# Patient Record
Sex: Female | Born: 1950
Health system: Southern US, Community
[De-identification: ages and names within clinical notes are randomized; demographics above are authoritative.]

## PROBLEM LIST (undated history)

## (undated) DIAGNOSIS — T753XXA Motion sickness, initial encounter: Secondary | ICD-10-CM

## (undated) DIAGNOSIS — K219 Gastro-esophageal reflux disease without esophagitis: Secondary | ICD-10-CM

## (undated) DIAGNOSIS — E785 Hyperlipidemia, unspecified: Secondary | ICD-10-CM

## (undated) DIAGNOSIS — F411 Generalized anxiety disorder: Secondary | ICD-10-CM

## (undated) DIAGNOSIS — E039 Hypothyroidism, unspecified: Secondary | ICD-10-CM

## (undated) DIAGNOSIS — M1990 Unspecified osteoarthritis, unspecified site: Secondary | ICD-10-CM

## (undated) DIAGNOSIS — F419 Anxiety disorder, unspecified: Secondary | ICD-10-CM

## (undated) DIAGNOSIS — I2510 Atherosclerotic heart disease of native coronary artery without angina pectoris: Secondary | ICD-10-CM

## (undated) HISTORY — PX: APPENDECTOMY: SHX54

## (undated) HISTORY — PX: TONSILLECTOMY: SUR1361

## (undated) HISTORY — DX: Atherosclerotic heart disease of native coronary artery without angina pectoris: I25.10

## (undated) HISTORY — PX: JOINT REPLACEMENT: SHX530

## (undated) HISTORY — PX: TUBAL LIGATION: SHX77

## (undated) HISTORY — PX: COLONOSCOPY: SHX5424

---

## 2010-11-01 ENCOUNTER — Ambulatory Visit: Payer: Self-pay | Admitting: Internal Medicine

## 2013-09-21 ENCOUNTER — Ambulatory Visit: Payer: Self-pay | Admitting: Internal Medicine

## 2013-09-28 DIAGNOSIS — E782 Mixed hyperlipidemia: Secondary | ICD-10-CM | POA: Insufficient documentation

## 2013-11-22 DIAGNOSIS — E669 Obesity, unspecified: Secondary | ICD-10-CM | POA: Insufficient documentation

## 2013-11-22 DIAGNOSIS — G4762 Sleep related leg cramps: Secondary | ICD-10-CM | POA: Insufficient documentation

## 2015-08-04 DIAGNOSIS — K219 Gastro-esophageal reflux disease without esophagitis: Secondary | ICD-10-CM | POA: Insufficient documentation

## 2015-10-04 DIAGNOSIS — K8020 Calculus of gallbladder without cholecystitis without obstruction: Secondary | ICD-10-CM | POA: Insufficient documentation

## 2015-11-22 DIAGNOSIS — D229 Melanocytic nevi, unspecified: Secondary | ICD-10-CM | POA: Insufficient documentation

## 2016-02-07 DIAGNOSIS — E039 Hypothyroidism, unspecified: Secondary | ICD-10-CM | POA: Insufficient documentation

## 2016-02-22 HISTORY — PX: JOINT REPLACEMENT: SHX530

## 2016-03-12 DIAGNOSIS — Z96652 Presence of left artificial knee joint: Secondary | ICD-10-CM | POA: Insufficient documentation

## 2017-05-23 DIAGNOSIS — M25562 Pain in left knee: Secondary | ICD-10-CM | POA: Insufficient documentation

## 2017-05-23 DIAGNOSIS — G8929 Other chronic pain: Secondary | ICD-10-CM | POA: Insufficient documentation

## 2017-07-25 ENCOUNTER — Other Ambulatory Visit: Payer: Self-pay | Admitting: Unknown Physician Specialty

## 2017-07-25 DIAGNOSIS — Z96652 Presence of left artificial knee joint: Secondary | ICD-10-CM

## 2017-07-30 ENCOUNTER — Other Ambulatory Visit (HOSPITAL_COMMUNITY): Payer: Self-pay | Admitting: Unknown Physician Specialty

## 2017-07-30 DIAGNOSIS — Z96652 Presence of left artificial knee joint: Secondary | ICD-10-CM

## 2017-08-06 ENCOUNTER — Ambulatory Visit
Admission: RE | Admit: 2017-08-06 | Discharge: 2017-08-06 | Disposition: A | Discharge status: Home / Self Care | Payer: Medicare Other | Source: Ambulatory Visit | Attending: Unknown Physician Specialty | Admitting: Unknown Physician Specialty

## 2017-08-06 ENCOUNTER — Encounter: Payer: Self-pay | Admitting: Radiology

## 2017-08-06 DIAGNOSIS — M949 Disorder of cartilage, unspecified: Secondary | ICD-10-CM | POA: Insufficient documentation

## 2017-08-06 DIAGNOSIS — Z96652 Presence of left artificial knee joint: Secondary | ICD-10-CM | POA: Insufficient documentation

## 2017-08-06 DIAGNOSIS — M25462 Effusion, left knee: Secondary | ICD-10-CM | POA: Diagnosis not present

## 2019-03-11 LAB — HM DEXA SCAN

## 2019-03-16 LAB — HM MAMMOGRAPHY

## 2019-06-04 DIAGNOSIS — F411 Generalized anxiety disorder: Secondary | ICD-10-CM | POA: Insufficient documentation

## 2019-06-04 DIAGNOSIS — F419 Anxiety disorder, unspecified: Secondary | ICD-10-CM | POA: Insufficient documentation

## 2020-01-03 DIAGNOSIS — R011 Cardiac murmur, unspecified: Secondary | ICD-10-CM | POA: Insufficient documentation

## 2020-04-18 LAB — BASIC METABOLIC PANEL
BUN: 19 (ref 4–21)
CO2: 28 — AB (ref 13–22)
Chloride: 105 (ref 99–108)
Creatinine: 1 (ref 0.5–1.1)
Glucose: 96
Potassium: 4.4 (ref 3.4–5.3)
Sodium: 138 (ref 137–147)

## 2020-04-18 LAB — LIPID PANEL
Cholesterol: 194 (ref 0–200)
HDL: 69 (ref 35–70)
LDL Cholesterol: 106
Triglycerides: 93 (ref 40–160)

## 2020-04-18 LAB — HEPATIC FUNCTION PANEL
ALT: 18 (ref 7–35)
AST: 22 (ref 13–35)
Alkaline Phosphatase: 67 (ref 25–125)
Bilirubin, Total: 0.6

## 2020-04-18 LAB — COMPREHENSIVE METABOLIC PANEL: GFR calc non Af Amer: 56

## 2020-04-18 LAB — TSH: TSH: 3.6 (ref 0.41–5.90)

## 2020-06-02 ENCOUNTER — Other Ambulatory Visit: Payer: Self-pay

## 2020-06-02 ENCOUNTER — Ambulatory Visit
Admission: EM | Admit: 2020-06-02 | Discharge: 2020-06-02 | Disposition: A | Discharge status: Home / Self Care | Payer: Medicare Other

## 2020-06-02 DIAGNOSIS — H9202 Otalgia, left ear: Secondary | ICD-10-CM | POA: Diagnosis not present

## 2020-06-02 HISTORY — DX: Hyperlipidemia, unspecified: E78.5

## 2020-06-02 NOTE — ED Provider Notes (Signed)
Roderic Palau    CSN: [id number] Arrival date & time: 06/02/20  1322      History   Chief Complaint Chief Complaint  Patient presents with  . Otalgia    HPI Martha Walsh is a 69 y.o. female.   Patient presents with 4-day history of left ear pain.  She states she had cold symptoms earlier in the week but these have resolved.  She denies fever, chills, sore throat, cough, shortness of breath, vomiting, diarrhea, or other symptoms.  Treatment attempted at home with Tylenol.  The history is provided by the patient.    Past Medical History:  Diagnosis Date  . Hyperlipidemia     There are no problems to display for this patient.   Past Surgical History:  Procedure Laterality Date  . APPENDECTOMY    . JOINT REPLACEMENT    . TONSILLECTOMY      OB History   No obstetric history on file.      Home Medications    Prior to Admission medications   Not on File    Family History History reviewed. No pertinent family history.  Social History Social History   Tobacco Use  . Smoking status: Former Research scientist (life sciences)  . Smokeless tobacco: Never Used  Vaping Use  . Vaping Use: Never used  Substance Use Topics  . Alcohol use: Yes    Comment: occasionally  . Drug use: Not on file     Allergies   Amoxicillin-pot clavulanate and Lisinopril   Review of Systems Review of Systems  Constitutional: Negative for chills and fever.  HENT: Positive for ear pain. Negative for sore throat.   Eyes: Negative for pain and visual disturbance.  Respiratory: Negative for cough and shortness of breath.   Cardiovascular: Negative for chest pain and palpitations.  Gastrointestinal: Negative for abdominal pain, diarrhea and vomiting.  Genitourinary: Negative for dysuria and hematuria.  Musculoskeletal: Negative for arthralgias and back pain.  Skin: Negative for color change and rash.  Neurological: Negative for seizures and syncope.  All other systems reviewed and are  negative.    Physical Exam Triage Vital Signs ED Triage Vitals  Enc Vitals Group     BP      Pulse      Resp      Temp      Temp src      SpO2      Weight      Height      Head Circumference      Peak Flow      Pain Score      Pain Loc      Pain Edu?      Excl. in [place]?    No data found.  Updated Vital Signs BP 117/71   Pulse 64   Temp 98.3 F (36.8 C)   Resp 16   SpO2 96%   Visual Acuity Right Eye Distance:   Left Eye Distance:   Bilateral Distance:    Right Eye Near:   Left Eye Near:    Bilateral Near:     Physical Exam Vitals and nursing note reviewed.  Constitutional:      General: She is not in acute distress.    Appearance: She is well-developed.  HENT:     Head: Normocephalic and atraumatic.     Right Ear: Tympanic membrane and ear canal normal.     Left Ear: Tympanic membrane and ear canal normal.     Nose: Nose normal.  Mouth/Throat:     Mouth: Mucous membranes are moist.     Pharynx: Oropharynx is clear.  Eyes:     Conjunctiva/sclera: Conjunctivae normal.  Cardiovascular:     Rate and Rhythm: Normal rate and regular rhythm.     Heart sounds: No murmur heard.   Pulmonary:     Effort: Pulmonary effort is normal. No respiratory distress.     Breath sounds: Normal breath sounds.  Abdominal:     Palpations: Abdomen is soft.     Tenderness: There is no abdominal tenderness. There is no guarding or rebound.  Musculoskeletal:     Cervical back: Neck supple.  Skin:    General: Skin is warm and dry.     Findings: No rash.  Neurological:     General: No focal deficit present.     Mental Status: She is alert and oriented to person, place, and time.     Gait: Gait normal.  Psychiatric:        Mood and Affect: Mood normal.        Behavior: Behavior normal.      UC Treatments / Results  Labs (all labs ordered are listed, but only abnormal results are displayed) Labs Reviewed - No data to display  EKG   Radiology No results  found.  Procedures Procedures (including critical care time)  Medications Ordered in UC Medications - No data to display  Initial Impression / Assessment and Plan / UC Course  I have reviewed the triage vital signs and the nursing notes.  Pertinent labs & imaging results that were available during my care of the patient were reviewed by me and considered in my medical decision making (see chart for details).   Left otalgia.  Instructed patient to take Tylenol and Mucinex as needed for her symptoms.  Education provided on otalgia and eustachian tube dysfunction.  Instructed patient to follow-up with her PCP if her symptoms are not improving.  Patient agrees to plan of care.   Final Clinical Impressions(s) / UC Diagnoses   Final diagnoses:  Acute otalgia, left     Discharge Instructions     Take Mucinex as discussed.    Follow up with your primary care provider if your symptoms are not improving.       ED Prescriptions    None     PDMP not reviewed this encounter.   Sharion Balloon, NP 06/02/20 1344

## 2020-06-02 NOTE — ED Triage Notes (Signed)
Patient complains of left ear pain x4 days. Reports it only hurts when she is chewing or opens her mouth too wide.

## 2020-06-02 NOTE — Discharge Instructions (Signed)
Take Mucinex as discussed.    Follow up with your primary care provider if your symptoms are not improving.

## 2020-07-11 ENCOUNTER — Other Ambulatory Visit: Payer: Self-pay

## 2020-07-12 ENCOUNTER — Encounter: Payer: Self-pay | Admitting: Gastroenterology

## 2020-07-12 ENCOUNTER — Other Ambulatory Visit: Payer: Self-pay

## 2020-07-12 ENCOUNTER — Ambulatory Visit: Payer: Medicare Other | Admitting: Gastroenterology

## 2020-07-12 VITALS — BP 134/74 | HR 56 | Ht 64.0 in | Wt 172.8 lb

## 2020-07-12 DIAGNOSIS — K219 Gastro-esophageal reflux disease without esophagitis: Secondary | ICD-10-CM | POA: Diagnosis not present

## 2020-07-12 MED ORDER — PANTOPRAZOLE SODIUM 40 MG PO TBEC: 40.0000 mg | DELAYED_RELEASE_TABLET | Freq: Every day | ORAL | 6 refills | Status: DC

## 2020-07-12 NOTE — H&P (View-Only) (Signed)
Gastroenterology Consultation  Referring Provider:     Gale Journey, MD Primary Care Physician:  Gale Journey, MD Primary Gastroenterologist:  Dr. Allen Norris     Reason for Consultation:     GERD        HPI:   Martha Walsh is a 69 y.o. y/o female referred for consultation & management of GERD by Dr. Gale Journey, MD.  This patient comes to see me today after having a colonoscopy at [facility] back in 2019.  The patient also had a referral sent to Dr. Vira Agar back in July at [facility] but Dr. Vira Agar had already retired by that time.  The patient was then sent to [place] for evaluation of her GERD.  The patient's colonoscopy at [facility] in 2019 did not show any polyps but did show a tortuous colon.  The patient is reported to be taking omeprazole 20 mg a day for her heartburn.  At her primary care provider's office visit she was told to avoid trigger foods for her heartburn and to eat slowly. This has been of minimal help.  Past Medical History:  Diagnosis Date  . Hyperlipidemia     Past Surgical History:  Procedure Laterality Date  . APPENDECTOMY    . JOINT REPLACEMENT    . TONSILLECTOMY      Prior to Admission medications   Medication Sig Start Date End Date Taking? Authorizing Provider  amoxicillin (AMOXIL) 500 MG capsule Take 4 capsules (2000 mg) by mouth 1 hour prior to dental appointment. 02/18/17   [provider]  aspirin 81 MG EC tablet Take by mouth. 04/15/17   [provider]  azelastine (ASTELIN) 0.1 % nasal spray 1 spray into each nostril Two (2) times a day. Use in each nostril as directed 06/08/20 06/08/21  [provider]  Black Pepper-Turmeric (TURMERIC COMPLEX/BLACK PEPPER) 3-500 MG CAPS Take 1 capsule by mouth daily.    [provider]  Calcium Carbonate-Vitamin D 600-400 MG-UNIT tablet Take by mouth. 09/29/12   [provider]  Cholecalciferol 25 MCG (1000 UT) tablet Take by mouth.    [provider]  citalopram  (CELEXA) 10 MG tablet Take 10 mg by mouth daily. 05/14/20   [provider]  clonazePAM (KLONOPIN) 0.5 MG tablet Take by mouth. 03/03/19   [provider]  ezetimibe (ZETIA) 10 MG tablet Take 10 mg by mouth daily. 03/09/20   [provider]  ibuprofen (ADVIL) 600 MG tablet Take by mouth. 06/08/20 06/08/21  [provider]  levothyroxine (SYNTHROID) 25 MCG tablet Take by mouth. 04/03/20   [provider]  Omega-3 1000 MG CAPS Take by mouth.    [provider]  omeprazole (PRILOSEC) 20 MG capsule Take by mouth. 03/29/10   [provider]  potassium chloride (KLOR-CON) 10 MEQ tablet Take by mouth.    [provider]  pravastatin (PRAVACHOL) 80 MG tablet SMARTSIG:1 Tablet(s) By Mouth Every Evening 02/28/20   [provider]    No family history on file.   Social History   Tobacco Use  . Smoking status: Former Research scientist (life sciences)  . Smokeless tobacco: Never Used  Vaping Use  . Vaping Use: Never used  Substance Use Topics  . Alcohol use: Yes    Comment: occasionally  . Drug use: Not on file    Allergies as of 07/12/2020 - Review Complete 06/02/2020  Allergen Reaction Noted  . Amoxicillin-pot clavulanate Other (See Comments) 11/22/2013  . Lisinopril Cough 09/10/2016    Review of  Systems:    All systems reviewed and negative except where noted in HPI.   Physical Exam:  There were no vitals taken for this visit. No LMP recorded. Patient is postmenopausal. General:   Alert,  Well-developed, well-nourished, pleasant and cooperative in NAD Head:  Normocephalic and atraumatic. Eyes:  Sclera clear, no icterus.   Conjunctiva pink. Ears:  Normal auditory acuity. Neck:  Supple; no masses or thyromegaly. Lungs:  Respirations even and unlabored.  Clear throughout to auscultation.   No wheezes, crackles, or rhonchi. No acute distress. Heart:  Regular rate and rhythm; no murmurs, clicks, rubs, or gallops. Abdomen:  Normal bowel sounds.   No bruits.  Soft, non-tender and non-distended without masses, hepatosplenomegaly or hernias noted.  No guarding or rebound tenderness.  Negative Carnett sign.   Rectal:  Deferred.  Pulses:  Normal pulses noted. Extremities:  No clubbing or edema.  No cyanosis. Neurologic:  Alert and oriented x3;  grossly normal neurologically. Skin:  Intact without significant lesions or rashes.  No jaundice. Lymph Nodes:  No significant cervical adenopathy. Psych:  Alert and cooperative. Normal mood and affect.  Imaging Studies: No results found.  Assessment and Plan:   Martha Walsh is a 69 y.o. y/o female who comes in with GERD not being controlled by omeprazole. The patient denies any worry symptoms.due to the long standing heart burn and acid breakthrough. The patient will be set up for an EGD and she will also be started on Protonix. The patient has been explained the plan and agrees with it.    Lucilla Lame, MD. Marval Regal    Note: This dictation was prepared with Dragon dictation along with smaller phrase technology. Any transcriptional errors that result from this process are unintentional.

## 2020-07-12 NOTE — Progress Notes (Addendum)
Gastroenterology Consultation  Referring Provider:     Gale Journey, MD Primary Care Physician:  Gale Journey, MD Primary Gastroenterologist:  Dr. Allen Norris     Reason for Consultation:     GERD        HPI:   Martha Walsh is a 69 y.o. y/o female referred for consultation & management of GERD by Dr. Gale Journey, MD.  This patient comes to see me today after having a colonoscopy at [facility] back in 2019.  The patient also had a referral sent to Dr. Vira Agar back in July at [facility] but Dr. Vira Agar had already retired by that time.  The patient was then sent to [place] for evaluation of her GERD.  The patient's colonoscopy at [facility] in 2019 did not show any polyps but did show a tortuous colon.  The patient is reported to be taking omeprazole 20 mg a day for her heartburn.  At her primary care provider's office visit she was told to avoid trigger foods for her heartburn and to eat slowly. This has been of minimal help.  Past Medical History:  Diagnosis Date  . Hyperlipidemia     Past Surgical History:  Procedure Laterality Date  . APPENDECTOMY    . JOINT REPLACEMENT    . TONSILLECTOMY      Prior to Admission medications   Medication Sig Start Date End Date Taking? Authorizing Provider  amoxicillin (AMOXIL) 500 MG capsule Take 4 capsules (2000 mg) by mouth 1 hour prior to dental appointment. 02/18/17   [provider]  aspirin 81 MG EC tablet Take by mouth. 04/15/17   [provider]  azelastine (ASTELIN) 0.1 % nasal spray 1 spray into each nostril Two (2) times a day. Use in each nostril as directed 06/08/20 06/08/21  [provider]  Black Pepper-Turmeric (TURMERIC COMPLEX/BLACK PEPPER) 3-500 MG CAPS Take 1 capsule by mouth daily.    [provider]  Calcium Carbonate-Vitamin D 600-400 MG-UNIT tablet Take by mouth. 09/29/12   [provider]  Cholecalciferol 25 MCG (1000 UT) tablet Take by mouth.    [provider]  citalopram  (CELEXA) 10 MG tablet Take 10 mg by mouth daily. 05/14/20   [provider]  clonazePAM (KLONOPIN) 0.5 MG tablet Take by mouth. 03/03/19   [provider]  ezetimibe (ZETIA) 10 MG tablet Take 10 mg by mouth daily. 03/09/20   [provider]  ibuprofen (ADVIL) 600 MG tablet Take by mouth. 06/08/20 06/08/21  [provider]  levothyroxine (SYNTHROID) 25 MCG tablet Take by mouth. 04/03/20   [provider]  Omega-3 1000 MG CAPS Take by mouth.    [provider]  omeprazole (PRILOSEC) 20 MG capsule Take by mouth. 03/29/10   [provider]  potassium chloride (KLOR-CON) 10 MEQ tablet Take by mouth.    [provider]  pravastatin (PRAVACHOL) 80 MG tablet SMARTSIG:1 Tablet(s) By Mouth Every Evening 02/28/20   [provider]    No family history on file.   Social History   Tobacco Use  . Smoking status: Former Research scientist (life sciences)  . Smokeless tobacco: Never Used  Vaping Use  . Vaping Use: Never used  Substance Use Topics  . Alcohol use: Yes    Comment: occasionally  . Drug use: Not on file    Allergies as of 07/12/2020 - Review Complete 06/02/2020  Allergen Reaction Noted  . Amoxicillin-pot clavulanate Other (See Comments) 11/22/2013  . Lisinopril Cough 09/10/2016    Review of  Systems:    All systems reviewed and negative except where noted in HPI.   Physical Exam:  There were no vitals taken for this visit. No LMP recorded. Patient is postmenopausal. General:   Alert,  Well-developed, well-nourished, pleasant and cooperative in NAD Head:  Normocephalic and atraumatic. Eyes:  Sclera clear, no icterus.   Conjunctiva pink. Ears:  Normal auditory acuity. Neck:  Supple; no masses or thyromegaly. Lungs:  Respirations even and unlabored.  Clear throughout to auscultation.   No wheezes, crackles, or rhonchi. No acute distress. Heart:  Regular rate and rhythm; no murmurs, clicks, rubs, or gallops. Abdomen:  Normal bowel sounds.   No bruits.  Soft, non-tender and non-distended without masses, hepatosplenomegaly or hernias noted.  No guarding or rebound tenderness.  Negative Carnett sign.   Rectal:  Deferred.  Pulses:  Normal pulses noted. Extremities:  No clubbing or edema.  No cyanosis. Neurologic:  Alert and oriented x3;  grossly normal neurologically. Skin:  Intact without significant lesions or rashes.  No jaundice. Lymph Nodes:  No significant cervical adenopathy. Psych:  Alert and cooperative. Normal mood and affect.  Imaging Studies: No results found.  Assessment and Plan:   Martha Walsh is a 69 y.o. y/o female who comes in with GERD not being controlled by omeprazole. The patient denies any worry symptoms.due to the long standing heart burn and acid breakthrough. The patient will be set up for an EGD and she will also be started on Protonix. The patient has been explained the plan and agrees with it.    Lucilla Lame, MD. Marval Regal    Note: This dictation was prepared with Dragon dictation along with smaller phrase technology. Any transcriptional errors that result from this process are unintentional.

## 2020-07-13 ENCOUNTER — Other Ambulatory Visit: Payer: Self-pay

## 2020-07-13 DIAGNOSIS — K219 Gastro-esophageal reflux disease without esophagitis: Secondary | ICD-10-CM

## 2020-07-20 ENCOUNTER — Encounter: Payer: Self-pay | Admitting: Gastroenterology

## 2020-07-25 ENCOUNTER — Other Ambulatory Visit
Admission: RE | Admit: 2020-07-25 | Discharge: 2020-07-25 | Disposition: A | Discharge status: Home / Self Care | Payer: Medicare Other | Source: Ambulatory Visit | Attending: Gastroenterology | Admitting: Gastroenterology

## 2020-07-25 ENCOUNTER — Other Ambulatory Visit: Payer: Self-pay

## 2020-07-25 DIAGNOSIS — Z01812 Encounter for preprocedural laboratory examination: Secondary | ICD-10-CM | POA: Insufficient documentation

## 2020-07-25 DIAGNOSIS — Z20822 Contact with and (suspected) exposure to covid-19: Secondary | ICD-10-CM | POA: Diagnosis not present

## 2020-07-25 LAB — SARS CORONAVIRUS 2 (TAT 6-24 HRS): SARS Coronavirus 2: NEGATIVE

## 2020-07-26 NOTE — Discharge Instructions (Signed)
General Anesthesia, Adult, Care After This sheet gives you information about how to care for yourself after your procedure. Your health care provider may also give you more specific instructions. If you have problems or questions, contact your health care provider. What can I expect after the procedure? After the procedure, the following side effects are common:  Pain or discomfort at the IV site.  Nausea.  Vomiting.  Sore throat.  Trouble concentrating.  Feeling cold or chills.  Weak or tired.  Sleepiness and fatigue.  Soreness and body aches. These side effects can affect parts of the body that were not involved in surgery. Follow these instructions at home:  For at least 24 hours after the procedure:  Have a responsible adult stay with you. It is important to have someone help care for you until you are awake and alert.  Rest as needed.  Do not: ? Participate in activities in which you could fall or become injured. ? Drive. ? Use heavy machinery. ? Drink alcohol. ? Take sleeping pills or medicines that cause drowsiness. ? Make important decisions or sign legal documents. ? Take care of children on your own. Eating and drinking  Follow any instructions from your health care provider about eating or drinking restrictions.  When you feel hungry, start by eating small amounts of foods that are soft and easy to digest (bland), such as toast. Gradually return to your regular diet.  Drink enough fluid to keep your urine pale yellow.  If you vomit, rehydrate by drinking water, juice, or clear broth. General instructions  If you have sleep apnea, surgery and certain medicines can increase your risk for breathing problems. Follow instructions from your health care provider about wearing your sleep device: ? Anytime you are sleeping, including during daytime naps. ? While taking prescription pain medicines, sleeping medicines, or medicines that make you drowsy.  Return to  your normal activities as told by your health care provider. Ask your health care provider what activities are safe for you.  Take over-the-counter and prescription medicines only as told by your health care provider.  If you smoke, do not smoke without supervision.  Keep all follow-up visits as told by your health care provider. This is important. Contact a health care provider if:  You have nausea or vomiting that does not get better with medicine.  You cannot eat or drink without vomiting.  You have pain that does not get better with medicine.  You are unable to pass urine.  You develop a skin rash.  You have a fever.  You have redness around your IV site that gets worse. Get help right away if:  You have difficulty breathing.  You have chest pain.  You have blood in your urine or stool, or you vomit blood. Summary  After the procedure, it is common to have a sore throat or nausea. It is also common to feel tired.  Have a responsible adult stay with you for the first 24 hours after general anesthesia. It is important to have someone help care for you until you are awake and alert.  When you feel hungry, start by eating small amounts of foods that are soft and easy to digest (bland), such as toast. Gradually return to your regular diet.  Drink enough fluid to keep your urine pale yellow.  Return to your normal activities as told by your health care provider. Ask your health care provider what activities are safe for you. This information is not   intended to replace advice given to you by your health care provider. Make sure you discuss any questions you have with your health care provider. Document Revised: 09/12/2017 Document Reviewed: 04/25/2017 Elsevier Patient Education  2020 Elsevier Inc.  

## 2020-07-27 ENCOUNTER — Encounter
Admission: RE | Disposition: A | Discharge status: Home / Self Care | Payer: Self-pay | Source: Home / Self Care | Attending: Gastroenterology

## 2020-07-27 ENCOUNTER — Encounter: Payer: Self-pay | Admitting: Gastroenterology

## 2020-07-27 ENCOUNTER — Other Ambulatory Visit: Payer: Self-pay

## 2020-07-27 ENCOUNTER — Ambulatory Visit: Payer: Medicare Other | Admitting: Anesthesiology

## 2020-07-27 ENCOUNTER — Ambulatory Visit
Admission: RE | Admit: 2020-07-27 | Discharge: 2020-07-27 | Disposition: A | Discharge status: Home / Self Care | Payer: Medicare Other | Attending: Gastroenterology | Admitting: Gastroenterology

## 2020-07-27 DIAGNOSIS — Z87891 Personal history of nicotine dependence: Secondary | ICD-10-CM | POA: Insufficient documentation

## 2020-07-27 DIAGNOSIS — R12 Heartburn: Secondary | ICD-10-CM | POA: Diagnosis present

## 2020-07-27 DIAGNOSIS — K219 Gastro-esophageal reflux disease without esophagitis: Secondary | ICD-10-CM | POA: Diagnosis not present

## 2020-07-27 DIAGNOSIS — Z7982 Long term (current) use of aspirin: Secondary | ICD-10-CM | POA: Diagnosis not present

## 2020-07-27 DIAGNOSIS — Z79899 Other long term (current) drug therapy: Secondary | ICD-10-CM | POA: Diagnosis not present

## 2020-07-27 HISTORY — DX: Hypothyroidism, unspecified: E03.9

## 2020-07-27 HISTORY — DX: Gastro-esophageal reflux disease without esophagitis: K21.9

## 2020-07-27 HISTORY — PX: ESOPHAGOGASTRODUODENOSCOPY (EGD) WITH PROPOFOL: SHX5813

## 2020-07-27 SURGERY — ESOPHAGOGASTRODUODENOSCOPY (EGD) WITH PROPOFOL
Anesthesia: General

## 2020-07-27 MED ORDER — SODIUM CHLORIDE 0.9 % IV SOLN: INTRAVENOUS | Status: DC

## 2020-07-27 MED ORDER — PROPOFOL 10 MG/ML IV BOLUS
INTRAVENOUS | Status: DC | PRN
  Administered 2020-07-27 (×2): 60 mg via INTRAVENOUS

## 2020-07-27 MED ORDER — ACETAMINOPHEN 325 MG PO TABS: 325.0000 mg | ORAL_TABLET | ORAL | Status: DC | PRN

## 2020-07-27 MED ORDER — LIDOCAINE HCL (CARDIAC) PF 100 MG/5ML IV SOSY
PREFILLED_SYRINGE | INTRAVENOUS | Status: DC | PRN
  Administered 2020-07-27: 50 mg via INTRAVENOUS

## 2020-07-27 MED ORDER — ACETAMINOPHEN 160 MG/5ML PO SOLN: 325.0000 mg | ORAL | Status: DC | PRN

## 2020-07-27 MED ORDER — LACTATED RINGERS IV SOLN: INTRAVENOUS | Status: DC

## 2020-07-27 MED ORDER — GLYCOPYRROLATE 0.2 MG/ML IJ SOLN
INTRAMUSCULAR | Status: DC | PRN
  Administered 2020-07-27: .2 mg via INTRAVENOUS

## 2020-07-27 MED ORDER — ONDANSETRON HCL 4 MG/2ML IJ SOLN
4.0000 mg | Freq: Once | INTRAMUSCULAR | Status: AC | PRN
  Administered 2020-07-27: 4 mg via INTRAVENOUS

## 2020-07-27 SURGICAL SUPPLY — 19 items
BALLN DILATOR 10-12 8 (BALLOONS)
BALLN DILATOR 12-15 8 (BALLOONS)
BALLN DILATOR 15-18 8 (BALLOONS)
BALLN DILATOR CRE 0-12 8 (BALLOONS)
BALLN DILATOR ESOPH 8 10 CRE (MISCELLANEOUS) IMPLANT
BALLOON DILATOR 12-15 8 (BALLOONS) IMPLANT
BALLOON DILATOR 15-18 8 (BALLOONS) IMPLANT
BALLOON DILATOR CRE 0-12 8 (BALLOONS) IMPLANT
BLOCK BITE 60FR ADLT L/F GRN (MISCELLANEOUS) ×2 IMPLANT
FCP ESCP3.2XJMB 240X2.8X (MISCELLANEOUS)
FORCEPS BIOP RAD 4 LRG CAP 4 (CUTTING FORCEPS) IMPLANT
FORCEPS BIOP RJ4 240 W/NDL (MISCELLANEOUS)
FORCEPS ESCP3.2XJMB 240X2.8X (MISCELLANEOUS) IMPLANT
GOWN CVR UNV OPN BCK APRN NK (MISCELLANEOUS) ×2 IMPLANT
GOWN ISOL THUMB LOOP REG UNIV (MISCELLANEOUS) ×4
MANIFOLD NEPTUNE II (INSTRUMENTS) ×2 IMPLANT
SYR INFLATION 60ML (SYRINGE) IMPLANT
WATER STERILE IRR 250ML POUR (IV SOLUTION) ×2 IMPLANT
WIRE CRE 18-20MM 8CM F G (MISCELLANEOUS) IMPLANT

## 2020-07-27 NOTE — Anesthesia Postprocedure Evaluation (Signed)
Anesthesia Post Note  Patient: Martha Walsh  Procedure(s) Performed: ESOPHAGOGASTRODUODENOSCOPY (EGD) WITH PROPOFOL (N/A )     Patient location during evaluation: PACU Anesthesia Type: General Level of consciousness: awake Pain management: pain level controlled Vital Signs Assessment: post-procedure vital signs reviewed and stable Respiratory status: respiratory function stable Cardiovascular status: stable Postop Assessment: no signs of nausea or vomiting Anesthetic complications: no   No complications documented.  Veda Canning

## 2020-07-27 NOTE — Interval H&P Note (Signed)
Lucilla Lame, MD [facility] [address], [place],  [address] Phone:[phone] Fax : [phone]  Primary Care Physician:  Gale Journey, MD Primary Gastroenterologist:  Dr. Allen Norris  Pre-Procedure History & Physical: HPI:  Martha Walsh is a 69 y.o. female is here for an endoscopy.   Past Medical History:  Diagnosis Date   GERD (gastroesophageal reflux disease)    Hyperlipidemia    Hypothyroid     Past Surgical History:  Procedure Laterality Date   APPENDECTOMY     JOINT REPLACEMENT     TONSILLECTOMY      Prior to Admission medications   Medication Sig Start Date End Date Taking? Authorizing Provider  aspirin 81 MG EC tablet Take by mouth. 04/15/17  Yes [provider]  azelastine (ASTELIN) 0.1 % nasal spray 1 spray into each nostril Two (2) times a day. Use in each nostril as directed 06/08/20 06/08/21 Yes [provider]  Black Pepper-Turmeric (TURMERIC COMPLEX/BLACK PEPPER) 3-500 MG CAPS Take 1 capsule by mouth daily.   Yes [provider]  Calcium Carbonate-Vitamin D 600-400 MG-UNIT tablet Take by mouth. 09/29/12  Yes [provider]  Cholecalciferol 25 MCG (1000 UT) tablet Take by mouth.   Yes [provider]  citalopram (CELEXA) 10 MG tablet Take 10 mg by mouth daily. 05/14/20  Yes [provider]  clonazePAM (KLONOPIN) 0.5 MG tablet Take by mouth. 03/03/19  Yes [provider]  ezetimibe (ZETIA) 10 MG tablet Take 10 mg by mouth daily. 03/09/20  Yes [provider]  ibuprofen (ADVIL) 600 MG tablet Take by mouth. 06/08/20 06/08/21 Yes [provider]  levothyroxine (SYNTHROID) 25 MCG tablet Take by mouth. 04/03/20  Yes [provider]  pantoprazole (PROTONIX) 40 MG tablet Take 1 tablet (40 mg total) by mouth daily. 07/12/20  Yes Lucilla Lame, MD  potassium chloride (KLOR-CON) 10 MEQ tablet Take by mouth.   Yes [provider]  pravastatin (PRAVACHOL) 80 MG tablet  SMARTSIG:1 Tablet(s) By Mouth Every Evening 02/28/20  Yes [provider]  Omega-3 1000 MG CAPS Take by mouth. Patient not taking: Reported on 07/20/2020    [provider]  omeprazole (PRILOSEC) 20 MG capsule Take by mouth. Patient not taking: Reported on 07/20/2020 03/29/10   [provider]    Allergies as of 07/13/2020 - Review Complete 07/12/2020  Allergen Reaction Noted   Amoxicillin-pot clavulanate Other (See Comments) 11/22/2013   Lisinopril Cough 09/10/2016    History reviewed. No pertinent family history.  Social History   Socioeconomic History   Marital status: Married    Spouse name: Not on file   Number of children: Not on file   Years of education: Not on file   Highest education level: Not on file  Occupational History   Not on file  Tobacco Use   Smoking status: Former Smoker   Smokeless tobacco: Never Used  Scientific laboratory technician Use: Never used  Substance and Sexual Activity   Alcohol use: Yes    Comment: occasionally   Drug use: Not on file   Sexual activity: Not on file  Other Topics Concern   Not on file  Social History Narrative   Not on file   Social Determinants of Health   Financial Resource Strain:    Difficulty of Paying Living Expenses: Not on file  Food Insecurity:    Worried About [place] in the Last Year: Not on file   Ran Out of Food in the  Last Year: Not on file  Transportation Needs:    Lack of Transportation (Medical): Not on file   Lack of Transportation (Non-Medical): Not on file  Physical Activity:    Days of Exercise per Week: Not on file   Minutes of Exercise per Session: Not on file  Stress:    Feeling of Stress : Not on file  Social Connections:    Frequency of Communication with Friends and Family: Not on file   Frequency of Social Gatherings with Friends and Family: Not on file   Attends Religious Services: Not on file   Active Member of Clubs or Organizations: Not on file   Attends  Archivist Meetings: Not on file   Marital Status: Not on file  Intimate Partner Violence:    Fear of Current or Ex-Partner: Not on file   Emotionally Abused: Not on file   Physically Abused: Not on file   Sexually Abused: Not on file    Review of Systems: See HPI, otherwise negative ROS  Physical Exam: BP 123/74   Pulse 79   Temp 97.6 F (36.4 C)   Ht 5\' 4"  (1.626 m)   Wt 78 kg   SpO2 97%   BMI 29.52 kg/m  General:   Alert,  pleasant and cooperative in NAD Head:  Normocephalic and atraumatic. Neck:  Supple; no masses or thyromegaly. Lungs:  Clear throughout to auscultation.    Heart:  Regular rate and rhythm. Abdomen:  Soft, nontender and nondistended. Normal bowel sounds, without guarding, and without rebound.   Neurologic:  Alert and  oriented x4;  grossly normal neurologically.  Impression/Plan: Martha Walsh is here for an endoscopy to be performed for GERD  Risks, benefits, limitations, and alternatives regarding  endoscopy have been reviewed with the patient.  Questions have been answered.  All parties agreeable.   Lucilla Lame, MD  07/27/2020, 9:33 AM

## 2020-07-27 NOTE — Anesthesia Procedure Notes (Signed)
Procedure Name: MAC Date/Time: 07/27/2020 10:16 AM Performed by: Silvana Newness, CRNA Pre-anesthesia Checklist: Patient identified, Emergency Drugs available, Suction available, Patient being monitored and Timeout performed Patient Re-evaluated:Patient Re-evaluated prior to induction Oxygen Delivery Method: Nasal cannula Placement Confirmation: positive ETCO2

## 2020-07-27 NOTE — Op Note (Signed)
[facility] Patient Name: Martha Walsh Procedure Date: 07/27/2020 10:05 AM MRN: [id number] Account #: [id number] Date of Birth: 1951-07-10 Admit Type: Outpatient Age: 69 Room: [facility] OR ROOM 01 Gender: Female Note Status: Finalized Procedure:             Upper GI endoscopy Indications:           Suspected gastro-esophageal reflux disease Providers:             Lucilla Lame MD, MD Medicines:             Propofol per Anesthesia Complications:         No immediate complications. Procedure:             Pre-Anesthesia Assessment:                        - Prior to the procedure, a History and Physical was                         performed, and patient medications and allergies were                         reviewed. The patient's tolerance of previous                         anesthesia was also reviewed. The risks and benefits                         of the procedure and the sedation options and risks                         were discussed with the patient. All questions were                         answered, and informed consent was obtained. Prior                         Anticoagulants: The patient has taken no previous                         anticoagulant or antiplatelet agents. ASA Grade                         Assessment: II - A patient with mild systemic disease.                         After reviewing the risks and benefits, the patient                         was deemed in satisfactory condition to undergo the                         procedure.                        After obtaining informed consent, the endoscope was                         passed under direct vision. Throughout the procedure,  the patient's blood pressure, pulse, and oxygen                         saturations were monitored continuously. The was                         introduced through the mouth, and advanced to the                         second part of  duodenum. The upper GI endoscopy was                         accomplished without difficulty. The patient tolerated                         the procedure well. Findings:      The examined esophagus was normal.      The stomach was normal.      The examined duodenum was normal. Impression:            - Normal esophagus.                        - Normal stomach.                        - Normal examined duodenum.                        - No specimens collected. Recommendation:        - Discharge patient to home.                        - Resume previous diet.                        - Continue present medications. Procedure Code(s):     --- Professional ---                        [phone], Esophagogastroduodenoscopy, flexible,                         transoral; diagnostic, including collection of                         specimen(s) by brushing or washing, when performed                         (separate procedure) CPT copyright 2019 American Medical Association. All rights reserved. The codes documented in this report are preliminary and upon coder review may  be revised to meet current compliance requirements. Lucilla Lame MD, MD 07/27/2020 53:61:44 AM This report has been signed electronically. Number of Addenda: 0 Note Initiated On: 07/27/2020 10:05 AM Total Procedure Duration: 0 hours 2 minutes 23 seconds  Estimated Blood Loss:  Estimated blood loss: none.      [facility]

## 2020-07-27 NOTE — Transfer of Care (Signed)
Immediate Anesthesia Transfer of Care Note  Patient: Martha Walsh  Procedure(s) Performed: ESOPHAGOGASTRODUODENOSCOPY (EGD) WITH PROPOFOL (N/A )  Patient Location: PACU  Anesthesia Type: General  Level of Consciousness: awake, alert  and patient cooperative  Airway and Oxygen Therapy: Patient Spontanous Breathing and Patient connected to supplemental oxygen  Post-op Assessment: Post-op Vital signs reviewed, Patient's Cardiovascular Status Stable, Respiratory Function Stable, Patent Airway and No signs of Nausea or vomiting  Post-op Vital Signs: Reviewed and stable  Complications: No complications documented.

## 2020-07-27 NOTE — Anesthesia Preprocedure Evaluation (Signed)
Anesthesia Evaluation  Patient identified by MRN, date of birth, ID band Patient awake    Reviewed: Allergy & Precautions, NPO status   Airway Mallampati: II  TM Distance: >3 FB     Dental   Pulmonary former smoker,    breath sounds clear to auscultation       Cardiovascular  Rhythm:Regular Rate:Normal  HLD   Neuro/Psych Anxiety    GI/Hepatic GERD  ,  Endo/Other  Hypothyroidism   Renal/GU      Musculoskeletal   Abdominal   Peds  Hematology   Anesthesia Other Findings   Reproductive/Obstetrics                             Anesthesia Physical Anesthesia Plan  ASA: II  Anesthesia Plan: General   Post-op Pain Management:    Induction: Intravenous  PONV Risk Score and Plan: Propofol infusion, TIVA and Treatment may vary due to age or medical condition  Airway Management Planned: Natural Airway and Nasal Cannula  Additional Equipment:   Intra-op Plan:   Post-operative Plan:   Informed Consent: I have reviewed the patients History and Physical, chart, labs and discussed the procedure including the risks, benefits and alternatives for the proposed anesthesia with the patient or authorized representative who has indicated his/her understanding and acceptance.       Plan Discussed with: CRNA  Anesthesia Plan Comments:         Anesthesia Quick Evaluation

## 2020-07-28 ENCOUNTER — Encounter: Payer: Self-pay | Admitting: Gastroenterology

## 2020-07-29 ENCOUNTER — Other Ambulatory Visit: Payer: Self-pay

## 2020-07-29 ENCOUNTER — Emergency Department (HOSPITAL_COMMUNITY)
Admission: EM | Admit: 2020-07-29 | Discharge: 2020-07-29 | Disposition: A | Discharge status: Home / Self Care | Payer: Medicare Other | Attending: Emergency Medicine | Admitting: Emergency Medicine

## 2020-07-29 ENCOUNTER — Emergency Department (HOSPITAL_COMMUNITY): Payer: Medicare Other

## 2020-07-29 DIAGNOSIS — W228XXA Striking against or struck by other objects, initial encounter: Secondary | ICD-10-CM | POA: Diagnosis not present

## 2020-07-29 DIAGNOSIS — E039 Hypothyroidism, unspecified: Secondary | ICD-10-CM | POA: Insufficient documentation

## 2020-07-29 DIAGNOSIS — Y9369 Activity, other involving other sports and athletics played as a team or group: Secondary | ICD-10-CM | POA: Insufficient documentation

## 2020-07-29 DIAGNOSIS — Z96652 Presence of left artificial knee joint: Secondary | ICD-10-CM | POA: Insufficient documentation

## 2020-07-29 DIAGNOSIS — S0181XA Laceration without foreign body of other part of head, initial encounter: Secondary | ICD-10-CM | POA: Diagnosis not present

## 2020-07-29 DIAGNOSIS — Z23 Encounter for immunization: Secondary | ICD-10-CM | POA: Diagnosis not present

## 2020-07-29 DIAGNOSIS — Z7982 Long term (current) use of aspirin: Secondary | ICD-10-CM | POA: Insufficient documentation

## 2020-07-29 DIAGNOSIS — Z79899 Other long term (current) drug therapy: Secondary | ICD-10-CM | POA: Diagnosis not present

## 2020-07-29 DIAGNOSIS — Y9289 Other specified places as the place of occurrence of the external cause: Secondary | ICD-10-CM | POA: Insufficient documentation

## 2020-07-29 DIAGNOSIS — S022XXA Fracture of nasal bones, initial encounter for closed fracture: Secondary | ICD-10-CM | POA: Diagnosis not present

## 2020-07-29 DIAGNOSIS — Z87891 Personal history of nicotine dependence: Secondary | ICD-10-CM | POA: Diagnosis not present

## 2020-07-29 DIAGNOSIS — R040 Epistaxis: Secondary | ICD-10-CM | POA: Diagnosis present

## 2020-07-29 MED ORDER — LIDOCAINE HCL (PF) 1 % IJ SOLN
5.0000 mL | Freq: Once | INTRAMUSCULAR | Status: AC
  Administered 2020-07-29: 5 mL
  Filled 2020-07-29: qty 5

## 2020-07-29 MED ORDER — TETANUS-DIPHTH-ACELL PERTUSSIS 5-2.5-18.5 LF-MCG/0.5 IM SUSY
0.5000 mL | PREFILLED_SYRINGE | Freq: Once | INTRAMUSCULAR | Status: AC
  Administered 2020-07-29: 0.5 mL via INTRAMUSCULAR
  Filled 2020-07-29: qty 0.5

## 2020-07-29 NOTE — Discharge Instructions (Signed)
Have the stitches taken out in around 5 days.  You could follow-up with either our ear nose and throat doctors or someone closer to home for the nasal bone fracture.
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
## 2020-07-29 NOTE — ED Triage Notes (Signed)
20 g. Left AC.
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
## 2020-11-29 DIAGNOSIS — M9901 Segmental and somatic dysfunction of cervical region: Secondary | ICD-10-CM | POA: Diagnosis not present

## 2020-11-29 DIAGNOSIS — M9902 Segmental and somatic dysfunction of thoracic region: Secondary | ICD-10-CM | POA: Diagnosis not present

## 2020-11-29 DIAGNOSIS — M5033 Other cervical disc degeneration, cervicothoracic region: Secondary | ICD-10-CM | POA: Diagnosis not present

## 2020-11-29 DIAGNOSIS — M62830 Muscle spasm of back: Secondary | ICD-10-CM | POA: Diagnosis not present

## 2020-11-30 ENCOUNTER — Other Ambulatory Visit: Payer: Self-pay | Admitting: Internal Medicine

## 2020-12-01 ENCOUNTER — Encounter: Payer: Self-pay | Admitting: Internal Medicine

## 2020-12-01 ENCOUNTER — Other Ambulatory Visit: Payer: Self-pay

## 2020-12-01 ENCOUNTER — Ambulatory Visit (INDEPENDENT_AMBULATORY_CARE_PROVIDER_SITE_OTHER): Payer: Medicare Other | Admitting: Internal Medicine

## 2020-12-01 VITALS — BP 100/66 | HR 76 | Temp 98.0°F | Ht 64.0 in | Wt 176.0 lb

## 2020-12-01 DIAGNOSIS — K219 Gastro-esophageal reflux disease without esophagitis: Secondary | ICD-10-CM

## 2020-12-01 DIAGNOSIS — E039 Hypothyroidism, unspecified: Secondary | ICD-10-CM | POA: Diagnosis not present

## 2020-12-01 DIAGNOSIS — E782 Mixed hyperlipidemia: Secondary | ICD-10-CM | POA: Diagnosis not present

## 2020-12-01 DIAGNOSIS — M1711 Unilateral primary osteoarthritis, right knee: Secondary | ICD-10-CM | POA: Diagnosis not present

## 2020-12-01 DIAGNOSIS — Z13820 Encounter for screening for osteoporosis: Secondary | ICD-10-CM

## 2020-12-01 DIAGNOSIS — Z1231 Encounter for screening mammogram for malignant neoplasm of breast: Secondary | ICD-10-CM

## 2020-12-01 DIAGNOSIS — F419 Anxiety disorder, unspecified: Secondary | ICD-10-CM

## 2020-12-01 HISTORY — DX: Unilateral primary osteoarthritis, right knee: M17.11

## 2020-12-01 MED ORDER — EZETIMIBE 10 MG PO TABS: 10.0000 mg | ORAL_TABLET | Freq: Every day | ORAL | 1 refills | Status: DC

## 2020-12-01 MED ORDER — PRAVASTATIN SODIUM 80 MG PO TABS: 80.0000 mg | ORAL_TABLET | Freq: Every day | ORAL | 1 refills | Status: DC

## 2020-12-01 MED ORDER — CITALOPRAM HYDROBROMIDE 10 MG PO TABS: 10.0000 mg | ORAL_TABLET | Freq: Every day | ORAL | 1 refills | Status: DC

## 2020-12-01 MED ORDER — MELOXICAM 15 MG PO TABS
15.0000 mg | ORAL_TABLET | Freq: Every day | ORAL | 1 refills | Status: DC
Start: 2020-12-01 — End: 2021-10-08

## 2020-12-01 NOTE — Progress Notes (Signed)
Date:  12/01/2020   Name:  Martha Walsh   DOB:  01-Nov-1950   MRN:  [id number]   Chief Complaint: Establish Care and Leg Pain (X4 months, Right leg, feels better today, comes and goes, calf area )  Thyroid Problem Presents for follow-up (onset 5 years) visit. Patient reports no anxiety, constipation, depressed mood, diarrhea, leg swelling, palpitations, weight gain or weight loss. The symptoms have been stable. Her past medical history is significant for hyperlipidemia.  Hyperlipidemia This is a chronic problem. The problem is controlled. Pertinent negatives include no chest pain or shortness of breath. Current antihyperlipidemic treatment includes statins. The current treatment provides significant improvement of lipids.  Gastroesophageal Reflux She complains of heartburn. She reports no abdominal pain or no chest pain. This is a recurrent problem. The problem occurs occasionally. Pertinent negatives include no weight loss. She has tried a PPI for the symptoms. Past procedures include an EGD (normal in 07/2020).  Anxiety Presents for follow-up visit. Patient reports no chest pain, depressed mood, dizziness, nervous/anxious behavior, palpitations or shortness of breath. Symptoms occur rarely.   Compliance with medications is 76-100% (celexa daily).  Knee Pain  There was no injury mechanism. The pain is present in the right knee. The quality of the pain is described as aching. The pain has been fluctuating since onset. She has tried NSAIDs for the symptoms.    Lab Results  Component Value Date   CREATININE 1.0 04/18/2020   BUN 19 04/18/2020   NA 138 04/18/2020   K 4.4 04/18/2020   CL 105 04/18/2020   CO2 28 (A) 04/18/2020   Lab Results  Component Value Date   CHOL 194 04/18/2020   HDL 69 04/18/2020   LDLCALC 106 04/18/2020   TRIG 93 04/18/2020   Lab Results  Component Value Date   TSH 3.60 04/18/2020   No results found for: HGBA1C No results found for: WBC, HGB, HCT, MCV,  PLT Lab Results  Component Value Date   ALT 18 04/18/2020   AST 22 04/18/2020   ALKPHOS 67 04/18/2020     Review of Systems  Constitutional: Negative for chills, fever, unexpected weight change, weight gain and weight loss.  HENT: Negative for trouble swallowing.   Respiratory: Negative for chest tightness and shortness of breath.   Cardiovascular: Negative for chest pain, palpitations and leg swelling.  Gastrointestinal: Positive for heartburn. Negative for abdominal pain, blood in stool, constipation and diarrhea.  Musculoskeletal: Positive for arthralgias (both knees) and gait problem.  Neurological: Negative for dizziness, light-headedness and headaches.  Psychiatric/Behavioral: Positive for depression. Negative for dysphoric mood and sleep disturbance. The patient is not nervous/anxious.     Patient Active Problem List   Diagnosis Date Noted  . Primary osteoarthritis of right knee 12/01/2020  . Murmur 01/03/2020  . Anxiety 06/04/2019  . Status post left partial knee replacement 03/12/2016  . Acquired hypothyroidism 02/07/2016  . Atypical mole 11/22/2015  . Gallstone 10/04/2015  . Gastro-esophageal reflux disease without esophagitis 08/04/2015  . Nocturnal leg cramps 11/22/2013  . Obesity (BMI 30-39.9) 11/22/2013  . Mixed hyperlipidemia 09/28/2013    Allergies  Allergen Reactions  . Amoxicillin-Pot Clavulanate Other (See Comments)    Abdominal Pain Abdominal Pain   . Lisinopril Cough    Cough Cough     Past Surgical History:  Procedure Laterality Date  . APPENDECTOMY    . ESOPHAGOGASTRODUODENOSCOPY (EGD) WITH PROPOFOL N/A 07/27/2020   Procedure: ESOPHAGOGASTRODUODENOSCOPY (EGD) WITH PROPOFOL;  Surgeon: Lucilla Lame, MD;  Location:  [facility];  Service: Endoscopy;  Laterality: N/A;  . JOINT REPLACEMENT    . TONSILLECTOMY      Social History   Tobacco Use  . Smoking status: Former Smoker    Quit date: 09/23/1998    Years since quitting: 22.2  .  Smokeless tobacco: Never Used  Vaping Use  . Vaping Use: Never used  Substance Use Topics  . Alcohol use: Yes    Comment: occasionally  . Drug use: Never     Medication list has been reviewed and updated.  Current Meds  Medication Sig  . aspirin 81 MG EC tablet Take by mouth.  Marland Kitchen azelastine (ASTELIN) 0.1 % nasal spray as needed.  . Black Pepper-Turmeric 3-500 MG CAPS Take 1 capsule by mouth daily.  . Calcium Carbonate-Vitamin D 600-400 MG-UNIT tablet Take by mouth.  . Cholecalciferol 25 MCG (1000 UT) tablet Take by mouth.  . clonazePAM (KLONOPIN) 0.5 MG tablet Take by mouth as needed for anxiety.  . cyclobenzaprine (FLEXERIL) 5 MG tablet as needed.  Marland Kitchen levothyroxine (SYNTHROID) 25 MCG tablet Take 50 mcg by mouth.  . pantoprazole (PROTONIX) 40 MG tablet Take 1 tablet (40 mg total) by mouth daily.  . potassium chloride (KLOR-CON) 10 MEQ tablet Take by mouth. Every other day for leg cramps  . [DISCONTINUED] citalopram (CELEXA) 10 MG tablet Take 10 mg by mouth daily.  . [DISCONTINUED] ezetimibe (ZETIA) 10 MG tablet Take 10 mg by mouth daily.  . [DISCONTINUED] meloxicam (MOBIC) 15 MG tablet Take 15 mg by mouth daily.  . [DISCONTINUED] pravastatin (PRAVACHOL) 80 MG tablet SMARTSIG:1 Tablet(s) By Mouth Every Evening    PHQ 2/9 Scores 12/01/2020  PHQ - 2 Score 0  PHQ- 9 Score 0    GAD 7 : Generalized Anxiety Score 12/01/2020  Nervous, Anxious, on Edge 0  Control/stop worrying 0  Worry too much - different things 0  Trouble relaxing 0  Restless 0  Easily annoyed or irritable 0  Afraid - awful might happen 0  Total GAD 7 Score 0    BP Readings from Last 3 Encounters:  12/01/20 100/66  07/29/20 128/71  07/27/20 121/73    Physical Exam Vitals and nursing note reviewed.  Constitutional:      General: She is not in acute distress.    Appearance: Normal appearance. She is well-developed.  HENT:     Head: Normocephalic and atraumatic.  Neck:     Vascular: No carotid bruit.   Cardiovascular:     Rate and Rhythm: Normal rate and regular rhythm.     Pulses: Normal pulses.     Heart sounds: No murmur heard.   Pulmonary:     Effort: Pulmonary effort is normal. No respiratory distress.     Breath sounds: No wheezing or rhonchi.  Musculoskeletal:     Cervical back: Normal range of motion.     Right knee: No effusion. Tenderness (lateral and distal -nodule c/w resolving hematoma) present.     Left knee: No effusion.     Right lower leg: No edema.     Left lower leg: No edema.  Lymphadenopathy:     Cervical: No cervical adenopathy.  Skin:    General: Skin is warm and dry.     Findings: No rash.  Neurological:     Mental Status: She is alert and oriented to person, place, and time.  Psychiatric:        Attention and Perception: Attention normal.  Mood and Affect: Mood normal.        Behavior: Behavior normal.     Wt Readings from Last 3 Encounters:  12/01/20 176 lb (79.8 kg)  07/29/20 170 lb (77.1 kg)  07/27/20 172 lb (78 kg)    BP 100/66   Pulse 76   Temp 98 F (36.7 C) (Oral)   Ht 5\' 4"  (1.626 m)   Wt 176 lb (79.8 kg)   SpO2 95%   BMI 30.21 kg/m   Assessment and Plan: 1. Acquired hypothyroidism Supplemented - taking 50 mcg M-Sat, 25 mcg Sunday - TSH + free T4  2. Mixed hyperlipidemia On statin and zetia Will obtain labs next visit - Comprehensive metabolic panel - ezetimibe (ZETIA) 10 MG tablet; Take 1 tablet (10 mg total) by mouth daily.  Dispense: 90 tablet; Refill: 1 - pravastatin (PRAVACHOL) 80 MG tablet; Take 1 tablet (80 mg total) by mouth daily.  Dispense: 90 tablet; Refill: 1  3. Gastro-esophageal reflux disease without esophagitis Symptoms well controlled on daily PPI No red flag signs such as weight loss, n/v, melena Will continue pantoprazole  4. Primary osteoarthritis of right knee S/p left partial knee replacement with OA right knee - meloxicam (MOBIC) 15 MG tablet; Take 1 tablet (15 mg total) by mouth daily.   Dispense: 90 tablet; Refill: 1  5. Encounter for screening mammogram for breast cancer Schedule at [facility]; Future  6. Encounter for screening for osteoporosis - [facility] Bone Density; Future  7. Anxiety Doing well without depression or anxiety Will continue Celexa - citalopram (CELEXA) 10 MG tablet; Take 1 tablet (10 mg total) by mouth daily.  Dispense: 90 tablet; Refill: 1   Partially dictated using [employer]. Any errors are unintentional.  Halina Maidens, MD [facility]  12/01/2020

## 2020-12-04 DIAGNOSIS — Z85828 Personal history of other malignant neoplasm of skin: Secondary | ICD-10-CM | POA: Diagnosis not present

## 2020-12-04 DIAGNOSIS — D229 Melanocytic nevi, unspecified: Secondary | ICD-10-CM | POA: Diagnosis not present

## 2020-12-04 DIAGNOSIS — L821 Other seborrheic keratosis: Secondary | ICD-10-CM | POA: Diagnosis not present

## 2020-12-04 DIAGNOSIS — L820 Inflamed seborrheic keratosis: Secondary | ICD-10-CM | POA: Diagnosis not present

## 2020-12-07 DIAGNOSIS — E782 Mixed hyperlipidemia: Secondary | ICD-10-CM | POA: Diagnosis not present

## 2020-12-07 DIAGNOSIS — E039 Hypothyroidism, unspecified: Secondary | ICD-10-CM | POA: Diagnosis not present

## 2020-12-08 LAB — COMPREHENSIVE METABOLIC PANEL
ALT: 17 IU/L (ref 0–32)
AST: 23 IU/L (ref 0–40)
Albumin/Globulin Ratio: 2 (ref 1.2–2.2)
Albumin: 4.6 g/dL (ref 3.7–4.7)
Alkaline Phosphatase: 68 IU/L (ref 44–121)
BUN/Creatinine Ratio: 28 (ref 12–28)
BUN: 27 mg/dL (ref 8–27)
Bilirubin Total: 0.4 mg/dL (ref 0.0–1.2)
CO2: 21 mmol/L (ref 20–29)
Calcium: 9.4 mg/dL (ref 8.7–10.3)
Chloride: 102 mmol/L (ref 96–106)
Creatinine, Ser: 0.97 mg/dL (ref 0.57–1.00)
Globulin, Total: 2.3 g/dL (ref 1.5–4.5)
Glucose: 99 mg/dL (ref 65–99)
Potassium: 4.6 mmol/L (ref 3.5–5.2)
Sodium: 138 mmol/L (ref 134–144)
Total Protein: 6.9 g/dL (ref 6.0–8.5)
eGFR: 62 mL/min/{1.73_m2} (ref >59)

## 2020-12-08 LAB — TSH+FREE T4
Free T4: 1.3 ng/dL (ref 0.82–1.77)
TSH: 5.16 u[IU]/mL — ABNORMAL HIGH (ref 0.450–4.500)

## 2020-12-27 DIAGNOSIS — M9901 Segmental and somatic dysfunction of cervical region: Secondary | ICD-10-CM | POA: Diagnosis not present

## 2020-12-27 DIAGNOSIS — M9902 Segmental and somatic dysfunction of thoracic region: Secondary | ICD-10-CM | POA: Diagnosis not present

## 2020-12-27 DIAGNOSIS — M5033 Other cervical disc degeneration, cervicothoracic region: Secondary | ICD-10-CM | POA: Diagnosis not present

## 2020-12-27 DIAGNOSIS — M62830 Muscle spasm of back: Secondary | ICD-10-CM | POA: Diagnosis not present

## 2021-01-24 DIAGNOSIS — M9902 Segmental and somatic dysfunction of thoracic region: Secondary | ICD-10-CM | POA: Diagnosis not present

## 2021-01-24 DIAGNOSIS — M62830 Muscle spasm of back: Secondary | ICD-10-CM | POA: Diagnosis not present

## 2021-01-24 DIAGNOSIS — M5033 Other cervical disc degeneration, cervicothoracic region: Secondary | ICD-10-CM | POA: Diagnosis not present

## 2021-01-24 DIAGNOSIS — M9901 Segmental and somatic dysfunction of cervical region: Secondary | ICD-10-CM | POA: Diagnosis not present

## 2021-02-05 ENCOUNTER — Ambulatory Visit: Payer: Self-pay | Admitting: *Deleted

## 2021-02-05 ENCOUNTER — Telehealth: Payer: Self-pay

## 2021-02-05 NOTE — Telephone Encounter (Signed)
SPOKE TO PATIENT ABOUT THIS MESSAGE
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
## 2021-02-06 NOTE — Telephone Encounter (Signed)
  Reason for Disposition . [1] Face pain or swelling AND [2] present > 7 days  Protocols used: FACE INJURY-A-AH

## 2021-02-14 ENCOUNTER — Other Ambulatory Visit: Payer: Self-pay

## 2021-02-14 ENCOUNTER — Encounter: Payer: Self-pay | Admitting: Internal Medicine

## 2021-02-14 ENCOUNTER — Ambulatory Visit (INDEPENDENT_AMBULATORY_CARE_PROVIDER_SITE_OTHER): Payer: Medicare Other | Admitting: Internal Medicine

## 2021-02-14 VITALS — BP 140/82 | HR 84 | Temp 97.8°F | Ht 64.0 in | Wt 180.0 lb

## 2021-02-14 DIAGNOSIS — M6281 Muscle weakness (generalized): Secondary | ICD-10-CM | POA: Diagnosis not present

## 2021-02-14 DIAGNOSIS — M1812 Unilateral primary osteoarthritis of first carpometacarpal joint, left hand: Secondary | ICD-10-CM | POA: Diagnosis not present
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
## 2021-02-21 DIAGNOSIS — M62830 Muscle spasm of back: Secondary | ICD-10-CM | POA: Diagnosis not present

## 2021-02-21 DIAGNOSIS — M5033 Other cervical disc degeneration, cervicothoracic region: Secondary | ICD-10-CM | POA: Diagnosis not present

## 2021-02-21 DIAGNOSIS — M9901 Segmental and somatic dysfunction of cervical region: Secondary | ICD-10-CM | POA: Diagnosis not present

## 2021-02-21 DIAGNOSIS — M9902 Segmental and somatic dysfunction of thoracic region: Secondary | ICD-10-CM | POA: Diagnosis not present

## 2021-02-27 ENCOUNTER — Telehealth: Payer: Self-pay

## 2021-02-27 NOTE — Telephone Encounter (Signed)
Called and spoke with patient. She already has an order placed from March for both mammo and bone density. She said no one has called her to get this scheduled yet. Reached out to coordinator Parke Poisson since Langley Gauss is out of the office to get this scheduled for patient.

## 2021-02-27 NOTE — Telephone Encounter (Unsigned)
Copied from [place] [id number]. Topic: Referral - Request for Referral >> Feb 27, 2021  1:27 PM Lennox Solders wrote: Has patient seen PCP for this complaint? no Reason for referral: Pt would like mammogram and bone density test done at [facility]. Pt states the last time she had tests done was about 3 yrs ago. Pt did not provide the phone number

## 2021-03-05 ENCOUNTER — Other Ambulatory Visit: Payer: Self-pay | Admitting: Gastroenterology

## 2021-03-05 ENCOUNTER — Ambulatory Visit (INDEPENDENT_AMBULATORY_CARE_PROVIDER_SITE_OTHER): Payer: Medicare Other

## 2021-03-05 DIAGNOSIS — Z0000 Encounter for general adult medical examination without abnormal findings: Secondary | ICD-10-CM | POA: Diagnosis not present

## 2021-03-05 NOTE — Patient Instructions (Signed)
Martha Walsh , Thank you for taking time to come for your Medicare Wellness Visit. I appreciate your ongoing commitment to your health goals. Please review the following plan we discussed and let me know if I can assist you in the future.   Screening recommendations/referrals: Colonoscopy: done 2019. Repeat in 2029 Mammogram: done 03/16/19; scheduled with [facility] Bone Density:  done 03/11/19; scheduled with [facility] Recommended yearly ophthalmology/optometry visit for glaucoma screening and checkup Recommended yearly dental visit for hygiene and checkup  Vaccinations: Influenza vaccine: done 06/07/20 Pneumococcal vaccine: done 01/16/15 Tdap vaccine: done 07/29/20 Shingles vaccine: Shingrix discussed. Please contact your pharmacy for coverage information.  Covid-19:done 11/05/19, 11/26/19 & 09/18/20  Advanced directives: Advance directive discussed with you today. I have provided a copy for you to complete at home and have notarized. Once this is complete please bring a copy in to our office so we can scan it into your chart.   Conditions/risks identified: Keep up the great work!   Next appointment: Follow up in one year for your annual wellness visit    Preventive Care 65 Years and Older, Female Preventive care refers to lifestyle choices and visits with your health care provider that can promote health and wellness. What does preventive care include? A yearly physical exam. This is also called an annual well check. Dental exams once or twice a year. Routine eye exams. Ask your health care provider how often you should have your eyes checked. Personal lifestyle choices, including: Daily care of your teeth and gums. Regular physical activity. Eating a healthy diet. Avoiding tobacco and drug use. Limiting alcohol use. Practicing safe sex. Taking low-dose aspirin every day. Taking vitamin and mineral supplements as recommended by your health care provider. What happens during an annual well  check? The services and screenings done by your health care provider during your annual well check will depend on your age, overall health, lifestyle risk factors, and family history of disease. Counseling  Your health care provider may ask you questions about your: Alcohol use. Tobacco use. Drug use. Emotional well-being. Home and relationship well-being. Sexual activity. Eating habits. History of falls. Memory and ability to understand (cognition). Work and work Statistician. Reproductive health. Screening  You may have the following tests or measurements: Height, weight, and BMI. Blood pressure. Lipid and cholesterol levels. These may be checked every 5 years, or more frequently if you are over 81 years old. Skin check. Lung cancer screening. You may have this screening every year starting at age 34 if you have a 30-pack-year history of smoking and currently smoke or have quit within the past 15 years. Fecal occult blood test (FOBT) of the stool. You may have this test every year starting at age 72. Flexible sigmoidoscopy or colonoscopy. You may have a sigmoidoscopy every 5 years or a colonoscopy every 10 years starting at age 52. Hepatitis C blood test. Hepatitis B blood test. Sexually transmitted disease (STD) testing. Diabetes screening. This is done by checking your blood sugar (glucose) after you have not eaten for a while (fasting). You may have this done every 1-3 years. Bone density scan. This is done to screen for osteoporosis. You may have this done starting at age 28. Mammogram. This may be done every 1-2 years. Talk to your health care provider about how often you should have regular mammograms. Talk with your health care provider about your test results, treatment options, and if necessary, the need for more tests. Vaccines  Your health care provider may recommend  certain vaccines, such as: Influenza vaccine. This is recommended every year. Tetanus, diphtheria, and  acellular pertussis (Tdap, Td) vaccine. You may need a Td booster every 10 years. Zoster vaccine. You may need this after age 62. Pneumococcal 13-valent conjugate (PCV13) vaccine. One dose is recommended after age 46. Pneumococcal polysaccharide (PPSV23) vaccine. One dose is recommended after age 29. Talk to your health care provider about which screenings and vaccines you need and how often you need them. This information is not intended to replace advice given to you by your health care provider. Make sure you discuss any questions you have with your health care provider. Document Released: 10/06/2015 Document Revised: 05/29/2016 Document Reviewed: 07/11/2015 Elsevier Interactive Patient Education  2017 [place] Prevention in the Home Falls can cause injuries. They can happen to people of all ages. There are many things you can do to make your home safe and to help prevent falls. What can I do on the outside of my home? Regularly fix the edges of walkways and driveways and fix any cracks. Remove anything that might make you trip as you walk through a door, such as a raised step or threshold. Trim any bushes or trees on the path to your home. Use bright outdoor lighting. Clear any walking paths of anything that might make someone trip, such as rocks or tools. Regularly check to see if handrails are loose or broken. Make sure that both sides of any steps have handrails. Any raised decks and porches should have guardrails on the edges. Have any leaves, snow, or ice cleared regularly. Use sand or salt on walking paths during winter. Clean up any spills in your garage right away. This includes oil or grease spills. What can I do in the bathroom? Use night lights. Install grab bars by the toilet and in the tub and shower. Do not use towel bars as grab bars. Use non-skid mats or decals in the tub or shower. If you need to sit down in the shower, use a plastic, non-slip stool. Keep  the floor dry. Clean up any water that spills on the floor as soon as it happens. Remove soap buildup in the tub or shower regularly. Attach bath mats securely with double-sided non-slip rug tape. Do not have throw rugs and other things on the floor that can make you trip. What can I do in the bedroom? Use night lights. Make sure that you have a light by your bed that is easy to reach. Do not use any sheets or blankets that are too big for your bed. They should not hang down onto the floor. Have a firm chair that has side arms. You can use this for support while you get dressed. Do not have throw rugs and other things on the floor that can make you trip. What can I do in the kitchen? Clean up any spills right away. Avoid walking on wet floors. Keep items that you use a lot in easy-to-reach places. If you need to reach something above you, use a strong step stool that has a grab bar. Keep electrical cords out of the way. Do not use floor polish or wax that makes floors slippery. If you must use wax, use non-skid floor wax. Do not have throw rugs and other things on the floor that can make you trip. What can I do with my stairs? Do not leave any items on the stairs. Make sure that there are handrails on both sides of the  stairs and use them. Fix handrails that are broken or loose. Make sure that handrails are as long as the stairways. Check any carpeting to make sure that it is firmly attached to the stairs. Fix any carpet that is loose or worn. Avoid having throw rugs at the top or bottom of the stairs. If you do have throw rugs, attach them to the floor with carpet tape. Make sure that you have a light switch at the top of the stairs and the bottom of the stairs. If you do not have them, ask someone to add them for you. What else can I do to help prevent falls? Wear shoes that: Do not have high heels. Have rubber bottoms. Are comfortable and fit you well. Are closed at the toe. Do not  wear sandals. If you use a stepladder: Make sure that it is fully opened. Do not climb a closed stepladder. Make sure that both sides of the stepladder are locked into place. Ask someone to hold it for you, if possible. Clearly mark and make sure that you can see: Any grab bars or handrails. First and last steps. Where the edge of each step is. Use tools that help you move around (mobility aids) if they are needed. These include: Canes. Walkers. Scooters. Crutches. Turn on the lights when you go into a dark area. Replace any light bulbs as soon as they burn out. Set up your furniture so you have a clear path. Avoid moving your furniture around. If any of your floors are uneven, fix them. If there are any pets around you, be aware of where they are. Review your medicines with your doctor. Some medicines can make you feel dizzy. This can increase your chance of falling. Ask your doctor what other things that you can do to help prevent falls. This information is not intended to replace advice given to you by your health care provider. Make sure you discuss any questions you have with your health care provider. Document Released: 07/06/2009 Document Revised: 02/15/2016 Document Reviewed: 10/14/2014 Elsevier Interactive Patient Education  2017 [employer].

## 2021-03-05 NOTE — Progress Notes (Signed)
Subjective:   Martha Walsh is a 70 y.o. female who presents for an Initial Medicare Annual Wellness Visit.  Virtual Visit via Telephone Note  I connected with  Anusha Claus on 03/05/21 at 10:00 AM EDT by telephone and verified that I am speaking with the correct person using two identifiers.  Location: Patient: home Provider: [facility] Persons participating in the virtual visit: [place]   I discussed the limitations, risks, security and privacy concerns of performing an evaluation and management service by telephone and the availability of in person appointments. The patient expressed understanding and agreed to proceed.  Interactive audio and video telecommunications were attempted between this nurse and patient, however failed, due to patient having technical difficulties OR patient did not have access to video capability.  We continued and completed visit with audio only.  Some vital signs may be absent or patient reported.   Clemetine Marker, LPN   Review of Systems     Cardiac Risk Factors include: advanced age (>43men, >63 women);dyslipidemia     Objective:    Today's Vitals   03/05/21 1008  PainSc: 1    There is no height or weight on file to calculate BMI.  Advanced Directives 03/05/2021 07/27/2020  Does Patient Have a Medical Advance Directive? No No  Would patient like information on creating a medical advance directive? Yes (MAU/Ambulatory/Procedural Areas - Information given) No - Patient declined    Current Medications (verified) Outpatient Encounter Medications as of 03/05/2021  Medication Sig   aspirin 81 MG EC tablet Take 81 mg by mouth daily.   azelastine (ASTELIN) 0.1 % nasal spray Place 1 spray into both nostrils as needed.   Black Pepper-Turmeric 3-500 MG CAPS Take 1 capsule by mouth daily.   Calcium Carbonate-Vitamin D 600-400 MG-UNIT tablet Take 1 tablet by mouth daily.   Cholecalciferol 25 MCG (1000 UT) tablet Take 1,000 Units by mouth  daily.   citalopram (CELEXA) 10 MG tablet Take 1 tablet (10 mg total) by mouth daily.   clonazePAM (KLONOPIN) 0.5 MG tablet Take 0.5 mg by mouth as needed for anxiety.   cyclobenzaprine (FLEXERIL) 5 MG tablet Take 5 mg by mouth as needed.   ezetimibe (ZETIA) 10 MG tablet Take 1 tablet (10 mg total) by mouth daily.   levothyroxine (SYNTHROID) 25 MCG tablet Take 50 mcg by mouth daily before breakfast.   meloxicam (MOBIC) 15 MG tablet Take 1 tablet (15 mg total) by mouth daily.   potassium chloride (KLOR-CON) 10 MEQ tablet Take 10 mEq by mouth daily. Every other day for leg cramps   pravastatin (PRAVACHOL) 80 MG tablet Take 1 tablet (80 mg total) by mouth daily.   [DISCONTINUED] pantoprazole (PROTONIX) 40 MG tablet Take 1 tablet (40 mg total) by mouth daily.   No facility-administered encounter medications on file as of 03/05/2021.    Allergies (verified) Amoxicillin-pot clavulanate and Lisinopril   History: Past Medical History:  Diagnosis Date   GERD (gastroesophageal reflux disease)    Hyperlipidemia    Hypothyroid    Past Surgical History:  Procedure Laterality Date   APPENDECTOMY     ESOPHAGOGASTRODUODENOSCOPY (EGD) WITH PROPOFOL N/A 07/27/2020   Procedure: ESOPHAGOGASTRODUODENOSCOPY (EGD) WITH PROPOFOL;  Surgeon: Lucilla Lame, MD;  Location: [place];  Service: Endoscopy;  Laterality: N/A;   JOINT REPLACEMENT     TONSILLECTOMY     Family History  Problem Relation Age of Onset   Heart disease Father    Social History   Socioeconomic History  Marital status: Married    Spouse name: Not on file   Number of children: 3   Years of education: Not on file   Highest education level: Associate degree: academic program  Occupational History   Occupation: retired  Tobacco Use   Smoking status: Former    Pack years: 0.00    Types: Cigarettes    Quit date: 09/23/1998    Years since quitting: 22.4   Smokeless tobacco: Never  Vaping Use   Vaping Use: Never used   Substance and Sexual Activity   Alcohol use: Yes    Comment: occasionally   Drug use: Never   Sexual activity: Not Currently  Other Topics Concern   Not on file  Social History Narrative   Not on file   Social Determinants of Health   Financial Resource Strain: Low Risk    Difficulty of Paying Living Expenses: Not hard at all  Food Insecurity: No Food Insecurity   Worried About Charity fundraiser in the Last Year: Never true   [place] in the Last Year: Never true  Transportation Needs: No Transportation Needs   Lack of Transportation (Medical): No   Lack of Transportation (Non-Medical): No  Physical Activity: Insufficiently Active   Days of Exercise per Week: 4 days   Minutes of Exercise per Session: 30 min  Stress: No Stress Concern Present   Feeling of Stress : Not at all  Social Connections: Socially Integrated   Frequency of Communication with Friends and Family: More than three times a week   Frequency of Social Gatherings with Friends and Family: More than three times a week   Attends Religious Services: More than 4 times per year   Active Member of [employer] or Organizations: Yes   Attends Music therapist: More than 4 times per year   Marital Status: Married    Tobacco Counseling Counseling given: Not Answered   Clinical Intake:  Pre-visit preparation completed: Yes  Pain : 0-10 Pain Score: 1  Pain Type: Chronic pain Pain Location: Knee (fingers) Pain Orientation: Right, Left Pain Descriptors / Indicators: Aching, Sore Pain Onset: More than a month ago Pain Frequency: Constant     Nutritional Risks: None Diabetes: No  How often do you need to have someone help you when you read instructions, pamphlets, or other written materials from your doctor or pharmacy?: 1 - Never    Interpreter Needed?: No  Information entered by :: Clemetine Marker LPN   Activities of Daily Living In your present state of health, do you have any  difficulty performing the following activities: 03/05/2021 02/14/2021  Hearing? N N  Comment declines hearing aids -  Vision? N N  Difficulty concentrating or making decisions? N N  Walking or climbing stairs? N N  Dressing or bathing? N N  Doing errands, shopping? N N  Preparing Food and eating ? N -  Using the Toilet? N -  In the past six months, have you accidently leaked urine? N -  Do you have problems with loss of bowel control? N -  Managing your Medications? N -  Managing your Finances? N -  Housekeeping or managing your Housekeeping? N -  Some recent data might be hidden    Patient Care Team: Glean Hess, MD as PCP - General (Internal Medicine) Lucilla Lame, MD as Consulting Physician (Gastroenterology) Earnestine Leys, MD (Orthopedic Surgery) Baxter Kail, MD (Dermatology)  Indicate any recent Medical Services you may have received from  other than [facility] providers in the past year (date may be approximate).     Assessment:   This is a routine wellness examination for Nafisah.  Hearing/Vision screen Hearing Screening - Comments:: Pt denies hearing difficulty Vision Screening - Comments:: Annual vision screenings at [place] issues and exercise activities discussed: Current Exercise Habits: Home exercise routine, Type of exercise: walking, Time (Minutes): 30, Frequency (Times/Week): 4, Weekly Exercise (Minutes/Week): 120, Intensity: Moderate, Exercise limited by: orthopedic condition(s)   Goals Addressed             This Visit's Progress    Weight (lb) < 160 lb (72.6 kg)       Pt states she would to lose weight over the next year with healthy eating and physical activity         Depression Screen PHQ 2/9 Scores 03/05/2021 02/14/2021 12/01/2020  PHQ - 2 Score 0 0 0  PHQ- 9 Score - 0 0    Fall Risk Fall Risk  03/05/2021 02/14/2021 12/01/2020  Falls in the past year? 0 0 0  Number falls in past yr: 0 0 -  Injury with Fall? 0 0 -  Risk  for fall due to : No Fall Risks No Fall Risks -  Follow up Falls prevention discussed Falls evaluation completed Falls evaluation completed    [place]:  Any stairs in or around the home? Yes  If so, are there any without handrails? No  Home free of loose throw rugs in walkways, pet beds, electrical cords, etc? Yes  Adequate lighting in your home to reduce risk of falls? Yes   ASSISTIVE DEVICES UTILIZED TO PREVENT FALLS:  Life alert? No  Use of a cane, walker or w/c? No  Grab bars in the bathroom? Yes  Shower chair or bench in shower? Yes  Elevated toilet seat or a handicapped toilet? No   TIMED UP AND GO:  Was the test performed? No . Telephonic visit.   Cognitive Function: Normal cognitive status assessed by direct observation by this Nurse Health Advisor. No abnormalities found.          Immunizations Immunization History  Administered Date(s) Administered   Influenza Inj Mdck Quad Pf 06/03/2019   Influenza, High Dose Seasonal PF 06/03/2019   Influenza, Seasonal, Injecte, Preservative Fre 06/06/2010   Influenza,inj,Quad PF,6+ Mos 09/10/2016, 10/14/2017, 06/15/2018   Influenza-Unspecified 06/03/2019, 06/07/2020   PFIZER(Purple Top)SARS-COV-2 Vaccination 11/05/2019, 11/26/2019, 09/18/2020   Pneumococcal Conjugate-13 11/22/2013   Pneumococcal Polysaccharide-23 01/16/2015   Tdap 06/06/2010, 07/29/2020   Zoster, Live 02/07/2016    TDAP status: Up to date  Flu Vaccine status: Up to date  Pneumococcal vaccine status: Up to date  Covid-19 vaccine status: Completed vaccines  Qualifies for Shingles Vaccine? Yes   Zostavax completed Yes   Shingrix Completed?: No.    Education has been provided regarding the importance of this vaccine. Patient has been advised to call insurance company to determine out of pocket expense if they have not yet received this vaccine. Advised may also receive vaccine at local pharmacy or Health Dept.  Verbalized acceptance and understanding.  Screening Tests Health Maintenance  Topic Date Due   Zoster Vaccines- Shingrix (1 of 2) Never done   COVID-19 Vaccine (4 - Booster for Pfizer series) 12/17/2020   Hepatitis C Screening  12/01/2021 (Originally 11/29/1968)   MAMMOGRAM  03/15/2021   INFLUENZA VACCINE  04/23/2021   COLONOSCOPY (Pts 45-90yrs Insurance coverage will need to be  confirmed)  09/24/2027   TETANUS/TDAP  07/29/2030   DEXA SCAN  Completed   PNA vac Low Risk Adult  Completed   HPV VACCINES  Aged Out    Health Maintenance  Health Maintenance Due  Topic Date Due   Zoster Vaccines- Shingrix (1 of 2) Never done   COVID-19 Vaccine (4 - Booster for Pfizer series) 12/17/2020    Colorectal cancer screening: Type of screening: Colonoscopy. Completed 2019. Repeat every 10 years  Mammogram status: Completed 03/16/19. Repeat every year. Scheduled with [facility].   Bone Density status: Completed 03/11/19. Results reflect: Bone density results: OSTEOPENIA. Repeat every 2 years. Scheduled with [facility].   Lung Cancer Screening: (Low Dose CT Chest recommended if Age 64-80 years, 30 pack-year currently smoking OR have quit w/in 15years.) does not qualify.  Additional Screening:  Hepatitis C Screening: does qualify; postponed  Vision Screening: Recommended annual ophthalmology exams for early detection of glaucoma and other disorders of the eye. Is the patient up to date with their annual eye exam?  Yes  Who is the provider or what is the name of the office in which the patient attends annual eye exams? [facility].   Dental Screening: Recommended annual dental exams for proper oral hygiene  Community Resource Referral / Chronic Care Management: CRR required this visit?  No   CCM required this visit?  No      Plan:     I have personally reviewed and noted the following in the patient's chart:   Medical and social history Use of alcohol, tobacco or illicit drugs  Current  medications and supplements including opioid prescriptions. Patient is not currently taking opioid prescriptions. Functional ability and status Nutritional status Physical activity Advanced directives List of other physicians Hospitalizations, surgeries, and ER visits in previous 12 months Vitals Screenings to include cognitive, depression, and falls Referrals and appointments  In addition, I have reviewed and discussed with patient certain preventive protocols, quality metrics, and best practice recommendations. A written personalized care plan for preventive services as well as general preventive health recommendations were provided to patient.     Clemetine Marker, LPN   4/49/2010   Nurse Notes: none

## 2021-03-06 ENCOUNTER — Telehealth: Payer: Self-pay | Admitting: Internal Medicine

## 2021-03-06 NOTE — Telephone Encounter (Signed)
Called to ask the nurse to call regarding a referral for patient.  Please call to discuss at [phone], ext. [phone]
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
## 2021-03-13 DIAGNOSIS — M8589 Other specified disorders of bone density and structure, multiple sites: Secondary | ICD-10-CM | POA: Diagnosis not present

## 2021-03-17 ENCOUNTER — Encounter: Payer: Self-pay | Admitting: Internal Medicine

## 2021-03-17 ENCOUNTER — Telehealth: Payer: Self-pay | Admitting: Internal Medicine

## 2021-03-17 NOTE — Telephone Encounter (Signed)
-----   Message from Glean Hess, MD sent at 03/16/2021  9:44 AM EDT ----- Let patient know that her bone density is normal.  ----- Message ----- From: Karin Golden Sent: 03/16/2021   8:26 AM EDT To: Glean Hess, MD

## 2021-03-17 NOTE — Telephone Encounter (Signed)
Let the patient know that her bone density of the spine is Normal but the bone density is decreased at the hip.  Take calcium and vitamin D daily and exercise as able.

## 2021-03-19 NOTE — Telephone Encounter (Signed)
Patient sent a my chart message requesting DEXA results. Gave her Dr. Gaspar Cola note in a reply to inform patient.

## 2021-03-21 DIAGNOSIS — M9901 Segmental and somatic dysfunction of cervical region: Secondary | ICD-10-CM | POA: Diagnosis not present

## 2021-03-21 DIAGNOSIS — M9902 Segmental and somatic dysfunction of thoracic region: Secondary | ICD-10-CM | POA: Diagnosis not present

## 2021-03-21 DIAGNOSIS — M5033 Other cervical disc degeneration, cervicothoracic region: Secondary | ICD-10-CM | POA: Diagnosis not present

## 2021-03-21 DIAGNOSIS — M62830 Muscle spasm of back: Secondary | ICD-10-CM | POA: Diagnosis not present

## 2021-03-28 ENCOUNTER — Other Ambulatory Visit: Payer: Self-pay

## 2021-03-28 DIAGNOSIS — Z1231 Encounter for screening mammogram for malignant neoplasm of breast: Secondary | ICD-10-CM

## 2021-03-28 DIAGNOSIS — Z13820 Encounter for screening for osteoporosis: Secondary | ICD-10-CM

## 2021-03-29 ENCOUNTER — Encounter: Payer: Self-pay | Admitting: Internal Medicine

## 2021-04-06 ENCOUNTER — Encounter: Payer: Medicare Other | Admitting: Internal Medicine

## 2021-04-19 ENCOUNTER — Ambulatory Visit (INDEPENDENT_AMBULATORY_CARE_PROVIDER_SITE_OTHER): Payer: Medicare Other | Admitting: Internal Medicine

## 2021-04-19 ENCOUNTER — Encounter: Payer: Self-pay | Admitting: Internal Medicine

## 2021-04-19 ENCOUNTER — Other Ambulatory Visit: Payer: Self-pay

## 2021-04-19 VITALS — BP 116/78 | HR 58 | Temp 98.0°F | Ht 64.0 in | Wt 177.0 lb

## 2021-04-19 DIAGNOSIS — F411 Generalized anxiety disorder: Secondary | ICD-10-CM | POA: Diagnosis not present

## 2021-04-19 DIAGNOSIS — Z0000 Encounter for general adult medical examination without abnormal findings: Secondary | ICD-10-CM | POA: Diagnosis not present

## 2021-04-19 DIAGNOSIS — Z23 Encounter for immunization: Secondary | ICD-10-CM | POA: Diagnosis not present

## 2021-04-19 DIAGNOSIS — E782 Mixed hyperlipidemia: Secondary | ICD-10-CM

## 2021-04-19 DIAGNOSIS — K219 Gastro-esophageal reflux disease without esophagitis: Secondary | ICD-10-CM | POA: Diagnosis not present

## 2021-04-19 DIAGNOSIS — E039 Hypothyroidism, unspecified: Secondary | ICD-10-CM

## 2021-04-19 MED ORDER — SHINGRIX 50 MCG/0.5ML IM SUSR: 0.5000 mL | Freq: Once | INTRAMUSCULAR | 1 refills | Status: AC

## 2021-04-19 NOTE — Progress Notes (Signed)
Date:  04/19/2021   Name:  Martha Walsh   DOB:  [birth year]   MRN:  [id number]   Chief Complaint: Annual Exam (No Breast exam mammo done beginning of July no pap) Martha Walsh is a 70 y.o. female who presents today for her Complete Annual Exam. She feels well. She reports exercising walking 2-3 days a week. She reports she is sleeping well. Breast complaints none.  Mammogram: 03/2021 DEXA: 02/2021 Normal Colonoscopy: 09/2017 repeat 10 yrs  Immunization History  Administered Date(s) Administered   Influenza Inj Mdck Quad Pf 06/03/2019   Influenza, High Dose Seasonal PF 06/03/2019   Influenza, Seasonal, Injecte, Preservative Fre 06/06/2010   Influenza,inj,Quad PF,6+ Mos 09/10/2016, 10/14/2017, 06/15/2018   Influenza-Unspecified 06/03/2019, 06/07/2020   PFIZER(Purple Top)SARS-COV-2 Vaccination 11/05/2019, 11/26/2019, 09/18/2020   Pneumococcal Conjugate-13 11/22/2013   Pneumococcal Polysaccharide-23 01/16/2015   Tdap 06/06/2010, 07/29/2020   Zoster, Live 02/07/2016    Hyperlipidemia This is a chronic problem. The problem is controlled. Pertinent negatives include no chest pain or shortness of breath. Current antihyperlipidemic treatment includes statins.  Thyroid Problem Presents for follow-up visit. Patient reports no anxiety, constipation, diarrhea, fatigue, palpitations or tremors. The symptoms have been stable. Her past medical history is significant for hyperlipidemia.  Gastroesophageal Reflux She complains of heartburn. She reports no abdominal pain, no chest pain, no coughing or no wheezing. This is a recurrent problem. The problem occurs occasionally. Pertinent negatives include no fatigue. She has tried a PPI for the symptoms.  Anxiety Presents for follow-up visit. Patient reports no chest pain, dizziness, nervous/anxious behavior, palpitations or shortness of breath. The quality of sleep is good.   Compliance with medications is 76-100%.   Lab Results  Component Value Date    CREATININE 0.97 12/07/2020   BUN 27 12/07/2020   NA 138 12/07/2020   K 4.6 12/07/2020   CL 102 12/07/2020   CO2 21 12/07/2020   Lab Results  Component Value Date   CHOL 194 04/18/2020   HDL 69 04/18/2020   LDLCALC 106 04/18/2020   TRIG 93 04/18/2020   Lab Results  Component Value Date   TSH 5.160 (H) 12/07/2020   No results found for: HGBA1C No results found for: WBC, HGB, HCT, MCV, PLT Lab Results  Component Value Date   ALT 17 12/07/2020   AST 23 12/07/2020   ALKPHOS 68 12/07/2020   BILITOT 0.4 12/07/2020     Review of Systems  Constitutional:  Negative for chills, fatigue and fever.  HENT:  Negative for congestion, hearing loss, tinnitus, trouble swallowing and voice change.   Eyes:  Negative for visual disturbance.  Respiratory:  Negative for cough, chest tightness, shortness of breath and wheezing.   Cardiovascular:  Negative for chest pain, palpitations and leg swelling.  Gastrointestinal:  Positive for heartburn. Negative for abdominal pain, constipation, diarrhea and vomiting.  Endocrine: Negative for polydipsia and polyuria.  Genitourinary:  Negative for dysuria, frequency, genital sores, vaginal bleeding and vaginal discharge.  Musculoskeletal:  Negative for arthralgias, gait problem and joint swelling.  Skin:  Negative for color change and rash.  Neurological:  Negative for dizziness, tremors, light-headedness and headaches.  Hematological:  Negative for adenopathy. Does not bruise/bleed easily.  Psychiatric/Behavioral:  Negative for dysphoric mood and sleep disturbance. The patient is not nervous/anxious.    Patient Active Problem List   Diagnosis Date Noted   History of nonmelanoma skin cancer 12/04/2020   Primary osteoarthritis of right knee 12/01/2020   Murmur 01/03/2020   Anxiety 06/04/2019  Status post left partial knee replacement 03/12/2016   Acquired hypothyroidism 02/07/2016   Atypical mole 11/22/2015   Gallstone 10/04/2015    Gastro-esophageal reflux disease without esophagitis 08/04/2015   Nocturnal leg cramps 11/22/2013   Obesity (BMI 30-39.9) 11/22/2013   Mixed hyperlipidemia 09/28/2013    Allergies  Allergen Reactions   Amoxicillin-Pot Clavulanate Other (See Comments)    Abdominal Pain    Lisinopril Cough         Past Surgical History:  Procedure Laterality Date   APPENDECTOMY     ESOPHAGOGASTRODUODENOSCOPY (EGD) WITH PROPOFOL N/A 07/27/2020   Procedure: ESOPHAGOGASTRODUODENOSCOPY (EGD) WITH PROPOFOL;  Surgeon: Lucilla Lame, MD;  Location: [facility];  Service: Endoscopy;  Laterality: N/A;   JOINT REPLACEMENT     TONSILLECTOMY      Social History   Tobacco Use   Smoking status: Former    Types: Cigarettes    Quit date: 09/23/1998    Years since quitting: 22.5   Smokeless tobacco: Never  Vaping Use   Vaping Use: Never used  Substance Use Topics   Alcohol use: Yes    Comment: occasionally   Drug use: Never     Medication list has been reviewed and updated.  Current Meds  Medication Sig   aspirin 81 MG EC tablet Take 81 mg by mouth daily.   azelastine (ASTELIN) 0.1 % nasal spray Place 1 spray into both nostrils as needed.   Black Pepper-Turmeric 3-500 MG CAPS Take 1 capsule by mouth daily.   Calcium Carbonate-Vitamin D 600-400 MG-UNIT tablet Take 1 tablet by mouth daily.   Cholecalciferol 25 MCG (1000 UT) tablet Take 1,000 Units by mouth daily.   citalopram (CELEXA) 10 MG tablet Take 1 tablet (10 mg total) by mouth daily.   clonazePAM (KLONOPIN) 0.5 MG tablet Take 0.5 mg by mouth as needed for anxiety.   cyclobenzaprine (FLEXERIL) 5 MG tablet Take 5 mg by mouth as needed.   ezetimibe (ZETIA) 10 MG tablet Take 1 tablet (10 mg total) by mouth daily.   levothyroxine (SYNTHROID) 25 MCG tablet Take 50 mcg by mouth daily before breakfast.   meloxicam (MOBIC) 15 MG tablet Take 1 tablet (15 mg total) by mouth daily.   pantoprazole (PROTONIX) 40 MG tablet Take 1 tablet by mouth once  daily   potassium chloride (KLOR-CON) 10 MEQ tablet Take 10 mEq by mouth daily. Every other day for leg cramps   pravastatin (PRAVACHOL) 80 MG tablet Take 1 tablet (80 mg total) by mouth daily.   Zoster Vaccine Adjuvanted [facility]) injection Inject 0.5 mLs into the muscle once for 1 dose.    PHQ 2/9 Scores 04/19/2021 03/05/2021 02/14/2021 12/01/2020  PHQ - 2 Score 0 0 0 0  PHQ- 9 Score 0 - 0 0    GAD 7 : Generalized Anxiety Score 04/19/2021 02/14/2021 12/01/2020  Nervous, Anxious, on Edge 0 0 0  Control/stop worrying 0 0 0  Worry too much - different things 0 0 0  Trouble relaxing 0 0 0  Restless 0 0 0  Easily annoyed or irritable 0 0 0  Afraid - awful might happen 0 0 0  Total GAD 7 Score 0 0 0  Anxiety Difficulty - Not difficult at all -    BP Readings from Last 3 Encounters:  04/19/21 116/78  02/14/21 140/82  12/01/20 100/66    Physical Exam Vitals and nursing note reviewed.  Constitutional:      General: She is not in acute distress.    Appearance:  She is well-developed.  HENT:     Head: Normocephalic and atraumatic.     Right Ear: Tympanic membrane and ear canal normal.     Left Ear: Tympanic membrane and ear canal normal.     Nose:     Right Sinus: No maxillary sinus tenderness.     Left Sinus: No maxillary sinus tenderness.  Eyes:     General: No scleral icterus.       Right eye: No discharge.        Left eye: No discharge.     Conjunctiva/sclera: Conjunctivae normal.  Neck:     Thyroid: No thyromegaly.     Vascular: No carotid bruit.  Cardiovascular:     Rate and Rhythm: Normal rate and regular rhythm.     Pulses: Normal pulses.     Heart sounds: Normal heart sounds.  Pulmonary:     Effort: Pulmonary effort is normal. No respiratory distress.     Breath sounds: No wheezing.  Abdominal:     General: Bowel sounds are normal.     Palpations: Abdomen is soft.     Tenderness: There is no abdominal tenderness.  Musculoskeletal:     Cervical back: Normal range  of motion. No erythema.     Right lower leg: No edema.     Left lower leg: No edema.  Lymphadenopathy:     Cervical: No cervical adenopathy.  Skin:    General: Skin is warm and dry.     Findings: No rash.  Neurological:     Mental Status: She is alert and oriented to person, place, and time.     Cranial Nerves: No cranial nerve deficit.     Sensory: No sensory deficit.     Deep Tendon Reflexes: Reflexes are normal and symmetric.  Psychiatric:        Attention and Perception: Attention normal.        Mood and Affect: Mood normal.    Wt Readings from Last 3 Encounters:  04/19/21 177 lb (80.3 kg)  02/14/21 180 lb (81.6 kg)  12/01/20 176 lb (79.8 kg)    BP 116/78   Pulse (!) 58   Temp 98 F (36.7 C) (Oral)   Ht '5\' 4"'$  (1.626 m)   Wt 177 lb (80.3 kg)   SpO2 96%   BMI 30.38 kg/m   Assessment and Plan: 1. Annual physical exam Normal exam; continue healthy diet and weight loss efforts Mammogram done recently. Colonoscopy in 2019 was normal - will likely age out of routine screening  2. Acquired hypothyroidism supplemented - TSH + free T4  3. Mixed hyperlipidemia Tolerating statin medication without side effects at this time.  On statin and Zetia. LDL is at goal of < 70 on current dose Continue same therapy without change at this time. - Comprehensive metabolic panel - Lipid panel  4. Gastro-esophageal reflux disease without esophagitis Symptoms well controlled on daily PPI No red flag signs such as weight loss, n/v, melena Will continue daily Protonix prescribed by GI. - CBC with Differential/Platelet  5. Need for shingles vaccine - Zoster Vaccine Adjuvanted [facility]) injection; Inject 0.5 mLs into the muscle once for 1 dose.  Dispense: 0.5 mL; Refill: 1  6. Generalized anxiety disorder Doing well on Celexa. No side effects to medication or recent worsening of symptoms.   Partially dictated using Editor, commissioning. Any errors are unintentional.  Halina Maidens, MD [facility]  04/19/2021

## 2021-04-20 DIAGNOSIS — M9902 Segmental and somatic dysfunction of thoracic region: Secondary | ICD-10-CM | POA: Diagnosis not present

## 2021-04-20 DIAGNOSIS — M9901 Segmental and somatic dysfunction of cervical region: Secondary | ICD-10-CM | POA: Diagnosis not present

## 2021-04-20 DIAGNOSIS — M5033 Other cervical disc degeneration, cervicothoracic region: Secondary | ICD-10-CM | POA: Diagnosis not present

## 2021-04-20 DIAGNOSIS — M62830 Muscle spasm of back: Secondary | ICD-10-CM | POA: Diagnosis not present

## 2021-04-20 LAB — CBC WITH DIFFERENTIAL/PLATELET
Basophils Absolute: 0 10*3/uL (ref 0.0–0.2)
Basos: 1 %
EOS (ABSOLUTE): 0.2 10*3/uL (ref 0.0–0.4)
Eos: 5 %
Hematocrit: 37.5 % (ref 34.0–46.6)
Hemoglobin: 12.6 g/dL (ref 11.1–15.9)
Immature Grans (Abs): 0 10*3/uL (ref 0.0–0.1)
Immature Granulocytes: 0 %
Lymphocytes Absolute: 1.7 10*3/uL (ref 0.7–3.1)
Lymphs: 38 %
MCH: 28.5 pg (ref 26.6–33.0)
MCHC: 33.6 g/dL (ref 31.5–35.7)
MCV: 85 fL (ref 79–97)
Monocytes Absolute: 0.4 10*3/uL (ref 0.1–0.9)
Monocytes: 10 %
Neutrophils Absolute: 2 10*3/uL (ref 1.4–7.0)
Neutrophils: 46 %
Platelets: 205 10*3/uL (ref 150–450)
RBC: 4.42 x10E6/uL (ref 3.77–5.28)
RDW: 13.4 % (ref 11.7–15.4)
WBC: 4.4 10*3/uL (ref 3.4–10.8)

## 2021-04-20 LAB — COMPREHENSIVE METABOLIC PANEL
ALT: 16 IU/L (ref 0–32)
AST: 23 IU/L (ref 0–40)
Albumin/Globulin Ratio: 2.1 (ref 1.2–2.2)
Albumin: 4.5 g/dL (ref 3.7–4.7)
Alkaline Phosphatase: 72 IU/L (ref 44–121)
BUN/Creatinine Ratio: 17 (ref 12–28)
BUN: 18 mg/dL (ref 8–27)
Bilirubin Total: 0.3 mg/dL (ref 0.0–1.2)
CO2: 24 mmol/L (ref 20–29)
Calcium: 9.7 mg/dL (ref 8.7–10.3)
Chloride: 103 mmol/L (ref 96–106)
Creatinine, Ser: 1.08 mg/dL — ABNORMAL HIGH (ref 0.57–1.00)
Globulin, Total: 2.1 g/dL (ref 1.5–4.5)
Glucose: 99 mg/dL (ref 65–99)
Potassium: 4.5 mmol/L (ref 3.5–5.2)
Sodium: 139 mmol/L (ref 134–144)
Total Protein: 6.6 g/dL (ref 6.0–8.5)
eGFR: 55 mL/min/{1.73_m2} — ABNORMAL LOW (ref >59)

## 2021-04-20 LAB — LIPID PANEL
Chol/HDL Ratio: 3.5 ratio (ref 0.0–4.4)
Cholesterol, Total: 224 mg/dL — ABNORMAL HIGH (ref 100–199)
HDL: 64 mg/dL (ref >39)
LDL Chol Calc (NIH): 139 mg/dL — ABNORMAL HIGH (ref 0–99)
Triglycerides: 118 mg/dL (ref 0–149)
VLDL Cholesterol Cal: 21 mg/dL (ref 5–40)

## 2021-04-20 LAB — TSH+FREE T4
Free T4: 1.31 ng/dL (ref 0.82–1.77)
TSH: 2.95 u[IU]/mL (ref 0.450–4.500)

## 2021-05-14 ENCOUNTER — Other Ambulatory Visit: Payer: Self-pay | Admitting: Internal Medicine

## 2021-05-14 MED ORDER — LEVOTHYROXINE SODIUM 50 MCG PO TABS: 50.0000 ug | ORAL_TABLET | Freq: Every day | ORAL | 0 refills | Status: DC

## 2021-05-14 NOTE — Telephone Encounter (Signed)
Requested medication (s) are due for refill today:  Yes  Requested medication (s) are on the active medication list:   Yes  Future visit scheduled:   Yes   Last ordered: 07/11/2020 by a historical provider  Returned because last prescribed by a historical provider.   Requested Prescriptions  Pending Prescriptions Disp Refills   levothyroxine (SYNTHROID) 25 MCG tablet      Sig: Take 2 tablets (50 mcg total) by mouth daily before breakfast.     Endocrinology:  Hypothyroid Agents Failed - 05/14/2021 11:10 AM      Failed - TSH needs to be rechecked within 3 months after an abnormal result. Refill until TSH is due.      Passed - TSH in normal range and within 360 days    TSH  Date Value Ref Range Status  04/19/2021 2.950 0.450 - 4.500 uIU/mL Final          Passed - Valid encounter within last 12 months    Recent Outpatient Visits           3 weeks ago Annual physical exam   [facility] Glean Hess, MD   2 months ago Muscle weakness   [facility] Glean Hess, MD   5 months ago Acquired hypothyroidism   [facility] Glean Hess, MD       Future Appointments             In 66 months Army Melia Jesse Sans, MD [facility], [facility]

## 2021-05-14 NOTE — Telephone Encounter (Signed)
Medication Refill - Medication: levothyroxine (SYNTHROID) 25 MCG tablet  Has the patient contacted their pharmacy? Yes.    (Agent: If yes, when and what did the pharmacy advise?) Contact PCP because this would medication was prescribed by her previous doctor  Preferred Pharmacy (with phone number or street name):  [place], [place] Phone:  [phone]  Fax:  [phone]      Agent: Please be advised that RX refills may take up to 3 business days. We ask that you follow-up with your pharmacy.  Patient last seen 02/14/2021 by PCP

## 2021-05-16 DIAGNOSIS — M9901 Segmental and somatic dysfunction of cervical region: Secondary | ICD-10-CM | POA: Diagnosis not present

## 2021-05-16 DIAGNOSIS — M9902 Segmental and somatic dysfunction of thoracic region: Secondary | ICD-10-CM | POA: Diagnosis not present

## 2021-05-16 DIAGNOSIS — M5033 Other cervical disc degeneration, cervicothoracic region: Secondary | ICD-10-CM | POA: Diagnosis not present

## 2021-05-16 DIAGNOSIS — M62830 Muscle spasm of back: Secondary | ICD-10-CM | POA: Diagnosis not present

## 2021-05-22 ENCOUNTER — Other Ambulatory Visit: Payer: Self-pay | Admitting: Internal Medicine

## 2021-05-22 DIAGNOSIS — E782 Mixed hyperlipidemia: Secondary | ICD-10-CM

## 2021-05-22 NOTE — Telephone Encounter (Signed)
Requested Prescriptions  Pending Prescriptions Disp Refills  . ezetimibe (ZETIA) 10 MG tablet [Pharmacy Med Name: Ezetimibe 10 MG Oral Tablet] 90 tablet 0    Sig: Take 1 tablet by mouth once daily     Cardiovascular:  Antilipid - Sterol Transport Inhibitors Failed - 05/22/2021  3:33 PM      Failed - Total Cholesterol in normal range and within 360 days    Cholesterol, Total  Date Value Ref Range Status  04/19/2021 224 (H) 100 - 199 mg/dL Final         Failed - LDL in normal range and within 360 days    LDL Chol Calc (NIH)  Date Value Ref Range Status  04/19/2021 139 (H) 0 - 99 mg/dL Final         Passed - HDL in normal range and within 360 days    HDL  Date Value Ref Range Status  04/19/2021 64 >39 mg/dL Final         Passed - Triglycerides in normal range and within 360 days    Triglycerides  Date Value Ref Range Status  04/19/2021 118 0 - 149 mg/dL Final         Passed - Valid encounter within last 12 months    Recent Outpatient Visits          1 month ago Annual physical exam   [facility] Glean Hess, MD   3 months ago Muscle weakness   [facility] Glean Hess, MD   5 months ago Acquired hypothyroidism   [facility] Glean Hess, MD      Future Appointments            In 77 months Army Melia Jesse Sans, MD [facility], [facility]

## 2021-06-04 ENCOUNTER — Other Ambulatory Visit: Payer: Self-pay | Admitting: Internal Medicine

## 2021-06-04 DIAGNOSIS — F419 Anxiety disorder, unspecified: Secondary | ICD-10-CM

## 2021-06-04 DIAGNOSIS — E782 Mixed hyperlipidemia: Secondary | ICD-10-CM

## 2021-06-13 DIAGNOSIS — M62830 Muscle spasm of back: Secondary | ICD-10-CM | POA: Diagnosis not present

## 2021-06-13 DIAGNOSIS — M9902 Segmental and somatic dysfunction of thoracic region: Secondary | ICD-10-CM | POA: Diagnosis not present

## 2021-06-13 DIAGNOSIS — M5033 Other cervical disc degeneration, cervicothoracic region: Secondary | ICD-10-CM | POA: Diagnosis not present

## 2021-06-13 DIAGNOSIS — M9901 Segmental and somatic dysfunction of cervical region: Secondary | ICD-10-CM | POA: Diagnosis not present

## 2021-07-11 DIAGNOSIS — M5033 Other cervical disc degeneration, cervicothoracic region: Secondary | ICD-10-CM | POA: Diagnosis not present

## 2021-07-11 DIAGNOSIS — M62830 Muscle spasm of back: Secondary | ICD-10-CM | POA: Diagnosis not present

## 2021-07-11 DIAGNOSIS — M9901 Segmental and somatic dysfunction of cervical region: Secondary | ICD-10-CM | POA: Diagnosis not present

## 2021-07-11 DIAGNOSIS — M9902 Segmental and somatic dysfunction of thoracic region: Secondary | ICD-10-CM | POA: Diagnosis not present

## 2021-08-06 ENCOUNTER — Other Ambulatory Visit: Payer: Self-pay | Admitting: Internal Medicine

## 2021-08-06 NOTE — Telephone Encounter (Signed)
Requested Prescriptions  Pending Prescriptions Disp Refills  . levothyroxine (SYNTHROID) 50 MCG tablet [Pharmacy Med Name: Levothyroxine Sodium 50 MCG Oral Tablet] 90 tablet 3    Sig: TAKE 1 TABLET BY MOUTH ONCE DAILY BEFORE  BREAKFAST     Endocrinology:  Hypothyroid Agents Failed - 08/06/2021  9:14 AM      Failed - TSH needs to be rechecked within 3 months after an abnormal result. Refill until TSH is due.      Passed - TSH in normal range and within 360 days    TSH  Date Value Ref Range Status  04/19/2021 2.950 0.450 - 4.500 uIU/mL Final         Passed - Valid encounter within last 12 months    Recent Outpatient Visits          3 months ago Annual physical exam   [facility] Glean Hess, MD   5 months ago Muscle weakness   [facility] Glean Hess, MD   8 months ago Acquired hypothyroidism   [facility] Glean Hess, MD      Future Appointments            In 9 months Army Melia Jesse Sans, MD [facility], [facility]

## 2021-08-08 DIAGNOSIS — M9902 Segmental and somatic dysfunction of thoracic region: Secondary | ICD-10-CM | POA: Diagnosis not present

## 2021-08-08 DIAGNOSIS — M9901 Segmental and somatic dysfunction of cervical region: Secondary | ICD-10-CM | POA: Diagnosis not present

## 2021-08-08 DIAGNOSIS — M5033 Other cervical disc degeneration, cervicothoracic region: Secondary | ICD-10-CM | POA: Diagnosis not present

## 2021-08-08 DIAGNOSIS — M62830 Muscle spasm of back: Secondary | ICD-10-CM | POA: Diagnosis not present

## 2021-08-09 ENCOUNTER — Telehealth: Payer: Self-pay | Admitting: Internal Medicine

## 2021-08-09 NOTE — Telephone Encounter (Signed)
Tried [facility] to give verbal to change manufacturers. Phone continued to ring. Will try again.

## 2021-08-09 NOTE — Telephone Encounter (Signed)
Mickel Baas at [facility] called saying they need to change manufactures on patients Levothyroxine .  They need to notify the provider when they do this.  CB#  [phone]

## 2021-08-27 DIAGNOSIS — Z20822 Contact with and (suspected) exposure to covid-19: Secondary | ICD-10-CM | POA: Diagnosis not present

## 2021-08-28 ENCOUNTER — Ambulatory Visit: Payer: Self-pay | Admitting: *Deleted

## 2021-08-28 ENCOUNTER — Other Ambulatory Visit: Payer: Self-pay | Admitting: Internal Medicine

## 2021-08-28 DIAGNOSIS — E782 Mixed hyperlipidemia: Secondary | ICD-10-CM

## 2021-08-28 NOTE — Telephone Encounter (Signed)
I returned pt's call.  She had called in earlier today that she tested positive for Covid yesterday and feels like she has a head cold.  Pt is co head congestion and a non productive cough.   She went to the drug store and was tested for Covid and they just texted her that her test was positive.   I went over the quarantine protocol with her, OTC medications she could use and answered her questions.  She does not have fever but feels feverish.  No diarrhea or vomiting/nausea.   See triage notes.  I went over the s/s to call us back for or go to the ED:  shortness of breath, chest tightness, fever 103, symptoms getting worse or she is not getting better to call us back.   She verbalized understanding.     I let her know about the antiviral available but since she doesn't have any high risk factors other than her age she has decided not to take an antiviral.   "I think I'll just let the virus run its course".     I did let her know I would pass this information on to Dr. Army Melia so she would be aware pt is positive for Covid.   Pt thanked me for my help.  Notes sent to [facility].

## 2021-08-28 NOTE — Telephone Encounter (Signed)
Reason for Disposition  [1] COVID-19 diagnosed by positive lab test (e.g., PCR, rapid self-test kit) AND [2] mild symptoms (e.g., cough, fever, others) AND [5] no complications or SOB  Answer Assessment - Initial Assessment Questions 1. COVID-19 DIAGNOSIS: "Who made your COVID-19 diagnosis?" "Was it confirmed by a positive lab test or self-test?" If not diagnosed by a doctor (or NP/PA), ask "Are there lots of cases (community spread) where you live?" Note: See public health department website, if unsure.     I returned her call.   She has tested positive for COvid. 2. COVID-19 EXPOSURE: "Was there any known exposure to COVID before the symptoms began?" CDC Definition of close contact: within 6 feet (2 meters) for a total of 15 minutes or more over a 24-hour period.      *No Answer* 3. ONSET: "When did the COVID-19 symptoms start?"      I have a head cold, chills, feel feverish but no fever, coughing, No ear ache.   Stopped up nose.   Sore throat, voice hoarse.  NO diarrhea or vomiting 4. WORST SYMPTOM: "What is your worst symptom?" (e.g., cough, fever, shortness of breath, muscle aches)     Coughing sore throat.   The body aches come and go 5. COUGH: "Do you have a cough?" If Yes, ask: "How bad is the cough?"       Coughing a dry cough 6. FEVER: "Do you have a fever?" If Yes, ask: "What is your temperature, how was it measured, and when did it start?"     Feel feverish but no fever.   7. RESPIRATORY STATUS: "Describe your breathing?" (e.g., shortness of breath, wheezing, unable to speak)      No 8. BETTER-SAME-WORSE: "Are you getting better, staying the same or getting worse compared to yesterday?"  If getting worse, ask, "In what way?"     Sunday I started having symptoms.     9. HIGH RISK DISEASE: "Do you have any chronic medical problems?" (e.g., asthma, heart or lung disease, weak immune system, obesity, etc.)     No  just her age 70. VACCINE: "Have you had the COVID-19 vaccine?" If Yes,  ask: "Which one, how many shots, when did you get it?"       I had the first 2 onlly 11. BOOSTER: "Have you received your COVID-19 booster?" If Yes, ask: "Which one and when did you get it?"       No 12. PREGNANCY: "Is there any chance you are pregnant?" "When was your last menstrual period?"       N/A 13. OTHER SYMPTOMS: "Do you have any other symptoms?"  (e.g., chills, fatigue, headache, loss of smell or taste, muscle pain, sore throat)       Body aches, tired, no headache.   Not much appetite 14. O2 SATURATION MONITOR:  "Do you use an oxygen saturation monitor (pulse oximeter) at home?" If Yes, ask "What is your reading (oxygen level) today?" "What is your usual oxygen saturation reading?" (e.g., 95%)       No  Protocols used: Coronavirus (COVID-19) Diagnosed or Suspected-A-AH

## 2021-08-29 ENCOUNTER — Ambulatory Visit: Payer: Medicare Other

## 2021-08-30 DIAGNOSIS — Z20822 Contact with and (suspected) exposure to covid-19: Secondary | ICD-10-CM | POA: Diagnosis not present

## 2021-08-31 DIAGNOSIS — Z20822 Contact with and (suspected) exposure to covid-19: Secondary | ICD-10-CM | POA: Diagnosis not present

## 2021-09-01 DIAGNOSIS — Z20822 Contact with and (suspected) exposure to covid-19: Secondary | ICD-10-CM | POA: Diagnosis not present

## 2021-09-10 ENCOUNTER — Other Ambulatory Visit: Payer: Self-pay | Admitting: Internal Medicine

## 2021-09-10 DIAGNOSIS — M5033 Other cervical disc degeneration, cervicothoracic region: Secondary | ICD-10-CM | POA: Diagnosis not present

## 2021-09-10 DIAGNOSIS — M62830 Muscle spasm of back: Secondary | ICD-10-CM | POA: Diagnosis not present

## 2021-09-10 DIAGNOSIS — E782 Mixed hyperlipidemia: Secondary | ICD-10-CM

## 2021-09-10 DIAGNOSIS — F419 Anxiety disorder, unspecified: Secondary | ICD-10-CM

## 2021-09-10 DIAGNOSIS — M9902 Segmental and somatic dysfunction of thoracic region: Secondary | ICD-10-CM | POA: Diagnosis not present

## 2021-09-10 DIAGNOSIS — M9901 Segmental and somatic dysfunction of cervical region: Secondary | ICD-10-CM | POA: Diagnosis not present

## 2021-09-10 NOTE — Telephone Encounter (Signed)
Requested Prescriptions  Pending Prescriptions Disp Refills   citalopram (CELEXA) 10 MG tablet [Pharmacy Med Name: Citalopram Hydrobromide 10 MG Oral Tablet] 90 tablet 2    Sig: Take 1 tablet by mouth once daily     Psychiatry:  Antidepressants - SSRI Passed - 09/10/2021  9:21 AM      Passed - Valid encounter within last 6 months    Recent Outpatient Visits          4 months ago Annual physical exam   [facility] Glean Hess, MD   6 months ago Muscle weakness   [facility] Glean Hess, MD   9 months ago Acquired hypothyroidism   [place], [place]      Future Appointments            In 7 months Army Melia Jesse Sans, MD [facility], [facility]            pravastatin (PRAVACHOL) 80 MG tablet [Pharmacy Med Name: Pravastatin Sodium 80 MG Oral Tablet] 90 tablet 2    Sig: Take 1 tablet by mouth once daily     Cardiovascular:  Antilipid - Statins Failed - 09/10/2021  9:21 AM      Failed - Total Cholesterol in normal range and within 360 days    Cholesterol, Total  Date Value Ref Range Status  04/19/2021 224 (H) 100 - 199 mg/dL Final         Failed - LDL in normal range and within 360 days    LDL Chol Calc (NIH)  Date Value Ref Range Status  04/19/2021 139 (H) 0 - 99 mg/dL Final         Passed - HDL in normal range and within 360 days    HDL  Date Value Ref Range Status  04/19/2021 64 >39 mg/dL Final         Passed - Triglycerides in normal range and within 360 days    Triglycerides  Date Value Ref Range Status  04/19/2021 118 0 - 149 mg/dL Final         Passed - Patient is not pregnant      Passed - Valid encounter within last 12 months    Recent Outpatient Visits          4 months ago Annual physical exam   [facility] Glean Hess, MD   6 months ago Muscle weakness   [facility] Glean Hess, MD   9 months ago Acquired hypothyroidism   [facility] Glean Hess, MD      Future Appointments            In 7 months Army Melia Jesse Sans, MD [facility], [facility]

## 2021-09-14 DIAGNOSIS — H2513 Age-related nuclear cataract, bilateral: Secondary | ICD-10-CM | POA: Diagnosis not present

## 2021-09-21 DIAGNOSIS — M9902 Segmental and somatic dysfunction of thoracic region: Secondary | ICD-10-CM | POA: Diagnosis not present

## 2021-09-21 DIAGNOSIS — M9901 Segmental and somatic dysfunction of cervical region: Secondary | ICD-10-CM | POA: Diagnosis not present

## 2021-09-21 DIAGNOSIS — M5033 Other cervical disc degeneration, cervicothoracic region: Secondary | ICD-10-CM | POA: Diagnosis not present

## 2021-09-21 DIAGNOSIS — M62830 Muscle spasm of back: Secondary | ICD-10-CM | POA: Diagnosis not present

## 2021-09-24 DIAGNOSIS — Z20822 Contact with and (suspected) exposure to covid-19: Secondary | ICD-10-CM | POA: Diagnosis not present

## 2021-09-25 DIAGNOSIS — Z20822 Contact with and (suspected) exposure to covid-19: Secondary | ICD-10-CM | POA: Diagnosis not present

## 2021-09-26 DIAGNOSIS — Z20822 Contact with and (suspected) exposure to covid-19: Secondary | ICD-10-CM | POA: Diagnosis not present

## 2021-10-01 DIAGNOSIS — M9901 Segmental and somatic dysfunction of cervical region: Secondary | ICD-10-CM | POA: Diagnosis not present

## 2021-10-01 DIAGNOSIS — M62830 Muscle spasm of back: Secondary | ICD-10-CM | POA: Diagnosis not present

## 2021-10-01 DIAGNOSIS — M9902 Segmental and somatic dysfunction of thoracic region: Secondary | ICD-10-CM | POA: Diagnosis not present

## 2021-10-01 DIAGNOSIS — M5033 Other cervical disc degeneration, cervicothoracic region: Secondary | ICD-10-CM | POA: Diagnosis not present

## 2021-10-03 DIAGNOSIS — M9902 Segmental and somatic dysfunction of thoracic region: Secondary | ICD-10-CM | POA: Diagnosis not present

## 2021-10-03 DIAGNOSIS — M5033 Other cervical disc degeneration, cervicothoracic region: Secondary | ICD-10-CM | POA: Diagnosis not present

## 2021-10-03 DIAGNOSIS — M9901 Segmental and somatic dysfunction of cervical region: Secondary | ICD-10-CM | POA: Diagnosis not present

## 2021-10-03 DIAGNOSIS — M62830 Muscle spasm of back: Secondary | ICD-10-CM | POA: Diagnosis not present

## 2021-10-05 DIAGNOSIS — M545 Low back pain, unspecified: Secondary | ICD-10-CM | POA: Insufficient documentation

## 2021-10-08 ENCOUNTER — Other Ambulatory Visit: Payer: Self-pay | Admitting: Gastroenterology

## 2021-10-08 ENCOUNTER — Other Ambulatory Visit: Payer: Self-pay | Admitting: Internal Medicine

## 2021-10-08 DIAGNOSIS — M1711 Unilateral primary osteoarthritis, right knee: Secondary | ICD-10-CM

## 2021-10-08 DIAGNOSIS — M62830 Muscle spasm of back: Secondary | ICD-10-CM | POA: Diagnosis not present

## 2021-10-08 DIAGNOSIS — M9902 Segmental and somatic dysfunction of thoracic region: Secondary | ICD-10-CM | POA: Diagnosis not present

## 2021-10-08 DIAGNOSIS — M9901 Segmental and somatic dysfunction of cervical region: Secondary | ICD-10-CM | POA: Diagnosis not present

## 2021-10-08 DIAGNOSIS — M5033 Other cervical disc degeneration, cervicothoracic region: Secondary | ICD-10-CM | POA: Diagnosis not present

## 2021-10-08 NOTE — Telephone Encounter (Signed)
LOV 04/19/21. Future OV 05/06/22  Approved per protocol.  Patient educated on minimal use of this medication on OV note from 04/19/21. Requested Prescriptions  Pending Prescriptions Disp Refills   meloxicam (MOBIC) 15 MG tablet [Pharmacy Med Name: Meloxicam 15 MG Oral Tablet] 90 tablet 0    Sig: Take 1 tablet by mouth once daily     Analgesics:  COX2 Inhibitors Failed - 10/08/2021  9:08 AM      Failed - Cr in normal range and within 360 days    Creatinine, Ser  Date Value Ref Range Status  04/19/2021 1.08 (H) 0.57 - 1.00 mg/dL Final         Passed - HGB in normal range and within 360 days    Hemoglobin  Date Value Ref Range Status  04/19/2021 12.6 11.1 - 15.9 g/dL Final         Passed - Patient is not pregnant      Passed - Valid encounter within last 12 months    Recent Outpatient Visits          5 months ago Annual physical exam   [facility] Glean Hess, MD   7 months ago Muscle weakness   [facility] Glean Hess, MD   10 months ago Acquired hypothyroidism   [facility] Glean Hess, MD      Future Appointments            In 7 months Army Melia Jesse Sans, MD [facility], [facility]
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
## 2021-10-22 DIAGNOSIS — M545 Low back pain, unspecified: Secondary | ICD-10-CM | POA: Diagnosis not present

## 2021-10-30 ENCOUNTER — Encounter: Payer: Self-pay | Admitting: Internal Medicine

## 2021-11-02 ENCOUNTER — Encounter: Payer: Self-pay | Admitting: Podiatry

## 2021-11-02 ENCOUNTER — Ambulatory Visit (INDEPENDENT_AMBULATORY_CARE_PROVIDER_SITE_OTHER): Payer: Medicare Other

## 2021-11-02 ENCOUNTER — Other Ambulatory Visit: Payer: Self-pay

## 2021-11-02 ENCOUNTER — Ambulatory Visit: Payer: Medicare Other | Admitting: Podiatry

## 2021-11-02 DIAGNOSIS — M722 Plantar fascial fibromatosis: Secondary | ICD-10-CM

## 2021-11-02 MED ORDER — BETAMETHASONE SOD PHOS & ACET 6 (3-3) MG/ML IJ SUSP
3.0000 mg | Freq: Once | INTRAMUSCULAR | Status: AC
  Administered 2021-11-02: 3 mg via INTRA_ARTICULAR

## 2021-11-02 MED ORDER — METHYLPREDNISOLONE 4 MG PO TBPK: ORAL_TABLET | ORAL | 0 refills | Status: DC

## 2021-11-02 NOTE — Progress Notes (Signed)
° °  Subjective: 71 y.o. female presenting as a new patient for evaluation of right heel pain this been going on for about a month.  She denies any change in activity or shoe gear.  She says the pain was a sudden onset.  She denies trauma to the area.  Currently she has not done anything for treatment other than OTC arch supports which she always wears   Past Medical History:  Diagnosis Date   GERD (gastroesophageal reflux disease)    Hyperlipidemia    Hypothyroid    Past Surgical History:  Procedure Laterality Date   APPENDECTOMY     ESOPHAGOGASTRODUODENOSCOPY (EGD) WITH PROPOFOL N/A 07/27/2020   Procedure: ESOPHAGOGASTRODUODENOSCOPY (EGD) WITH PROPOFOL;  Surgeon: Lucilla Lame, MD;  Location: [facility];  Service: Endoscopy;  Laterality: N/A;   JOINT REPLACEMENT     TONSILLECTOMY     Allergies  Allergen Reactions   Amoxicillin-Pot Clavulanate Other (See Comments)    Abdominal Pain    Lisinopril Cough          Objective: Physical Exam General: The patient is alert and oriented x3 in no acute distress.  Dermatology: Skin is warm, dry and supple bilateral lower extremities. Negative for open lesions or macerations bilateral.   Vascular: Dorsalis Pedis and Posterior Tibial pulses palpable bilateral.  Capillary fill time is immediate to all digits.  Neurological: Epicritic and protective threshold intact bilateral.   Musculoskeletal: Tenderness to palpation to the plantar aspect of the right heel along the plantar fascia. All other joints range of motion within normal limits bilateral. Strength 5/5 in all groups bilateral.   Radiographic exam: Normal osseous mineralization. Joint spaces preserved. No fracture/dislocation/boney destruction. No other soft tissue abnormalities or radiopaque foreign bodies.   Assessment: 1. Plantar fasciitis right  Plan of Care:  1. Patient evaluated. Xrays reviewed.   2. Injection of 0.5cc Celestone soluspan injected into the right  plantar fascia  3. Rx for Medrol Dose Pack placed 4.  Patient already has a prescription for meloxicam.  Continue after completion of the Dosepak 5. Plantar fascial band(s) dispensed 6. Instructed patient regarding therapies and modalities at home to alleviate symptoms.  7. Return to clinic in 4 weeks.     Edrick Kins, DPM [facility]  Dr. Edrick Kins, DPM    [address], [place] [address]                Office [phone]  Fax [phone]

## 2021-11-05 ENCOUNTER — Telehealth: Payer: Self-pay | Admitting: Gastroenterology

## 2021-11-05 MED ORDER — PANTOPRAZOLE SODIUM 40 MG PO TBEC: 40.0000 mg | DELAYED_RELEASE_TABLET | Freq: Every day | ORAL | 0 refills | Status: DC

## 2021-11-05 NOTE — Addendum Note (Signed)
Addended by: Lurlean Nanny on: 11/05/2021 10:20 AM   Modules accepted: Orders

## 2021-11-05 NOTE — Telephone Encounter (Signed)
Patient called to request a refill pantoprazole (PROTONIX) 40 MG  appt has been sched.

## 2021-11-06 DIAGNOSIS — M545 Low back pain, unspecified: Secondary | ICD-10-CM | POA: Diagnosis not present

## 2021-11-07 DIAGNOSIS — M5033 Other cervical disc degeneration, cervicothoracic region: Secondary | ICD-10-CM | POA: Diagnosis not present

## 2021-11-07 DIAGNOSIS — M9901 Segmental and somatic dysfunction of cervical region: Secondary | ICD-10-CM | POA: Diagnosis not present

## 2021-11-07 DIAGNOSIS — M62830 Muscle spasm of back: Secondary | ICD-10-CM | POA: Diagnosis not present

## 2021-11-07 DIAGNOSIS — M9902 Segmental and somatic dysfunction of thoracic region: Secondary | ICD-10-CM | POA: Diagnosis not present

## 2021-11-13 DIAGNOSIS — M1711 Unilateral primary osteoarthritis, right knee: Secondary | ICD-10-CM | POA: Diagnosis not present

## 2021-11-30 ENCOUNTER — Other Ambulatory Visit: Payer: Self-pay

## 2021-11-30 ENCOUNTER — Encounter: Payer: Self-pay | Admitting: Podiatry

## 2021-11-30 ENCOUNTER — Ambulatory Visit: Payer: Medicare Other | Admitting: Podiatry

## 2021-11-30 DIAGNOSIS — M722 Plantar fascial fibromatosis: Secondary | ICD-10-CM | POA: Diagnosis not present

## 2021-11-30 MED ORDER — BETAMETHASONE SOD PHOS & ACET 6 (3-3) MG/ML IJ SUSP
3.0000 mg | Freq: Once | INTRAMUSCULAR | Status: AC
  Administered 2021-11-30: 3 mg via INTRA_ARTICULAR

## 2021-11-30 NOTE — Progress Notes (Signed)
? ?  Subjective: ?71 y.o. female presenting today for follow-up evaluation of plantar fasciitis to the right foot.  Patient states that she is doing much better however she does have some residual pain and tenderness.  She has been wearing a copper fit plantar fasciitis arch support that she says has helped.  Slowly over the past week she has had return recurrence of the pain.  She continues to take meloxicam daily. ? ? ?Past Medical History:  ?Diagnosis Date  ? GERD (gastroesophageal reflux disease)   ? Hyperlipidemia   ? Hypothyroid   ? ?Past Surgical History:  ?Procedure Laterality Date  ? APPENDECTOMY    ? ESOPHAGOGASTRODUODENOSCOPY (EGD) WITH PROPOFOL N/A 07/27/2020  ? Procedure: ESOPHAGOGASTRODUODENOSCOPY (EGD) WITH PROPOFOL;  Surgeon: Lucilla Lame, MD;  Location: [facility];  Service: Endoscopy;  Laterality: N/A;  ? JOINT REPLACEMENT    ? TONSILLECTOMY    ? ?Allergies  ?Allergen Reactions  ? Amoxicillin-Pot Clavulanate Other (See Comments)  ?  Abdominal Pain ?  ? Lisinopril Cough  ?   ?  ? ? ? ?Objective: ?Physical Exam ?General: The patient is alert and oriented x3 in no acute distress. ? ?Dermatology: Skin is warm, dry and supple bilateral lower extremities. Negative for open lesions or macerations bilateral.  ? ?Vascular: Dorsalis Pedis and Posterior Tibial pulses palpable bilateral.  Capillary fill time is immediate to all digits. ? ?Neurological: Epicritic and protective threshold intact bilateral.  ? ?Musculoskeletal: There continues to be some tenderness to palpation to the plantar aspect of the right heel along the plantar fascia. All other joints range of motion within normal limits bilateral. Strength 5/5 in all groups bilateral.  ? ?Assessment: ?1. Plantar fasciitis right ? ?Plan of Care:  ?1. Patient evaluated. ?2. Injection of 0.5cc Celestone soluspan injected into the right plantar fascia  ?3.  Continue meloxicam 15 mg daily as prescribed by prescribing physician ?4.  Continue plantar  fascial brace, copper fit, that the patient has been wearing.  It appears to support the arch nicely ?5.  Return to clinic 4 weeks ? ?Edrick Kins, DPM ?[facility] ? ?Dr. Edrick Kins, DPM  ?  ?[address]                                        ?[place],  [address]                ?Office [phone]  ?Fax [phone] ? ? ? ? ?

## 2021-12-05 DIAGNOSIS — M9901 Segmental and somatic dysfunction of cervical region: Secondary | ICD-10-CM | POA: Diagnosis not present

## 2021-12-05 DIAGNOSIS — M5033 Other cervical disc degeneration, cervicothoracic region: Secondary | ICD-10-CM | POA: Diagnosis not present

## 2021-12-05 DIAGNOSIS — M62830 Muscle spasm of back: Secondary | ICD-10-CM | POA: Diagnosis not present

## 2021-12-05 DIAGNOSIS — M9902 Segmental and somatic dysfunction of thoracic region: Secondary | ICD-10-CM | POA: Diagnosis not present

## 2021-12-17 DIAGNOSIS — D229 Melanocytic nevi, unspecified: Secondary | ICD-10-CM | POA: Diagnosis not present

## 2021-12-17 DIAGNOSIS — D1801 Hemangioma of skin and subcutaneous tissue: Secondary | ICD-10-CM | POA: Diagnosis not present

## 2021-12-17 DIAGNOSIS — Z85828 Personal history of other malignant neoplasm of skin: Secondary | ICD-10-CM | POA: Diagnosis not present

## 2021-12-17 DIAGNOSIS — L821 Other seborrheic keratosis: Secondary | ICD-10-CM | POA: Diagnosis not present

## 2021-12-20 ENCOUNTER — Ambulatory Visit: Payer: Medicare Other | Admitting: Gastroenterology

## 2021-12-24 ENCOUNTER — Ambulatory Visit (INDEPENDENT_AMBULATORY_CARE_PROVIDER_SITE_OTHER): Payer: Medicare Other | Admitting: Gastroenterology

## 2021-12-24 ENCOUNTER — Ambulatory Visit (INDEPENDENT_AMBULATORY_CARE_PROVIDER_SITE_OTHER): Payer: Medicare Other | Admitting: Internal Medicine

## 2021-12-24 ENCOUNTER — Encounter: Payer: Self-pay | Admitting: Gastroenterology

## 2021-12-24 ENCOUNTER — Encounter: Payer: Self-pay | Admitting: Internal Medicine

## 2021-12-24 VITALS — BP 118/66 | HR 71 | Ht 64.0 in | Wt 184.0 lb

## 2021-12-24 VITALS — BP 115/72 | HR 71 | Temp 97.9°F | Wt 184.0 lb

## 2021-12-24 DIAGNOSIS — K219 Gastro-esophageal reflux disease without esophagitis: Secondary | ICD-10-CM | POA: Diagnosis not present

## 2021-12-24 DIAGNOSIS — R399 Unspecified symptoms and signs involving the genitourinary system: Secondary | ICD-10-CM | POA: Diagnosis not present

## 2021-12-24 DIAGNOSIS — R194 Change in bowel habit: Secondary | ICD-10-CM

## 2021-12-24 LAB — POC URINALYSIS WITH MICROSCOPIC (NON AUTO)MANUAL RESULT
Bilirubin, UA: NEGATIVE
Crystals: 0
Glucose, UA: NEGATIVE
Ketones, UA: NEGATIVE
Mucus, UA: 0
Nitrite, UA: NEGATIVE
Protein, UA: NEGATIVE
RBC: 10 M/uL — AB (ref 4.04–5.48)
Spec Grav, UA: 1.02 (ref 1.010–1.025)
Urobilinogen, UA: 0.2 E.U./dL
pH, UA: 5 (ref 5.0–8.0)

## 2021-12-24 MED ORDER — SULFAMETHOXAZOLE-TRIMETHOPRIM 800-160 MG PO TABS: 1.0000 | ORAL_TABLET | Freq: Two times a day (BID) | ORAL | 0 refills | Status: AC

## 2021-12-24 MED ORDER — PANTOPRAZOLE SODIUM 40 MG PO TBEC
40.0000 mg | DELAYED_RELEASE_TABLET | Freq: Every day | ORAL | 3 refills | Status: DC
Start: 2021-12-24 — End: 2023-01-17

## 2021-12-24 NOTE — Progress Notes (Signed)
? ? ?Date:  12/24/2021  ? ?Name:  Martha Walsh   DOB:  1951-07-11   MRN:  [id number] ? ? ?Chief Complaint: Urinary Tract Infection ? ?Urinary Tract Infection  ?This is a new problem. The current episode started yesterday. The quality of the pain is described as burning. The pain is moderate. There has been no fever. Associated symptoms include frequency and urgency. Pertinent negatives include no chills, discharge, hematuria, nausea or vomiting. She has tried nothing for the symptoms.  ? ?Lab Results  ?Component Value Date  ? NA 139 04/19/2021  ? K 4.5 04/19/2021  ? CO2 24 04/19/2021  ? GLUCOSE 99 04/19/2021  ? BUN 18 04/19/2021  ? CREATININE 1.08 (H) 04/19/2021  ? CALCIUM 9.7 04/19/2021  ? EGFR 55 (L) 04/19/2021  ? GFRNONAA 56 04/18/2020  ? ?Lab Results  ?Component Value Date  ? CHOL 224 (H) 04/19/2021  ? HDL 64 04/19/2021  ? LDLCALC 139 (H) 04/19/2021  ? TRIG 118 04/19/2021  ? CHOLHDL 3.5 04/19/2021  ? ?Lab Results  ?Component Value Date  ? TSH 2.950 04/19/2021  ? ?No results found for: HGBA1C ?Lab Results  ?Component Value Date  ? WBC 4.4 04/19/2021  ? HGB 12.6 04/19/2021  ? HCT 37.5 04/19/2021  ? MCV 85 04/19/2021  ? PLT 205 04/19/2021  ? ?Lab Results  ?Component Value Date  ? ALT 16 04/19/2021  ? AST 23 04/19/2021  ? ALKPHOS 72 04/19/2021  ? BILITOT 0.3 04/19/2021  ? ?No results found for: 25OHVITD2, [place], VD25OH  ? ?Review of Systems  ?Constitutional:  Negative for chills, fatigue and fever.  ?Respiratory:  Negative for chest tightness and shortness of breath.   ?Cardiovascular:  Negative for chest pain.  ?Gastrointestinal:  Negative for abdominal distention, abdominal pain, nausea and vomiting.  ?Genitourinary:  Positive for frequency and urgency. Negative for hematuria.  ?Psychiatric/Behavioral:  Positive for sleep disturbance (has to get up more often to urinate now). Negative for dysphoric mood. The patient is not nervous/anxious.   ? ?Patient Active Problem List  ? Diagnosis Date Noted  ? Low back pain  10/05/2021  ? History of nonmelanoma skin cancer 12/04/2020  ? Primary osteoarthritis of right knee 12/01/2020  ? Murmur 01/03/2020  ? Generalized anxiety disorder 06/04/2019  ? Status post left partial knee replacement 03/12/2016  ? Acquired hypothyroidism 02/07/2016  ? Atypical mole 11/22/2015  ? Gallstone 10/04/2015  ? Gastro-esophageal reflux disease without esophagitis 08/04/2015  ? Nocturnal leg cramps 11/22/2013  ? Obesity (BMI 30-39.9) 11/22/2013  ? Mixed hyperlipidemia 09/28/2013  ? ? ?Allergies  ?Allergen Reactions  ? Amoxicillin-Pot Clavulanate Other (See Comments)  ?  Abdominal Pain ?  ? Lisinopril Cough  ?   ?  ? ? ?Past Surgical History:  ?Procedure Laterality Date  ? APPENDECTOMY    ? ESOPHAGOGASTRODUODENOSCOPY (EGD) WITH PROPOFOL N/A 07/27/2020  ? Procedure: ESOPHAGOGASTRODUODENOSCOPY (EGD) WITH PROPOFOL;  Surgeon: Lucilla Lame, MD;  Location: [facility];  Service: Endoscopy;  Laterality: N/A;  ? JOINT REPLACEMENT    ? TONSILLECTOMY    ? ? ?Social History  ? ?Tobacco Use  ? Smoking status: Former  ?  Types: Cigarettes  ?  Quit date: 09/23/1998  ?  Years since quitting: 23.2  ? Smokeless tobacco: Never  ?Vaping Use  ? Vaping Use: Never used  ?Substance Use Topics  ? Alcohol use: Yes  ?  Comment: occasionally  ? Drug use: Never  ? ? ? ?Medication list has been reviewed and updated. ? ?Current  Meds  ?Medication Sig  ? aspirin 81 MG EC tablet Take 81 mg by mouth daily.  ? ASPIRIN CHILDRENS 81 MG chewable tablet   ? Black Pepper-Turmeric 3-500 MG CAPS Take 1 capsule by mouth daily.  ? Calcium Carbonate-Vitamin D 600-400 MG-UNIT tablet Take 1 tablet by mouth daily.  ? Cholecalciferol 25 MCG (1000 UT) tablet Take 1,000 Units by mouth daily.  ? citalopram (CELEXA) 10 MG tablet Take 1 tablet by mouth once daily  ? clonazePAM (KLONOPIN) 0.5 MG tablet Take 0.5 mg by mouth as needed for anxiety.  ? cyclobenzaprine (FLEXERIL) 5 MG tablet Take 5 mg by mouth as needed.  ? ezetimibe (ZETIA) 10 MG tablet Take 1  tablet by mouth once daily  ? levothyroxine (SYNTHROID) 50 MCG tablet TAKE 1 TABLET BY MOUTH ONCE DAILY BEFORE  BREAKFAST  ? meloxicam (MOBIC) 15 MG tablet Take 1 tablet by mouth once daily  ? pantoprazole (PROTONIX) 40 MG tablet Take 1 tablet (40 mg total) by mouth daily.  ? potassium chloride (KLOR-CON) 10 MEQ tablet Take 10 mEq by mouth daily. Every other day for leg cramps  ? pravastatin (PRAVACHOL) 80 MG tablet Take 1 tablet by mouth once daily  ? ? ? ?  12/24/2021  ?  3:26 PM 04/19/2021  ?  8:54 AM 02/14/2021  ?  2:31 PM 12/01/2020  ?  2:11 PM  ?GAD 7 : Generalized Anxiety Score  ?Nervous, Anxious, on Edge 0 0 0 0  ?Control/stop worrying 0 0 0 0  ?Worry too much - different things 0 0 0 0  ?Trouble relaxing 0 0 0 0  ?Restless 0 0 0 0  ?Easily annoyed or irritable 0 0 0 0  ?Afraid - awful might happen 0 0 0 0  ?Total GAD 7 Score 0 0 0 0  ?Anxiety Difficulty Not difficult at all  Not difficult at all   ? ? ? ?  12/24/2021  ?  3:26 PM  ?Depression screen PHQ 2/9  ?Decreased Interest 0  ?Down, Depressed, Hopeless 0  ?PHQ - 2 Score 0  ?Altered sleeping 0  ?Tired, decreased energy 0  ?Change in appetite 0  ?Feeling bad or failure about yourself  0  ?Trouble concentrating 0  ?Moving slowly or fidgety/restless 0  ?Suicidal thoughts 0  ?PHQ-9 Score 0  ?Difficult doing work/chores Not difficult at all  ? ? ?BP Readings from Last 3 Encounters:  ?12/24/21 118/66  ?12/24/21 115/72  ?04/19/21 116/78  ? ? ?Physical Exam ?Vitals and nursing note reviewed.  ?Constitutional:   ?   Appearance: Normal appearance. She is well-developed.  ?Cardiovascular:  ?   Rate and Rhythm: Normal rate and regular rhythm.  ?   Heart sounds: Normal heart sounds.  ?Pulmonary:  ?   Effort: Pulmonary effort is normal. No respiratory distress.  ?   Breath sounds: Normal breath sounds.  ?Abdominal:  ?   General: Bowel sounds are normal.  ?   Palpations: Abdomen is soft.  ?   Tenderness: There is no abdominal tenderness. There is no right CVA tenderness, left  CVA tenderness, guarding or rebound.  ?Neurological:  ?   Mental Status: She is alert.  ? ? ?Wt Readings from Last 3 Encounters:  ?12/24/21 184 lb (83.5 kg)  ?12/24/21 184 lb (83.5 kg)  ?04/19/21 177 lb (80.3 kg)  ? ? ?BP 118/66   Pulse 71   Ht 5' 4"  (1.626 m)   Wt 184 lb (83.5 kg)   SpO2 95%  BMI 31.58 kg/m?  ? ?Assessment and Plan: ?1. UTI symptoms ?UA positive - recommend increased fluids ?Bactrim x 7 days. ?Follow up if no improvement ?- POC urinalysis w microscopic (non auto) ?- sulfamethoxazole-trimethoprim (BACTRIM DS) 800-160 MG tablet; Take 1 tablet by mouth 2 (two) times daily for 7 days.  Dispense: 14 tablet; Refill: 0 ? ? ?Partially dictated using Editor, commissioning. Any errors are unintentional. ? ?Halina Maidens, MD ?[facility] ?[facility] ? ?12/24/2021 ? ? ? ? ? ?

## 2021-12-24 NOTE — Progress Notes (Signed)
? ? ?Primary Care Physician: Glean Hess, MD ? ?Primary Gastroenterologist:  Dr. Lucilla Lame ? ?Chief Complaint  ?Patient presents with  ? Gastroesophageal Reflux  ?  Pt denies any new concerns and reports good Sx control on Pantoprazole  ? Medication Refill  ? ? ?HPI: Martha Walsh is a 71 y.o. female here for follow-up for GERD.  The patient is also in need of a refill of her pantoprazole.  The patient also states that she has been a lot more gassy with looser stools.  The patient does partake in dairy products.  There is no report of any unexplained weight loss fevers chills nausea vomiting black stools or bloody stools.  The patient had a history of adenomatous polyp at her last colonoscopy was normal and she was suggested not to have a repeat colonoscopy ? ?Past Medical History:  ?Diagnosis Date  ? GERD (gastroesophageal reflux disease)   ? Hyperlipidemia   ? Hypothyroid   ? ? ?Current Outpatient Medications  ?Medication Sig Dispense Refill  ? aspirin 81 MG EC tablet Take 81 mg by mouth daily.    ? ASPIRIN CHILDRENS 81 MG chewable tablet     ? Black Pepper-Turmeric 3-500 MG CAPS Take 1 capsule by mouth daily.    ? Calcium Carbonate-Vitamin D 600-400 MG-UNIT tablet Take 1 tablet by mouth daily.    ? Cholecalciferol 25 MCG (1000 UT) tablet Take 1,000 Units by mouth daily.    ? citalopram (CELEXA) 10 MG tablet Take 1 tablet by mouth once daily 90 tablet 2  ? clonazePAM (KLONOPIN) 0.5 MG tablet Take 0.5 mg by mouth as needed for anxiety.    ? cyclobenzaprine (FLEXERIL) 5 MG tablet Take 5 mg by mouth as needed.    ? ezetimibe (ZETIA) 10 MG tablet Take 1 tablet by mouth once daily 90 tablet 1  ? levothyroxine (SYNTHROID) 50 MCG tablet TAKE 1 TABLET BY MOUTH ONCE DAILY BEFORE  BREAKFAST 90 tablet 3  ? meloxicam (MOBIC) 15 MG tablet Take 1 tablet by mouth once daily 90 tablet 0  ? pantoprazole (PROTONIX) 40 MG tablet Take 1 tablet (40 mg total) by mouth daily. 90 tablet 0  ? potassium chloride (KLOR-CON) 10 MEQ  tablet Take 10 mEq by mouth daily. Every other day for leg cramps    ? pravastatin (PRAVACHOL) 80 MG tablet Take 1 tablet by mouth once daily 90 tablet 2  ? ?No current facility-administered medications for this visit.  ? ? ?Allergies as of 12/24/2021 - Review Complete 12/24/2021  ?Allergen Reaction Noted  ? Amoxicillin-pot clavulanate Other (See Comments) 11/22/2013  ? Lisinopril Cough 09/10/2016  ? ? ?ROS: ? ?General: Negative for anorexia, weight loss, fever, chills, fatigue, weakness. ?ENT: Negative for hoarseness, difficulty swallowing , nasal congestion. ?CV: Negative for chest pain, angina, palpitations, dyspnea on exertion, peripheral edema.  ?Respiratory: Negative for dyspnea at rest, dyspnea on exertion, cough, sputum, wheezing.  ?GI: See history of present illness. ?GU:  Negative for dysuria, hematuria, urinary incontinence, urinary frequency, nocturnal urination.  ?Endo: Negative for unusual weight change.  ?  ?Physical Examination: ? ? BP 115/72   Pulse 71   Temp 97.9 ?F (36.6 ?C) (Oral)   Wt 184 lb (83.5 kg)   BMI 31.58 kg/m?  ? ?General: Well-nourished, well-developed in no acute distress.  ?Eyes: No icterus. Conjunctivae pink. ?Neuro: Alert and oriented x 3.  Grossly intact. ?Psych: Alert and cooperative, normal mood and affect. ? ?Labs:  ?  ?Imaging Studies: ?No results found. ? ?  Assessment and Plan:  ? ?Martha Walsh is a 71 y.o. y/o female who comes in today in need of a refill of her pantoprazole.  The patient will have the medication refilled.  The patient has also been told to try and avoid milk products for 1 week to see if her stools become more solid.  The patient has been told that if they do not then we may reconsider another colonoscopy to investigate the cause of her change in bowel habits.  The patient has been explained the plan agrees with it. ? ? ? ? ?Lucilla Lame, MD. [facility] ? ? ? Note: This dictation was prepared with Dragon dictation along with smaller phrase technology. Any  transcriptional errors that result from this process are unintentional.  ?

## 2022-01-08 ENCOUNTER — Ambulatory Visit: Payer: Medicare Other | Admitting: Podiatry

## 2022-01-08 DIAGNOSIS — M722 Plantar fascial fibromatosis: Secondary | ICD-10-CM | POA: Diagnosis not present

## 2022-01-09 DIAGNOSIS — M9902 Segmental and somatic dysfunction of thoracic region: Secondary | ICD-10-CM | POA: Diagnosis not present

## 2022-01-09 DIAGNOSIS — M5033 Other cervical disc degeneration, cervicothoracic region: Secondary | ICD-10-CM | POA: Diagnosis not present

## 2022-01-09 DIAGNOSIS — M9901 Segmental and somatic dysfunction of cervical region: Secondary | ICD-10-CM | POA: Diagnosis not present

## 2022-01-09 DIAGNOSIS — M62830 Muscle spasm of back: Secondary | ICD-10-CM | POA: Diagnosis not present

## 2022-01-14 ENCOUNTER — Ambulatory Visit: Payer: Self-pay

## 2022-01-14 ENCOUNTER — Encounter: Payer: Self-pay | Admitting: Internal Medicine

## 2022-01-14 ENCOUNTER — Other Ambulatory Visit: Payer: Self-pay | Admitting: Internal Medicine

## 2022-01-14 ENCOUNTER — Ambulatory Visit (INDEPENDENT_AMBULATORY_CARE_PROVIDER_SITE_OTHER): Payer: Medicare Other | Admitting: Internal Medicine

## 2022-01-14 VITALS — BP 125/78 | HR 60 | Ht 64.0 in | Wt 182.4 lb

## 2022-01-14 DIAGNOSIS — R399 Unspecified symptoms and signs involving the genitourinary system: Secondary | ICD-10-CM | POA: Diagnosis not present

## 2022-01-14 LAB — POC URINALYSIS WITH MICROSCOPIC (NON AUTO)MANUAL RESULT
Bilirubin, UA: NEGATIVE
Crystals: 0
Glucose, UA: NEGATIVE
Ketones, UA: NEGATIVE
Mucus, UA: 0
Nitrite, UA: NEGATIVE
Protein, UA: NEGATIVE
RBC: 2 M/uL — AB (ref 4.04–5.48)
Spec Grav, UA: 1.015 (ref 1.010–1.025)
Urobilinogen, UA: 0.2 E.U./dL
pH, UA: 6 (ref 5.0–8.0)

## 2022-01-14 MED ORDER — CEFUROXIME AXETIL 250 MG PO TABS
250.0000 mg | ORAL_TABLET | Freq: Two times a day (BID) | ORAL | 0 refills | Status: DC
Start: 2022-01-14 — End: 2022-01-23

## 2022-01-14 NOTE — Telephone Encounter (Signed)
?  Chief Complaint: UTI ?Symptoms: urinary discomfort, itching, and cloudy urine ?Frequency: 2-3 weeks  ?Pertinent Negatives: NA ?Disposition: '[]'$ ED /'[]'$ Urgent Care (no appt availability in office) / '[x]'$ Appointment(In office/virtual)/ '[]'$  [facility]/ '[]'$ Home Care/ '[]'$ Refused Recommended Disposition /'[]'$ [facility]/ '[]'$  Follow-up with PCP ?Additional Notes: pt has completed all of abx and still symptoms present. Scheduled OV today at 1440.  ? ?Summary: UTI Reoccured Advice  ? Pt is calling because she was seen in office for UTI sx on 12/24/21. Pt has completed medication and feels that UTI. Current sx include urine is cloudy and discomfort with urination.   ?  ? ?Reason for Disposition ? [1] Taking antibiotic > 72 hours (3 days) for UTI AND [2] painful urination or frequency not improved ? ?Answer Assessment - Initial Assessment Questions ?1. SYMPTOM: "What's the main symptom you're concerned about?" (e.g., frequency, incontinence) ?    Urine cloudy, discomfort ?2. ONSET: "When did the  sx  start?" ?    Going on for 2-3 weeks  ?3. PAIN: "Is there any pain?" If Yes, ask: "How bad is it?" (Scale: 1-10; mild, moderate, severe) ?    3 ?4. CAUSE: "What do you think is causing the symptoms?" ?    UTI ?5. OTHER SYMPTOMS: "Do you have any other symptoms?" (e.g., fever, flank pain, blood in urine, pain with urination) ?    Itching, discomfort ? ?Protocols used: Urinary Symptoms-A-AH, Urinary Tract Infection on Antibiotic Follow-up Call - Female-A-AH ? ?

## 2022-01-14 NOTE — Progress Notes (Signed)
? ? ?Date:  01/14/2022  ? ?Name:  Martha Walsh   DOB:  1951/01/18   MRN:  [id number] ? ? ?Chief Complaint: Urinary Tract Infection ? ?Urinary Tract Infection  ?This is a recurrent problem. The current episode started yesterday (treated with Bactrim a month ago). The problem has been waxing and waning. The pain is mild. There has been no fever. Associated symptoms include frequency and hesitancy. Pertinent negatives include no chills, nausea or vomiting. Associated symptoms comments: Cloudy urine.  ? ?Lab Results  ?Component Value Date  ? NA 139 04/19/2021  ? K 4.5 04/19/2021  ? CO2 24 04/19/2021  ? GLUCOSE 99 04/19/2021  ? BUN 18 04/19/2021  ? CREATININE 1.08 (H) 04/19/2021  ? CALCIUM 9.7 04/19/2021  ? EGFR 55 (L) 04/19/2021  ? GFRNONAA 56 04/18/2020  ? ?Lab Results  ?Component Value Date  ? CHOL 224 (H) 04/19/2021  ? HDL 64 04/19/2021  ? LDLCALC 139 (H) 04/19/2021  ? TRIG 118 04/19/2021  ? CHOLHDL 3.5 04/19/2021  ? ?Lab Results  ?Component Value Date  ? TSH 2.950 04/19/2021  ? ?No results found for: HGBA1C ?Lab Results  ?Component Value Date  ? WBC 4.4 04/19/2021  ? HGB 12.6 04/19/2021  ? HCT 37.5 04/19/2021  ? MCV 85 04/19/2021  ? PLT 205 04/19/2021  ? ?Lab Results  ?Component Value Date  ? ALT 16 04/19/2021  ? AST 23 04/19/2021  ? ALKPHOS 72 04/19/2021  ? BILITOT 0.3 04/19/2021  ? ?No results found for: 25OHVITD2, [place], VD25OH  ? ?Review of Systems  ?Constitutional:  Negative for chills, fatigue and fever.  ?Respiratory:  Negative for chest tightness and shortness of breath.   ?Cardiovascular:  Negative for chest pain.  ?Gastrointestinal:  Negative for abdominal pain, diarrhea, nausea and vomiting.  ?Genitourinary:  Positive for frequency and hesitancy.  ?Psychiatric/Behavioral:  Negative for sleep disturbance. The patient is not nervous/anxious.   ? ?Patient Active Problem List  ? Diagnosis Date Noted  ? Low back pain 10/05/2021  ? History of nonmelanoma skin cancer 12/04/2020  ? Primary osteoarthritis of  right knee 12/01/2020  ? Murmur 01/03/2020  ? Generalized anxiety disorder 06/04/2019  ? Status post left partial knee replacement 03/12/2016  ? Acquired hypothyroidism 02/07/2016  ? Atypical mole 11/22/2015  ? Gallstone 10/04/2015  ? Gastro-esophageal reflux disease without esophagitis 08/04/2015  ? Nocturnal leg cramps 11/22/2013  ? Obesity (BMI 30-39.9) 11/22/2013  ? Mixed hyperlipidemia 09/28/2013  ? ? ?Allergies  ?Allergen Reactions  ? Clavulanic Acid Other (See Comments)  ?  Abdominal pain  ? Lisinopril Cough  ?   ?  ? ? ?Past Surgical History:  ?Procedure Laterality Date  ? APPENDECTOMY    ? ESOPHAGOGASTRODUODENOSCOPY (EGD) WITH PROPOFOL N/A 07/27/2020  ? Procedure: ESOPHAGOGASTRODUODENOSCOPY (EGD) WITH PROPOFOL;  Surgeon: Lucilla Lame, MD;  Location: [facility];  Service: Endoscopy;  Laterality: N/A;  ? JOINT REPLACEMENT    ? TONSILLECTOMY    ? ? ?Social History  ? ?Tobacco Use  ? Smoking status: Former  ?  Types: Cigarettes  ?  Quit date: 09/23/1998  ?  Years since quitting: 23.3  ? Smokeless tobacco: Never  ?Vaping Use  ? Vaping Use: Never used  ?Substance Use Topics  ? Alcohol use: Yes  ?  Comment: occasionally  ? Drug use: Never  ? ? ? ?Medication list has been reviewed and updated. ? ?Current Meds  ?Medication Sig  ? aspirin 81 MG EC tablet Take 81 mg by mouth daily.  ?  ASPIRIN CHILDRENS 81 MG chewable tablet   ? Black Pepper-Turmeric 3-500 MG CAPS Take 1 capsule by mouth daily.  ? Calcium Carbonate-Vitamin D 600-400 MG-UNIT tablet Take 1 tablet by mouth daily.  ? Cholecalciferol 25 MCG (1000 UT) tablet Take 1,000 Units by mouth daily.  ? citalopram (CELEXA) 10 MG tablet Take 1 tablet by mouth once daily  ? clonazePAM (KLONOPIN) 0.5 MG tablet Take 0.5 mg by mouth as needed for anxiety.  ? cyclobenzaprine (FLEXERIL) 5 MG tablet Take 5 mg by mouth as needed.  ? ezetimibe (ZETIA) 10 MG tablet Take 1 tablet by mouth once daily  ? levothyroxine (SYNTHROID) 50 MCG tablet TAKE 1 TABLET BY MOUTH ONCE  DAILY BEFORE  BREAKFAST  ? meloxicam (MOBIC) 15 MG tablet Take 1 tablet by mouth once daily  ? pantoprazole (PROTONIX) 40 MG tablet Take 1 tablet (40 mg total) by mouth daily.  ? potassium chloride (KLOR-CON) 10 MEQ tablet Take 10 mEq by mouth daily. Every other day for leg cramps  ? pravastatin (PRAVACHOL) 80 MG tablet Take 1 tablet by mouth once daily  ? ? ? ?  01/14/2022  ?  2:54 PM 12/24/2021  ?  3:26 PM 04/19/2021  ?  8:54 AM 02/14/2021  ?  2:31 PM  ?GAD 7 : Generalized Anxiety Score  ?Nervous, Anxious, on Edge 0 0 0 0  ?Control/stop worrying 0 0 0 0  ?Worry too much - different things 0 0 0 0  ?Trouble relaxing 0 0 0 0  ?Restless 0 0 0 0  ?Easily annoyed or irritable 0 0 0 0  ?Afraid - awful might happen 0 0 0 0  ?Total GAD 7 Score 0 0 0 0  ?Anxiety Difficulty Not difficult at all Not difficult at all  Not difficult at all  ? ? ? ?  01/14/2022  ?  2:54 PM  ?Depression screen PHQ 2/9  ?Decreased Interest 0  ?Down, Depressed, Hopeless 0  ?PHQ - 2 Score 0  ?Altered sleeping 0  ?Tired, decreased energy 0  ?Change in appetite 0  ?Feeling bad or failure about yourself  0  ?Trouble concentrating 0  ?Moving slowly or fidgety/restless 0  ?Suicidal thoughts 0  ?PHQ-9 Score 0  ?Difficult doing work/chores Not difficult at all  ? ? ?BP Readings from Last 3 Encounters:  ?01/14/22 125/78  ?12/24/21 118/66  ?12/24/21 115/72  ? ? ?Physical Exam ?Vitals and nursing note reviewed.  ?Constitutional:   ?   Appearance: Normal appearance. She is well-developed.  ?Cardiovascular:  ?   Rate and Rhythm: Normal rate and regular rhythm.  ?   Heart sounds: Normal heart sounds.  ?Pulmonary:  ?   Effort: Pulmonary effort is normal. No respiratory distress.  ?   Breath sounds: Normal breath sounds.  ?Abdominal:  ?   General: Bowel sounds are normal.  ?   Palpations: Abdomen is soft.  ?   Tenderness: There is abdominal tenderness in the suprapubic area. There is no right CVA tenderness, left CVA tenderness, guarding or rebound.  ?Neurological:  ?    Mental Status: She is alert.  ? ? ?Wt Readings from Last 3 Encounters:  ?01/14/22 182 lb 6.4 oz (82.7 kg)  ?12/24/21 184 lb (83.5 kg)  ?12/24/21 184 lb (83.5 kg)  ? ? ?BP 125/78   Pulse 60   Ht 5' 4"  (1.626 m)   Wt 182 lb 6.4 oz (82.7 kg)   SpO2 99%   BMI 31.31 kg/m?  ? ?Assessment and  Plan: ?1. UTI symptoms ?UA with large leukocytes. ?Will treat presumptively to avoid progression to pyelonephritis. ?Will notify pt of cx results when available. ?- POC urinalysis w microscopic (non auto) ?- Urine Culture ?- cefUROXime (CEFTIN) 250 MG tablet; Take 1 tablet (250 mg total) by mouth 2 (two) times daily with a meal for 7 days.  Dispense: 14 tablet; Refill: 0 ? ? ?Partially dictated using Editor, commissioning. Any errors are unintentional. ? ?Halina Maidens, MD ?[facility] ?[facility] ? ?01/14/2022 ? ? ? ? ? ?

## 2022-01-17 LAB — URINE CULTURE

## 2022-01-18 DIAGNOSIS — M1711 Unilateral primary osteoarthritis, right knee: Secondary | ICD-10-CM | POA: Diagnosis not present

## 2022-01-22 NOTE — Progress Notes (Signed)
? ?  Subjective: ?71 y.o. female presenting today for follow-up evaluation of plantar fasciitis to the right foot.  Patient states that she is doing well.  She has no pain or tenderness to the foot.  She has been walking is good supportive shoes and sneakers.  She continues to take meloxicam daily.  No new complaints at this time ? ? ?Past Medical History:  ?Diagnosis Date  ? GERD (gastroesophageal reflux disease)   ? Hyperlipidemia   ? Hypothyroid   ? ?Past Surgical History:  ?Procedure Laterality Date  ? APPENDECTOMY    ? ESOPHAGOGASTRODUODENOSCOPY (EGD) WITH PROPOFOL N/A 07/27/2020  ? Procedure: ESOPHAGOGASTRODUODENOSCOPY (EGD) WITH PROPOFOL;  Surgeon: Lucilla Lame, MD;  Location: [place];  Service: Endoscopy;  Laterality: N/A;  ? JOINT REPLACEMENT    ? TONSILLECTOMY    ? ?Allergies  ?Allergen Reactions  ? Clavulanic Acid Other (See Comments)  ?  Abdominal pain  ? Lisinopril Cough  ?   ?  ? ? ? ?Objective: ?Physical Exam ?General: The patient is alert and oriented x3 in no acute distress. ? ?Dermatology: Skin is warm, dry and supple bilateral lower extremities. Negative for open lesions or macerations bilateral.  ? ?Vascular: Dorsalis Pedis and Posterior Tibial pulses palpable bilateral.  Capillary fill time is immediate to all digits. ? ?Neurological: Epicritic and protective threshold intact bilateral.  ? ?Musculoskeletal: Negative for any significant tenderness to palpation to the plantar aspect of the right heel along the plantar fascia. All other joints range of motion within normal limits bilateral. Strength 5/5 in all groups bilateral.  ? ?Assessment: ?1. Plantar fasciitis right; resolved ? ?Plan of Care:  ?1. Patient evaluated. ?2.  Patient may now resume full activity no restrictions.  Recommend good supportive shoes and sneakers ?3.  Continue meloxicam 15 mg daily as prescribed by prescribing physician ?4.  Continue plantar fascial brace, copper fit, that the patient has been wearing.  It  appears to support the arch nicely ?5.  Return to clinic as needed ? ?Edrick Kins, DPM ?[facility] ? ?Dr. Edrick Kins, DPM  ?  ?[address]                                        ?[place], [place] [address]                ?Office [phone]  ?Fax [phone] ? ? ? ? ?

## 2022-01-23 ENCOUNTER — Other Ambulatory Visit: Payer: Self-pay

## 2022-01-23 ENCOUNTER — Ambulatory Visit: Payer: Self-pay

## 2022-01-23 DIAGNOSIS — R399 Unspecified symptoms and signs involving the genitourinary system: Secondary | ICD-10-CM

## 2022-01-23 MED ORDER — CEFUROXIME AXETIL 250 MG PO TABS: 250.0000 mg | ORAL_TABLET | Freq: Two times a day (BID) | ORAL | 0 refills | Status: AC

## 2022-01-23 NOTE — Telephone Encounter (Signed)
Reason for Disposition ? [1] Taking antibiotic > 72 hours (3 days) for UTI AND [2] painful urination or frequency not improved ?   Having irritation around outer opening of bladder and cloudy urine after completing antibiotic 2 days ago. ? ?Answer Assessment - Initial Assessment Questions ?1. ANTIBIOTIC: "What antibiotic are you taking?" "How many times per day?" ?    Finished taking the antibiotic 2 days ago for UTI.    I'm feeling irritated around opening of my bladder and my urine is cloudy.  No smell, frequency.   E Coli positive per my culture.   My symptoms are coming back since completing the antibiotic.   ?2. DURATION: "When was the antibiotic started?" ?    Last antibiotic was 2 days ago. ?3.  SYMPTOM: "What is the main symptom you are concerned about?" ?    Irritation constantly not with urination.   Cloudy urine also. ?4. FEVER: "Do you have a fever?" If Yes, ask: "What is it, how was it measured, and when did it start?" ?    No ?5. OTHER SYMPTOMS: "Do you have any other symptoms?" (e.g., flank pain, vaginal discharge, blood in urine) ?    Denies other symptoms other than constant irritation around the bladder area and cloudy urine. ? ?Protocols used: Urinary Tract Infection on Antibiotic Follow-up Call - [facility] ? ?Chief Complaint: Constant irritation around bladder opening and urine has become cloudy after completing the antibiotic 2 days ago for E. Coli UTI. ?Symptoms: Irritation and cloudy urine. ?Frequency: Constant ?Pertinent Negatives: Patient denies smell to urine, frequency, or burning with urination except towards the end around the outer area of the bladder opening. ?Disposition: '[]'$ ED /'[]'$ Urgent Care (no appt availability in office) / '[]'$ Appointment(In office/virtual)/ '[]'$  [facility]/ '[]'$ Home Care/ '[]'$ Refused Recommended Disposition /'[]'$ [facility]/ '[x]'$  Follow-up with PCP ?Additional Notes: Message sent to Dr. Army Melia.    ?

## 2022-01-23 NOTE — Telephone Encounter (Signed)
Called and spoke with patient. Informed Dr Army Melia sent in additional 5 days of abx. Patient verbalized understanding.  ? ?- Fani Rotondo ?

## 2022-01-23 NOTE — Telephone Encounter (Signed)
Pt called in stating she was recently seen about  UTI, and was given something, but is still having symptoms, and requested to speak with a nurse, please advise ? ?Left message to call back about symptoms. ?

## 2022-02-06 DIAGNOSIS — M9901 Segmental and somatic dysfunction of cervical region: Secondary | ICD-10-CM | POA: Diagnosis not present

## 2022-02-06 DIAGNOSIS — M62830 Muscle spasm of back: Secondary | ICD-10-CM | POA: Diagnosis not present

## 2022-02-06 DIAGNOSIS — M5033 Other cervical disc degeneration, cervicothoracic region: Secondary | ICD-10-CM | POA: Diagnosis not present

## 2022-02-06 DIAGNOSIS — M9902 Segmental and somatic dysfunction of thoracic region: Secondary | ICD-10-CM | POA: Diagnosis not present

## 2022-02-08 DIAGNOSIS — M1711 Unilateral primary osteoarthritis, right knee: Secondary | ICD-10-CM | POA: Diagnosis not present

## 2022-03-06 ENCOUNTER — Other Ambulatory Visit: Payer: Self-pay | Admitting: Internal Medicine

## 2022-03-06 ENCOUNTER — Ambulatory Visit (INDEPENDENT_AMBULATORY_CARE_PROVIDER_SITE_OTHER): Payer: Medicare Other

## 2022-03-06 DIAGNOSIS — Z0000 Encounter for general adult medical examination without abnormal findings: Secondary | ICD-10-CM

## 2022-03-06 DIAGNOSIS — M5033 Other cervical disc degeneration, cervicothoracic region: Secondary | ICD-10-CM | POA: Diagnosis not present

## 2022-03-06 DIAGNOSIS — M9902 Segmental and somatic dysfunction of thoracic region: Secondary | ICD-10-CM | POA: Diagnosis not present

## 2022-03-06 DIAGNOSIS — E782 Mixed hyperlipidemia: Secondary | ICD-10-CM

## 2022-03-06 DIAGNOSIS — M62830 Muscle spasm of back: Secondary | ICD-10-CM | POA: Diagnosis not present

## 2022-03-06 DIAGNOSIS — M9901 Segmental and somatic dysfunction of cervical region: Secondary | ICD-10-CM | POA: Diagnosis not present

## 2022-03-06 NOTE — Patient Instructions (Signed)
Martha Walsh , Thank you for taking time to come for your Medicare Wellness Visit. I appreciate your ongoing commitment to your health goals. Please review the following plan we discussed and let me know if I can assist you in the future.   Screening recommendations/referrals: Colonoscopy: done 2019; repeat as directed Mammogram: done 03/27/21. Scheduled for 03/28/21 Bone Density: done 03/13/21 Recommended yearly ophthalmology/optometry visit for glaucoma screening and checkup Recommended yearly dental visit for hygiene and checkup  Vaccinations: Influenza vaccine: due fall 2023 Pneumococcal vaccine: done 01/16/15 Tdap vaccine: done 07/29/20 Shingles vaccine: done 10/18/21; due for second dose   Covid-19:done 11/05/19, 11/26/19 & 09/18/20  Advanced directives: Advance directive discussed with you today. Even though you declined this today please call our office should you change your mind and we can give you the proper paperwork for you to fill out.   Conditions/risks identified: recommend increasing activity as tolerated  Next appointment: Follow up in one year for your annual wellness visit    Preventive Care 65 Years and Older, Female Preventive care refers to lifestyle choices and visits with your health care provider that can promote health and wellness. What does preventive care include? A yearly physical exam. This is also called an annual well check. Dental exams once or twice a year. Routine eye exams. Ask your health care provider how often you should have your eyes checked. Personal lifestyle choices, including: Daily care of your teeth and gums. Regular physical activity. Eating a healthy diet. Avoiding tobacco and drug use. Limiting alcohol use. Practicing safe sex. Taking low-dose aspirin every day. Taking vitamin and mineral supplements as recommended by your health care provider. What happens during an annual well check? The services and screenings done by your health care  provider during your annual well check will depend on your age, overall health, lifestyle risk factors, and family history of disease. Counseling  Your health care provider may ask you questions about your: Alcohol use. Tobacco use. Drug use. Emotional well-being. Home and relationship well-being. Sexual activity. Eating habits. History of falls. Memory and ability to understand (cognition). Work and work Statistician. Reproductive health. Screening  You may have the following tests or measurements: Height, weight, and BMI. Blood pressure. Lipid and cholesterol levels. These may be checked every 5 years, or more frequently if you are over 30 years old. Skin check. Lung cancer screening. You may have this screening every year starting at age 55 if you have a 30-pack-year history of smoking and currently smoke or have quit within the past 15 years. Fecal occult blood test (FOBT) of the stool. You may have this test every year starting at age 32. Flexible sigmoidoscopy or colonoscopy. You may have a sigmoidoscopy every 5 years or a colonoscopy every 10 years starting at age 81. Hepatitis C blood test. Hepatitis B blood test. Sexually transmitted disease (STD) testing. Diabetes screening. This is done by checking your blood sugar (glucose) after you have not eaten for a while (fasting). You may have this done every 1-3 years. Bone density scan. This is done to screen for osteoporosis. You may have this done starting at age 71. Mammogram. This may be done every 1-2 years. Talk to your health care provider about how often you should have regular mammograms. Talk with your health care provider about your test results, treatment options, and if necessary, the need for more tests. Vaccines  Your health care provider may recommend certain vaccines, such as: Influenza vaccine. This is recommended every year. Tetanus,  diphtheria, and acellular pertussis (Tdap, Td) vaccine. You may need a Td  booster every 10 years. Zoster vaccine. You may need this after age 89. Pneumococcal 13-valent conjugate (PCV13) vaccine. One dose is recommended after age 58. Pneumococcal polysaccharide (PPSV23) vaccine. One dose is recommended after age 75. Talk to your health care provider about which screenings and vaccines you need and how often you need them. This information is not intended to replace advice given to you by your health care provider. Make sure you discuss any questions you have with your health care provider. Document Released: 10/06/2015 Document Revised: 05/29/2016 Document Reviewed: 07/11/2015 Elsevier Interactive Patient Education  2017 [employer] Prevention in the Home Falls can cause injuries. They can happen to people of all ages. There are many things you can do to make your home safe and to help prevent falls. What can I do on the outside of my home? Regularly fix the edges of walkways and driveways and fix any cracks. Remove anything that might make you trip as you walk through a door, such as a raised step or threshold. Trim any bushes or trees on the path to your home. Use bright outdoor lighting. Clear any walking paths of anything that might make someone trip, such as rocks or tools. Regularly check to see if handrails are loose or broken. Make sure that both sides of any steps have handrails. Any raised decks and porches should have guardrails on the edges. Have any leaves, snow, or ice cleared regularly. Use sand or salt on walking paths during winter. Clean up any spills in your garage right away. This includes oil or grease spills. What can I do in the bathroom? Use night lights. Install grab bars by the toilet and in the tub and shower. Do not use towel bars as grab bars. Use non-skid mats or decals in the tub or shower. If you need to sit down in the shower, use a plastic, non-slip stool. Keep the floor dry. Clean up any water that spills on the floor  as soon as it happens. Remove soap buildup in the tub or shower regularly. Attach bath mats securely with double-sided non-slip rug tape. Do not have throw rugs and other things on the floor that can make you trip. What can I do in the bedroom? Use night lights. Make sure that you have a light by your bed that is easy to reach. Do not use any sheets or blankets that are too big for your bed. They should not hang down onto the floor. Have a firm chair that has side arms. You can use this for support while you get dressed. Do not have throw rugs and other things on the floor that can make you trip. What can I do in the kitchen? Clean up any spills right away. Avoid walking on wet floors. Keep items that you use a lot in easy-to-reach places. If you need to reach something above you, use a strong step stool that has a grab bar. Keep electrical cords out of the way. Do not use floor polish or wax that makes floors slippery. If you must use wax, use non-skid floor wax. Do not have throw rugs and other things on the floor that can make you trip. What can I do with my stairs? Do not leave any items on the stairs. Make sure that there are handrails on both sides of the stairs and use them. Fix handrails that are broken or loose. Make  sure that handrails are as long as the stairways. Check any carpeting to make sure that it is firmly attached to the stairs. Fix any carpet that is loose or worn. Avoid having throw rugs at the top or bottom of the stairs. If you do have throw rugs, attach them to the floor with carpet tape. Make sure that you have a light switch at the top of the stairs and the bottom of the stairs. If you do not have them, ask someone to add them for you. What else can I do to help prevent falls? Wear shoes that: Do not have high heels. Have rubber bottoms. Are comfortable and fit you well. Are closed at the toe. Do not wear sandals. If you use a stepladder: Make sure that it is  fully opened. Do not climb a closed stepladder. Make sure that both sides of the stepladder are locked into place. Ask someone to hold it for you, if possible. Clearly mark and make sure that you can see: Any grab bars or handrails. First and last steps. Where the edge of each step is. Use tools that help you move around (mobility aids) if they are needed. These include: Canes. Walkers. Scooters. Crutches. Turn on the lights when you go into a dark area. Replace any light bulbs as soon as they burn out. Set up your furniture so you have a clear path. Avoid moving your furniture around. If any of your floors are uneven, fix them. If there are any pets around you, be aware of where they are. Review your medicines with your doctor. Some medicines can make you feel dizzy. This can increase your chance of falling. Ask your doctor what other things that you can do to help prevent falls. This information is not intended to replace advice given to you by your health care provider. Make sure you discuss any questions you have with your health care provider. Document Released: 07/06/2009 Document Revised: 02/15/2016 Document Reviewed: 10/14/2014 Elsevier Interactive Patient Education  2017 [employer].

## 2022-03-06 NOTE — Telephone Encounter (Signed)
Requested Prescriptions  Pending Prescriptions Disp Refills  . ezetimibe (ZETIA) 10 MG tablet [Pharmacy Med Name: Ezetimibe 10 MG Oral Tablet] 90 tablet 0    Sig: Take 1 tablet by mouth once daily     Cardiovascular:  Antilipid - Sterol Transport Inhibitors Failed - 03/06/2022 10:29 AM      Failed - Lipid Panel in normal range within the last 12 months    Cholesterol, Total  Date Value Ref Range Status  04/19/2021 224 (H) 100 - 199 mg/dL Final   LDL Chol Calc (NIH)  Date Value Ref Range Status  04/19/2021 139 (H) 0 - 99 mg/dL Final   HDL  Date Value Ref Range Status  04/19/2021 64 >39 mg/dL Final   Triglycerides  Date Value Ref Range Status  04/19/2021 118 0 - 149 mg/dL Final         Passed - AST in normal range and within 360 days    AST  Date Value Ref Range Status  04/19/2021 23 0 - 40 IU/L Final         Passed - ALT in normal range and within 360 days    ALT  Date Value Ref Range Status  04/19/2021 16 0 - 32 IU/L Final         Passed - Patient is not pregnant      Passed - Valid encounter within last 12 months    Recent Outpatient Visits          1 month ago UTI symptoms   [facility], [facility]   2 months ago UTI symptoms   [facility] Glean Hess, MD   10 months ago Annual physical exam   [facility] Glean Hess, MD   1 year ago Muscle weakness   [facility] Glean Hess, MD   1 year ago Acquired hypothyroidism   [facility] Glean Hess, MD      Future Appointments            In 2 months Army Melia Jesse Sans, MD [facility], [facility]

## 2022-03-06 NOTE — Progress Notes (Signed)
Subjective:   Martha Walsh is a 71 y.o. female who presents for Medicare Annual (Subsequent) preventive examination.  Virtual Visit via Telephone Note  I connected with  Martha Walsh on 03/06/22 at  9:45 AM EDT by telephone and verified that I am speaking with the correct person using two identifiers.  Location: Patient: home Provider: [facility] Persons participating in the virtual visit: [place]   I discussed the limitations, risks, security and privacy concerns of performing an evaluation and management service by telephone and the availability of in person appointments. The patient expressed understanding and agreed to proceed.  Interactive audio and video telecommunications were attempted between this nurse and patient, however failed, due to patient having technical difficulties OR patient did not have access to video capability.  We continued and completed visit with audio only.  Some vital signs may be absent or patient reported.   Clemetine Marker, LPN   Review of Systems    Cardiac Risk Factors include: advanced age (>38mn, >>4women);dyslipidemia     Objective:    There were no vitals filed for this visit. There is no height or weight on file to calculate BMI.     03/06/2022    9:53 AM 03/05/2021   10:15 AM 07/27/2020    9:10 AM  Advanced Directives  Does Patient Have a Medical Advance Directive? No No No  Would patient like information on creating a medical advance directive? No - Patient declined Yes (MAU/Ambulatory/Procedural Areas - Information given) No - Patient declined    Current Medications (verified) Outpatient Encounter Medications as of 03/06/2022  Medication Sig   Calcium Carbonate-Vitamin D 600-400 MG-UNIT tablet Take 1 tablet by mouth daily.   Cholecalciferol 25 MCG (1000 UT) tablet Take 1,000 Units by mouth daily.   citalopram (CELEXA) 10 MG tablet Take 1 tablet by mouth once daily   clonazePAM (KLONOPIN) 0.5 MG tablet Take 0.5 mg by  mouth as needed for anxiety.   ezetimibe (ZETIA) 10 MG tablet Take 1 tablet by mouth once daily   ibuprofen (ADVIL) 800 MG tablet Take 800 mg by mouth 3 (three) times daily. PRN   levothyroxine (SYNTHROID) 50 MCG tablet TAKE 1 TABLET BY MOUTH ONCE DAILY BEFORE  BREAKFAST   meloxicam (MOBIC) 15 MG tablet Take 1 tablet by mouth once daily   pantoprazole (PROTONIX) 40 MG tablet Take 1 tablet (40 mg total) by mouth daily.   potassium chloride (KLOR-CON) 10 MEQ tablet Take 10 mEq by mouth daily. Every other day for leg cramps   pravastatin (PRAVACHOL) 80 MG tablet Take 1 tablet by mouth once daily   cyclobenzaprine (FLEXERIL) 5 MG tablet Take 5 mg by mouth as needed. (Patient not taking: Reported on 03/06/2022)   [DISCONTINUED] aspirin 81 MG EC tablet Take 81 mg by mouth daily.   [DISCONTINUED] ASPIRIN CHILDRENS 81 MG chewable tablet    [DISCONTINUED] Black Pepper-Turmeric 3-500 MG CAPS Take 1 capsule by mouth daily.   No facility-administered encounter medications on file as of 03/06/2022.    Allergies (verified) Clavulanic acid and Lisinopril   History: Past Medical History:  Diagnosis Date   GERD (gastroesophageal reflux disease)    Hyperlipidemia    Hypothyroid    Past Surgical History:  Procedure Laterality Date   APPENDECTOMY     ESOPHAGOGASTRODUODENOSCOPY (EGD) WITH PROPOFOL N/A 07/27/2020   Procedure: ESOPHAGOGASTRODUODENOSCOPY (EGD) WITH PROPOFOL;  Surgeon: WLucilla Lame MD;  [place] [facility]  Service: Endoscopy;  Laterality: N/A;   JOINT REPLACEMENT  TONSILLECTOMY     Family History  Problem Relation Age of Onset   Heart disease Father    Social History   Socioeconomic History   Marital status: Married    Spouse name: Not on file   Number of children: 3   Years of education: Not on file   Highest education level: Associate degree: academic program  Occupational History   Occupation: retired  Tobacco Use   Smoking status: Former    Types: Cigarettes     Quit date: 09/23/1998    Years since quitting: 23.4   Smokeless tobacco: Never  Vaping Use   Vaping Use: Never used  Substance and Sexual Activity   Alcohol use: Yes    Comment: occasionally   Drug use: Never   Sexual activity: Not Currently  Other Topics Concern   Not on file  Social History Narrative   Not on file   Social Determinants of Health   Financial Resource Strain: Low Risk  (03/06/2022)   Overall Financial Resource Strain (CARDIA)    Difficulty of Paying Living Expenses: Not hard at all  Food Insecurity: No Food Insecurity (03/06/2022)   Hunger Vital Sign    Worried About Running Out of Food in the Last Year: Never true    [place] in the Last Year: Never true  Transportation Needs: No Transportation Needs (03/06/2022)   PRAPARE - Hydrologist (Medical): No    Lack of Transportation (Non-Medical): No  Physical Activity: Inactive (03/06/2022)   Exercise Vital Sign    Days of Exercise per Week: 0 days    Minutes of Exercise per Session: 0 min  Stress: No Stress Concern Present (03/06/2022)   [place] of Stress : Not at all  Social Connections: [place] (03/06/2022)   Social Connection and Isolation Panel [NHANES]    Frequency of Communication with Friends and Family: More than three times a week    Frequency of Social Gatherings with Friends and Family: More than three times a week    Attends Religious Services: More than 4 times per year    Active Member of [employer] or Organizations: Yes    Attends Music therapist: More than 4 times per year    Marital Status: Married    Tobacco Counseling Counseling given: Not Answered   Clinical Intake:  Pre-visit preparation completed: Yes  Pain : No/denies pain     Nutritional Risks: None Diabetes: No  How often do you need to have someone help you when you read instructions,  pamphlets, or other written materials from your doctor or pharmacy?: 1 - Never   Interpreter Needed?: No  Information entered by :: Clemetine Marker LPN   Activities of Daily Living    03/06/2022    9:54 AM 01/14/2022    2:54 PM  In your present state of health, do you have any difficulty performing the following activities:  Hearing? 0 0  Vision? 1 0  Difficulty concentrating or making decisions? 0 0  Walking or climbing stairs? 0 0  Dressing or bathing? 0 0  Doing errands, shopping? 0 0  Preparing Food and eating ? N   Using the Toilet? N   In the past six months, have you accidently leaked urine? N   Do you have problems with loss of bowel control? N   Managing your Medications? N   Managing your Finances? N  Housekeeping or managing your Housekeeping? N     Patient Care Team: Glean Hess, MD as PCP - General (Internal Medicine) Lucilla Lame, MD as Consulting Physician (Gastroenterology) Earnestine Leys, MD (Orthopedic Surgery) Baxter Kail, MD (Dermatology)  Indicate any recent Medical Services you may have received from other than [facility] providers in the past year (date may be approximate).     Assessment:   This is a routine wellness examination for Dayne.  Hearing/Vision screen Hearing Screening - Comments:: Pt denies hearing difficulty Vision Screening - Comments:: Annual vision screenings at [facility]  Dietary issues and exercise activities discussed: Current Exercise Habits: The patient does not participate in regular exercise at present, Exercise limited by: orthopedic condition(s)   Goals Addressed   None    Depression Screen    03/06/2022    9:52 AM 01/14/2022    2:54 PM 12/24/2021    3:26 PM 04/19/2021    8:54 AM 03/05/2021   10:15 AM 02/14/2021    2:31 PM 12/01/2020    2:11 PM  PHQ 2/9 Scores  PHQ - 2 Score 0 0 0 0 0 0 0  PHQ- 9 Score  0 0 0  0 0    Fall Risk    03/06/2022    9:53 AM 01/14/2022    2:54 PM 12/24/2021    3:26 PM  04/19/2021    8:55 AM 03/05/2021   10:18 AM  Fall Risk   Falls in the past year? 0 0 0 0 0  Number falls in past yr: 0 0 0 0 0  Injury with Fall? 0 0 0 0 0  Risk for fall due to : No Fall Risks No Fall Risks No Fall Risks No Fall Risks No Fall Risks  Follow up Falls prevention discussed Falls evaluation completed Falls evaluation completed Falls evaluation completed Falls prevention discussed    FALL RISK PREVENTION PERTAINING TO THE HOME:  Any stairs in or around the home? Yes  If so, are there any without handrails? No  Home free of loose throw rugs in walkways, pet beds, electrical cords, etc? Yes  Adequate lighting in your home to reduce risk of falls? Yes   ASSISTIVE DEVICES UTILIZED TO PREVENT FALLS:  Life alert? No  Use of a cane, walker or w/c? Yes  - cane as needed Grab bars in the bathroom? Yes  Shower chair or bench in shower? Yes  Elevated toilet seat or a handicapped toilet? Yes   TIMED UP AND GO:  Was the test performed? No . Telephonic visit.   Cognitive Function: Normal cognitive status assessed by direct observation by this Nurse Health Advisor. No abnormalities found.          Immunizations Immunization History  Administered Date(s) Administered   Influenza, High Dose Seasonal PF 06/03/2019   Influenza, Seasonal, Injecte, Preservative Fre 06/06/2010   Influenza,inj,Quad PF,6+ Mos 09/10/2016, 10/14/2017, 06/15/2018   Influenza-Unspecified 06/03/2019, 06/07/2020   PFIZER(Purple Top)SARS-COV-2 Vaccination 11/05/2019, 11/26/2019, 09/18/2020   Pneumococcal Conjugate-13 11/22/2013   Pneumococcal Polysaccharide-23 01/16/2015   Tdap 06/06/2010, 07/29/2020   Zoster Recombinat (Shingrix) 10/18/2021   Zoster, Live 02/07/2016    TDAP status: Up to date  Flu Vaccine status: Up to date  Pneumococcal vaccine status: Up to date  Covid-19 vaccine status: Completed vaccines  Qualifies for Shingles Vaccine? Yes   Zostavax completed Yes   Shingrix Completed?:  Yes due for second dose  Screening Tests Health Maintenance  Topic Date Due   Hepatitis C Screening  Never done   COVID-19 Vaccine (4 - Booster for Pfizer series) 11/13/2020   Zoster Vaccines- Shingrix (2 of 2) 12/13/2021   INFLUENZA VACCINE  04/23/2022   MAMMOGRAM  03/28/2023   COLONOSCOPY (Pts 45-24yr Insurance coverage will need to be confirmed)  09/24/2027   TETANUS/TDAP  07/29/2030   Pneumonia Vaccine 71 Years old  Completed   DEXA SCAN  Completed   HPV VACCINES  Aged Out    Health Maintenance  Health Maintenance Due  Topic Date Due   Hepatitis C Screening  Never done   COVID-19 Vaccine (4 - Booster for PPierreseries) 11/13/2020   Zoster Vaccines- Shingrix (2 of 2) 12/13/2021    Colorectal cancer screening: Type of screening: Colonoscopy. Completed 2019. Repeat every 10 years  Mammogram status: Completed 03/27/21. Repeat every year. Scheduled for 03/28/21  Bone Density status: Completed 03/13/21. Results reflect: Bone density results: NORMAL. Repeat every 3-5 years.  Lung Cancer Screening: (Low Dose CT Chest recommended if Age 73-80years, 30 pack-year currently smoking OR have quit w/in 15years.) does not qualify.   Additional Screening:  Hepatitis C Screening: does qualify; due  Vision Screening: Recommended annual ophthalmology exams for early detection of glaucoma and other disorders of the eye. Is the patient up to date with their annual eye exam?  Yes  Who is the provider or what is the name of the office in which the patient attends annual eye exams? [facility]   Dental Screening: Recommended annual dental exams for proper oral hygiene  Community Resource Referral / Chronic Care Management: CRR required this visit?  No   CCM required this visit?  No      Plan:     I have personally reviewed and noted the following in the patient's chart:   Medical and social history Use of alcohol, tobacco or illicit drugs  Current medications and  supplements including opioid prescriptions.  Functional ability and status Nutritional status Physical activity Advanced directives List of other physicians Hospitalizations, surgeries, and ER visits in previous 12 months Vitals Screenings to include cognitive, depression, and falls Referrals and appointments  In addition, I have reviewed and discussed with patient certain preventive protocols, quality metrics, and best practice recommendations. A written personalized care plan for preventive services as well as general preventive health recommendations were provided to patient.     KClemetine Marker LPN   [id number]  Nurse Notes: pt request refill for zetia, states pharmacy told her to contact office. CPE scheduled for 05/06/22.

## 2022-03-28 LAB — HM MAMMOGRAPHY

## 2022-04-10 DIAGNOSIS — M9901 Segmental and somatic dysfunction of cervical region: Secondary | ICD-10-CM | POA: Diagnosis not present

## 2022-04-10 DIAGNOSIS — M62830 Muscle spasm of back: Secondary | ICD-10-CM | POA: Diagnosis not present

## 2022-04-10 DIAGNOSIS — M5033 Other cervical disc degeneration, cervicothoracic region: Secondary | ICD-10-CM | POA: Diagnosis not present

## 2022-04-10 DIAGNOSIS — M9902 Segmental and somatic dysfunction of thoracic region: Secondary | ICD-10-CM | POA: Diagnosis not present

## 2022-05-04 ENCOUNTER — Other Ambulatory Visit: Payer: Self-pay | Admitting: Internal Medicine

## 2022-05-06 ENCOUNTER — Encounter: Payer: Self-pay | Admitting: Internal Medicine

## 2022-05-06 ENCOUNTER — Ambulatory Visit (INDEPENDENT_AMBULATORY_CARE_PROVIDER_SITE_OTHER): Payer: Medicare Other | Admitting: Internal Medicine

## 2022-05-06 VITALS — BP 120/88 | HR 59 | Ht 64.0 in | Wt 182.0 lb

## 2022-05-06 DIAGNOSIS — E782 Mixed hyperlipidemia: Secondary | ICD-10-CM

## 2022-05-06 DIAGNOSIS — Z1231 Encounter for screening mammogram for malignant neoplasm of breast: Secondary | ICD-10-CM

## 2022-05-06 DIAGNOSIS — F411 Generalized anxiety disorder: Secondary | ICD-10-CM | POA: Diagnosis not present

## 2022-05-06 DIAGNOSIS — E039 Hypothyroidism, unspecified: Secondary | ICD-10-CM

## 2022-05-06 DIAGNOSIS — Z0000 Encounter for general adult medical examination without abnormal findings: Secondary | ICD-10-CM | POA: Diagnosis not present

## 2022-05-06 DIAGNOSIS — M1711 Unilateral primary osteoarthritis, right knee: Secondary | ICD-10-CM | POA: Diagnosis not present

## 2022-05-06 DIAGNOSIS — K219 Gastro-esophageal reflux disease without esophagitis: Secondary | ICD-10-CM | POA: Diagnosis not present

## 2022-05-06 DIAGNOSIS — Z1159 Encounter for screening for other viral diseases: Secondary | ICD-10-CM | POA: Diagnosis not present

## 2022-05-06 MED ORDER — PRAVASTATIN SODIUM 80 MG PO TABS: 80.0000 mg | ORAL_TABLET | Freq: Every day | ORAL | 3 refills | Status: DC

## 2022-05-06 MED ORDER — CITALOPRAM HYDROBROMIDE 10 MG PO TABS: 10.0000 mg | ORAL_TABLET | Freq: Every day | ORAL | 3 refills | Status: DC

## 2022-05-06 MED ORDER — MELOXICAM 15 MG PO TABS: 15.0000 mg | ORAL_TABLET | Freq: Every day | ORAL | 1 refills | Status: DC

## 2022-05-06 MED ORDER — EZETIMIBE 10 MG PO TABS: 10.0000 mg | ORAL_TABLET | Freq: Every day | ORAL | 3 refills | Status: DC

## 2022-05-06 NOTE — Progress Notes (Signed)
Date:  05/06/2022   Name:  Martha Walsh   DOB:  25-Apr-1951   MRN:  [id number]   Chief Complaint: Annual Exam Martha Walsh is a 71 y.o. female who presents today for her Complete Annual Exam. She feels well. She reports exercising. She reports she is sleeping well. Breast complaints - none.  Mammogram: 03/2022 DEXA: 02/2021 Normal at [place] Pap smear: aged out Colonoscopy: 2019  Health Maintenance Due  Topic Date Due   Hepatitis C Screening  Never done   COVID-19 Vaccine (4 - Pfizer risk series) 11/13/2020   INFLUENZA VACCINE  04/23/2022    Immunization History  Administered Date(s) Administered   Influenza, High Dose Seasonal PF 06/03/2019   Influenza, Seasonal, Injecte, Preservative Fre 06/06/2010   Influenza,inj,Quad PF,6+ Mos 09/10/2016, 10/14/2017, 06/15/2018   Influenza-Unspecified 06/03/2019, 06/07/2020   PFIZER(Purple Top)SARS-COV-2 Vaccination 11/05/2019, 11/26/2019, 09/18/2020   Pneumococcal Conjugate-13 11/22/2013   Pneumococcal Polysaccharide-23 01/16/2015   Tdap 06/06/2010, 07/29/2020   Zoster Recombinat (Shingrix) 10/18/2021, 04/08/2022   Zoster, Live 02/07/2016    Hyperlipidemia This is a chronic problem. The problem is controlled. Pertinent negatives include no chest pain or shortness of breath. Current antihyperlipidemic treatment includes statins and ezetimibe.  Thyroid Problem Presents for follow-up visit. Patient reports no anxiety, constipation, diarrhea, fatigue, palpitations or tremors. The symptoms have been stable. Her past medical history is significant for hyperlipidemia.  Anxiety Presents for follow-up visit. Patient reports no chest pain, dizziness, nervous/anxious behavior, palpitations or shortness of breath. Symptoms occur occasionally.   Compliance with medications is 76-100% (Celexa).  Gastroesophageal Reflux She complains of heartburn. She reports no abdominal pain, no chest pain, no coughing or no wheezing. This is a recurrent problem. The  problem occurs rarely. The problem has been unchanged. Pertinent negatives include no fatigue. She has tried a PPI for the symptoms.    Lab Results  Component Value Date   NA 139 04/19/2021   K 4.5 04/19/2021   CO2 24 04/19/2021   GLUCOSE 99 04/19/2021   BUN 18 04/19/2021   CREATININE 1.08 (H) 04/19/2021   CALCIUM 9.7 04/19/2021   EGFR 55 (L) 04/19/2021   GFRNONAA 56 04/18/2020   Lab Results  Component Value Date   CHOL 224 (H) 04/19/2021   HDL 64 04/19/2021   LDLCALC 139 (H) 04/19/2021   TRIG 118 04/19/2021   CHOLHDL 3.5 04/19/2021   Lab Results  Component Value Date   TSH 2.950 04/19/2021   No results found for: "HGBA1C" Lab Results  Component Value Date   WBC 4.4 04/19/2021   HGB 12.6 04/19/2021   HCT 37.5 04/19/2021   MCV 85 04/19/2021   PLT 205 04/19/2021   Lab Results  Component Value Date   ALT 16 04/19/2021   AST 23 04/19/2021   ALKPHOS 72 04/19/2021   BILITOT 0.3 04/19/2021   No results found for: "25OHVITD2", "25OHVITD3", "VD25OH"   Review of Systems  Constitutional:  Negative for chills, fatigue and fever.  HENT:  Negative for congestion, hearing loss, tinnitus, trouble swallowing and voice change.   Eyes:  Negative for visual disturbance.  Respiratory:  Negative for cough, chest tightness, shortness of breath and wheezing.   Cardiovascular:  Negative for chest pain, palpitations and leg swelling.  Gastrointestinal:  Positive for heartburn. Negative for abdominal pain, constipation, diarrhea and vomiting.  Endocrine: Negative for polydipsia and polyuria.  Genitourinary:  Negative for dysuria, frequency, genital sores, vaginal bleeding and vaginal discharge.  Musculoskeletal:  Positive for arthralgias (right knee, hands). Negative  for gait problem and joint swelling.  Skin:  Negative for color change and rash.  Neurological:  Negative for dizziness, tremors, light-headedness and headaches.  Hematological:  Negative for adenopathy. Does not  bruise/bleed easily.  Psychiatric/Behavioral:  Negative for dysphoric mood and sleep disturbance. The patient is not nervous/anxious.     Patient Active Problem List   Diagnosis Date Noted   Low back pain 10/05/2021   History of nonmelanoma skin cancer 12/04/2020   Primary osteoarthritis of right knee 12/01/2020   Murmur 01/03/2020   Generalized anxiety disorder 06/04/2019   Status post left partial knee replacement 03/12/2016   Acquired hypothyroidism 02/07/2016   Atypical mole 11/22/2015   Gallstone 10/04/2015   Gastro-esophageal reflux disease without esophagitis 08/04/2015   Nocturnal leg cramps 11/22/2013   Obesity (BMI 30-39.9) 11/22/2013   Mixed hyperlipidemia 09/28/2013    Allergies  Allergen Reactions   Clavulanic Acid Other (See Comments)    Abdominal pain   Lisinopril Cough         Past Surgical History:  Procedure Laterality Date   APPENDECTOMY     ESOPHAGOGASTRODUODENOSCOPY (EGD) WITH PROPOFOL N/A 07/27/2020   Procedure: ESOPHAGOGASTRODUODENOSCOPY (EGD) WITH PROPOFOL;  Surgeon: Lucilla Lame, MD;  Location: [facility];  Service: Endoscopy;  Laterality: N/A;   JOINT REPLACEMENT     TONSILLECTOMY      Social History   Tobacco Use   Smoking status: Former    Types: Cigarettes    Quit date: 09/23/1998    Years since quitting: 23.6   Smokeless tobacco: Never  Vaping Use   Vaping Use: Never used  Substance Use Topics   Alcohol use: Yes    Comment: occasionally   Drug use: Never     Medication list has been reviewed and updated.  Current Meds  Medication Sig   amoxicillin (AMOXIL) 500 MG tablet TAKE 4 TABLETS BY MOUTH (2 GRAMS) 60 MINUTES BEFORE DENTAL PROCEDURE   Calcium Carbonate-Vitamin D 600-400 MG-UNIT tablet Take 1 tablet by mouth daily.   Cholecalciferol 25 MCG (1000 UT) tablet Take 1,000 Units by mouth daily.   citalopram (CELEXA) 10 MG tablet Take 1 tablet by mouth once daily   clonazePAM (KLONOPIN) 0.5 MG tablet Take 0.5 mg by mouth  as needed for anxiety.   cyclobenzaprine (FLEXERIL) 5 MG tablet Take 5 mg by mouth as needed.   ezetimibe (ZETIA) 10 MG tablet Take 1 tablet by mouth once daily   ibuprofen (ADVIL) 800 MG tablet Take 800 mg by mouth 3 (three) times daily. PRN   levothyroxine (SYNTHROID) 50 MCG tablet TAKE 1 TABLET BY MOUTH ONCE DAILY BEFORE  BREAKFAST   meloxicam (MOBIC) 15 MG tablet Take 1 tablet by mouth once daily   pantoprazole (PROTONIX) 40 MG tablet Take 1 tablet (40 mg total) by mouth daily.   potassium chloride (KLOR-CON) 10 MEQ tablet Take 10 mEq by mouth daily. Every other day for leg cramps   pravastatin (PRAVACHOL) 80 MG tablet Take 1 tablet by mouth once daily       05/06/2022    8:50 AM 01/14/2022    2:54 PM 12/24/2021    3:26 PM 04/19/2021    8:54 AM  GAD 7 : Generalized Anxiety Score  Nervous, Anxious, on Edge 0 0 0 0  Control/stop worrying 0 0 0 0  Worry too much - different things 0 0 0 0  Trouble relaxing 0 0 0 0  Restless 0 0 0 0  Easily annoyed or irritable 0 0 0  0  Afraid - awful might happen 0 0 0 0  Total GAD 7 Score 0 0 0 0  Anxiety Difficulty Not difficult at all Not difficult at all Not difficult at all        05/06/2022    8:50 AM 03/06/2022    9:52 AM 01/14/2022    2:54 PM  Depression screen PHQ 2/9  Decreased Interest 0 0 0  Down, Depressed, Hopeless 0 0 0  PHQ - 2 Score 0 0 0  Altered sleeping 0  0  Tired, decreased energy 0  0  Change in appetite 0  0  Feeling bad or failure about yourself  0  0  Trouble concentrating 0  0  Moving slowly or fidgety/restless 0  0  Suicidal thoughts 0  0  PHQ-9 Score 0  0  Difficult doing work/chores   Not difficult at all    BP Readings from Last 3 Encounters:  05/06/22 120/88  01/14/22 125/78  12/24/21 118/66    Physical Exam Vitals and nursing note reviewed.  Constitutional:      General: She is not in acute distress.    Appearance: She is well-developed.  HENT:     Head: Normocephalic and atraumatic.     Right  Ear: Tympanic membrane and ear canal normal.     Left Ear: Tympanic membrane and ear canal normal.     Nose:     Right Sinus: No maxillary sinus tenderness.     Left Sinus: No maxillary sinus tenderness.  Eyes:     General: No scleral icterus.       Right eye: No discharge.        Left eye: No discharge.     Conjunctiva/sclera: Conjunctivae normal.  Neck:     Thyroid: No thyromegaly.     Vascular: No carotid bruit.  Cardiovascular:     Rate and Rhythm: Normal rate and regular rhythm.     Pulses: Normal pulses.     Heart sounds: Normal heart sounds.  Pulmonary:     Effort: Pulmonary effort is normal. No respiratory distress.     Breath sounds: No wheezing.  Chest:  Breasts:    Right: No mass, nipple discharge, skin change or tenderness.     Left: No mass, nipple discharge, skin change or tenderness.  Abdominal:     General: Bowel sounds are normal.     Palpations: Abdomen is soft.     Tenderness: There is no abdominal tenderness.  Musculoskeletal:     Cervical back: Normal range of motion. No erythema.     Right lower leg: No edema.     Left lower leg: No edema.  Lymphadenopathy:     Cervical: No cervical adenopathy.  Skin:    General: Skin is warm and dry.     Findings: No rash.  Neurological:     Mental Status: She is alert and oriented to person, place, and time.     Cranial Nerves: No cranial nerve deficit.     Sensory: No sensory deficit.     Gait: Gait abnormal (due to right knee OA).     Deep Tendon Reflexes: Reflexes are normal and symmetric.  Psychiatric:        Attention and Perception: Attention normal.        Mood and Affect: Mood normal.     Wt Readings from Last 3 Encounters:  05/06/22 182 lb (82.6 kg)  01/14/22 182 lb 6.4 oz (82.7 kg)  12/24/21 184  lb (83.5 kg)    BP 120/88   Pulse (!) 59   Ht 5' 4"  (1.626 m)   Wt 182 lb (82.6 kg)   SpO2 94%   BMI 31.24 kg/m   Assessment and Plan: 1. Annual physical exam Normal exam Continue healthy  diet, exercise as able Immunizations are up to date  2. Encounter for screening mammogram for breast cancer Recently done at [facility] 0-4  3. Need for hepatitis C screening test - Hepatitis C antibody  4. Gastro-esophageal reflux disease without esophagitis Symptoms well controlled on daily PPI No red flag signs such as weight loss, n/v, melena Will continue pantoprazole - CBC with Differential/Platelet  5. Acquired hypothyroidism supplemented - TSH + free T4  6. Mixed hyperlipidemia Tolerating statin medication without side effects at this time LDL is at goal of < 70 on current dose Continue same therapy without change at this time. - Comprehensive metabolic panel - Lipid panel - ezetimibe (ZETIA) 10 MG tablet; Take 1 tablet (10 mg total) by mouth daily.  Dispense: 90 tablet; Refill: 3 - pravastatin (PRAVACHOL) 80 MG tablet; Take 1 tablet (80 mg total) by mouth daily.  Dispense: 90 tablet; Refill: 3  7. Generalized anxiety disorder Doing well on Celexa; no SI/HI - citalopram (CELEXA) 10 MG tablet; Take 1 tablet (10 mg total) by mouth daily.  Dispense: 90 tablet; Refill: 3  8. Primary osteoarthritis of right knee Continue PRN Mobic Supplement with Voltaren gel Monitor renal function - meloxicam (MOBIC) 15 MG tablet; Take 1 tablet (15 mg total) by mouth daily.  Dispense: 90 tablet; Refill: 1   Partially dictated using Editor, commissioning. Any errors are unintentional.  Halina Maidens, MD [facility]  05/06/2022

## 2022-05-07 LAB — CBC WITH DIFFERENTIAL/PLATELET
Basophils Absolute: 0 10*3/uL (ref 0.0–0.2)
Basos: 1 %
EOS (ABSOLUTE): 0.2 10*3/uL (ref 0.0–0.4)
Eos: 5 %
Hematocrit: 36.5 % (ref 34.0–46.6)
Hemoglobin: 12.1 g/dL (ref 11.1–15.9)
Immature Grans (Abs): 0 10*3/uL (ref 0.0–0.1)
Immature Granulocytes: 0 %
Lymphocytes Absolute: 1.6 10*3/uL (ref 0.7–3.1)
Lymphs: 36 %
MCH: 29.3 pg (ref 26.6–33.0)
MCHC: 33.2 g/dL (ref 31.5–35.7)
MCV: 88 fL (ref 79–97)
Monocytes Absolute: 0.4 10*3/uL (ref 0.1–0.9)
Monocytes: 8 %
Neutrophils Absolute: 2.2 10*3/uL (ref 1.4–7.0)
Neutrophils: 50 %
Platelets: 200 10*3/uL (ref 150–450)
RBC: 4.13 x10E6/uL (ref 3.77–5.28)
RDW: 13.3 % (ref 11.7–15.4)
WBC: 4.4 10*3/uL (ref 3.4–10.8)

## 2022-05-07 LAB — COMPREHENSIVE METABOLIC PANEL
ALT: 12 IU/L (ref 0–32)
AST: 17 IU/L (ref 0–40)
Albumin/Globulin Ratio: 1.8 (ref 1.2–2.2)
Albumin: 4.1 g/dL (ref 3.8–4.8)
Alkaline Phosphatase: 68 IU/L (ref 44–121)
BUN/Creatinine Ratio: 19 (ref 12–28)
BUN: 21 mg/dL (ref 8–27)
Bilirubin Total: 0.4 mg/dL (ref 0.0–1.2)
CO2: 21 mmol/L (ref 20–29)
Calcium: 9.3 mg/dL (ref 8.7–10.3)
Chloride: 104 mmol/L (ref 96–106)
Creatinine, Ser: 1.13 mg/dL — ABNORMAL HIGH (ref 0.57–1.00)
Globulin, Total: 2.3 g/dL (ref 1.5–4.5)
Glucose: 100 mg/dL — ABNORMAL HIGH (ref 70–99)
Potassium: 4.4 mmol/L (ref 3.5–5.2)
Sodium: 139 mmol/L (ref 134–144)
Total Protein: 6.4 g/dL (ref 6.0–8.5)
eGFR: 51 mL/min/{1.73_m2} — ABNORMAL LOW (ref >59)

## 2022-05-07 LAB — LIPID PANEL
Chol/HDL Ratio: 3.6 ratio (ref 0.0–4.4)
Cholesterol, Total: 235 mg/dL — ABNORMAL HIGH (ref 100–199)
HDL: 66 mg/dL (ref >39)
LDL Chol Calc (NIH): 146 mg/dL — ABNORMAL HIGH (ref 0–99)
Triglycerides: 133 mg/dL (ref 0–149)
VLDL Cholesterol Cal: 23 mg/dL (ref 5–40)

## 2022-05-07 LAB — TSH+FREE T4
Free T4: 1.24 ng/dL (ref 0.82–1.77)
TSH: 5.95 u[IU]/mL — ABNORMAL HIGH (ref 0.450–4.500)

## 2022-05-07 LAB — HEPATITIS C ANTIBODY: Hep C Virus Ab: NONREACTIVE

## 2022-05-08 DIAGNOSIS — M9901 Segmental and somatic dysfunction of cervical region: Secondary | ICD-10-CM | POA: Diagnosis not present

## 2022-05-08 DIAGNOSIS — M5033 Other cervical disc degeneration, cervicothoracic region: Secondary | ICD-10-CM | POA: Diagnosis not present

## 2022-05-08 DIAGNOSIS — M62830 Muscle spasm of back: Secondary | ICD-10-CM | POA: Diagnosis not present

## 2022-05-08 DIAGNOSIS — M9902 Segmental and somatic dysfunction of thoracic region: Secondary | ICD-10-CM | POA: Diagnosis not present

## 2022-05-20 ENCOUNTER — Telehealth: Payer: Self-pay

## 2022-05-20 NOTE — Telephone Encounter (Signed)
Pt lmovm reporting that she believed she had a gall bladder attack... not currently experiencing any Sx... Advised pt to call PCP as Dr Allen Norris is not in the office and if gall bladder is the issue, referral to general surgery would be needed and PCP should be able to provide that if necessary  Pt expressed understanding and states she will reach out to her PCP

## 2022-05-21 ENCOUNTER — Ambulatory Visit (INDEPENDENT_AMBULATORY_CARE_PROVIDER_SITE_OTHER): Payer: Medicare Other | Admitting: Internal Medicine

## 2022-05-21 ENCOUNTER — Encounter: Payer: Self-pay | Admitting: Internal Medicine

## 2022-05-21 VITALS — BP 108/68 | HR 77 | Ht 64.0 in | Wt 180.6 lb

## 2022-05-21 DIAGNOSIS — K8020 Calculus of gallbladder without cholecystitis without obstruction: Secondary | ICD-10-CM | POA: Diagnosis not present

## 2022-05-21 NOTE — Progress Notes (Signed)
Date:  05/21/2022   Name:  Martha Walsh   DOB:  August 12, 1951   MRN:  [id number]   Chief Complaint: Abdominal Pain (Diagnosed 2 years ago with gall stones. Had surgery scheduled in the past and had to cancel at the time. Last episode was a few nights ago- nauseous, pain under breasts and radiates into back. )  Abdominal Pain This is a recurrent problem. Episode onset: about 3 episodes this year. The onset quality is sudden. Progression since onset: the worst episode was 3 nights ago - lasted 2 hours. The pain is located in the RUQ. The pain is moderate. The quality of the pain is colicky. The abdominal pain radiates to the back. Pertinent negatives include no diarrhea, fever, headaches, vomiting or weight loss. The pain is aggravated by eating.  Gall stones seen on CT in 2017.  Planned to have surgery but then had an acute illness. After that she never rescheduled. CT Chest/abd 12/2018 at [facility]: FINDINGS:  AORTA: Normal caliber aorta. No thoracic aortic intramural hematoma.  No aortic dissection. Mild mostly calcified atherosclerotic irregularity of the thoracoabdominal aorta, including arch and abdominal branch vessels.  CHEST: Normal heart size.  No pericardial effusion. No mediastinal lymphadenopathy. Coronary atherosclerosis.  Clear central airways. No consolidation.  No pleural effusion.  ABDOMEN and PELVIS: Cholelithiasis. Subcentimeter left interpolar renal hypodensity, too small to characterize. Abdominal and pelvic organs otherwise unremarkable.  No bowel obstruction.  No free fluid.  No abdominal or pelvic lymphadenopathy.  BONES: Pectus deformity. Mild grade 1 anterolisthesis of L3 on L4. Degenerative spinal changes, most prominently at L4-5.  SOFT TISSUES: Unremarkable.    Lab Results  Component Value Date   NA 139 05/06/2022   K 4.4 05/06/2022   CO2 21 05/06/2022   GLUCOSE 100 (H) 05/06/2022   BUN 21 05/06/2022   CREATININE 1.13 (H) 05/06/2022   CALCIUM 9.3 05/06/2022   EGFR 51  (L) 05/06/2022   GFRNONAA 56 04/18/2020   Lab Results  Component Value Date   CHOL 235 (H) 05/06/2022   HDL 66 05/06/2022   LDLCALC 146 (H) 05/06/2022   TRIG 133 05/06/2022   CHOLHDL 3.6 05/06/2022   Lab Results  Component Value Date   TSH 5.950 (H) 05/06/2022   No results found for: "HGBA1C" Lab Results  Component Value Date   WBC 4.4 05/06/2022   HGB 12.1 05/06/2022   HCT 36.5 05/06/2022   MCV 88 05/06/2022   PLT 200 05/06/2022   Lab Results  Component Value Date   ALT 12 05/06/2022   AST 17 05/06/2022   ALKPHOS 68 05/06/2022   BILITOT 0.4 05/06/2022   No results found for: "25OHVITD2", "25OHVITD3", "VD25OH"   Review of Systems  Constitutional:  Positive for chills. Negative for diaphoresis, fatigue, fever and weight loss.  Respiratory:  Negative for chest tightness and shortness of breath.   Cardiovascular:  Negative for chest pain and palpitations.  Gastrointestinal:  Positive for abdominal pain. Negative for diarrhea and vomiting.  Neurological:  Negative for dizziness, light-headedness and headaches.    Patient Active Problem List   Diagnosis Date Noted   Low back pain 10/05/2021   History of nonmelanoma skin cancer 12/04/2020   Primary osteoarthritis of right knee 12/01/2020   Murmur 01/03/2020   Generalized anxiety disorder 06/04/2019   Status post left partial knee replacement 03/12/2016   Acquired hypothyroidism 02/07/2016   Atypical mole 11/22/2015   Gallstone 10/04/2015   Gastro-esophageal reflux disease without esophagitis 08/04/2015   Nocturnal  leg cramps 11/22/2013   Obesity (BMI 30-39.9) 11/22/2013   Mixed hyperlipidemia 09/28/2013    Allergies  Allergen Reactions   Clavulanic Acid Other (See Comments)    Abdominal pain   Lisinopril Cough         Past Surgical History:  Procedure Laterality Date   APPENDECTOMY     ESOPHAGOGASTRODUODENOSCOPY (EGD) WITH PROPOFOL N/A 07/27/2020   Procedure: ESOPHAGOGASTRODUODENOSCOPY (EGD) WITH  PROPOFOL;  Surgeon: Lucilla Lame, MD;  Location: [place];  Service: Endoscopy;  Laterality: N/A;   JOINT REPLACEMENT     TONSILLECTOMY      Social History   Tobacco Use   Smoking status: Former    Types: Cigarettes    Quit date: 09/23/1998    Years since quitting: 23.6   Smokeless tobacco: Never  Vaping Use   Vaping Use: Never used  Substance Use Topics   Alcohol use: Yes    Comment: occasionally   Drug use: Never     Medication list has been reviewed and updated.  Current Meds  Medication Sig   amoxicillin (AMOXIL) 500 MG tablet TAKE 4 TABLETS BY MOUTH (2 GRAMS) 60 MINUTES BEFORE DENTAL PROCEDURE   Calcium Carbonate-Vitamin D 600-400 MG-UNIT tablet Take 1 tablet by mouth daily.   Cholecalciferol 25 MCG (1000 UT) tablet Take 1,000 Units by mouth daily.   citalopram (CELEXA) 10 MG tablet Take 1 tablet (10 mg total) by mouth daily.   clonazePAM (KLONOPIN) 0.5 MG tablet Take 0.5 mg by mouth as needed for anxiety.   cyclobenzaprine (FLEXERIL) 5 MG tablet Take 5 mg by mouth as needed.   ezetimibe (ZETIA) 10 MG tablet Take 1 tablet (10 mg total) by mouth daily.   ibuprofen (ADVIL) 800 MG tablet Take 800 mg by mouth 3 (three) times daily. PRN   levothyroxine (SYNTHROID) 50 MCG tablet TAKE 1 TABLET BY MOUTH ONCE DAILY BEFORE  BREAKFAST   meloxicam (MOBIC) 15 MG tablet Take 1 tablet (15 mg total) by mouth daily.   pantoprazole (PROTONIX) 40 MG tablet Take 1 tablet (40 mg total) by mouth daily.   potassium chloride (KLOR-CON) 10 MEQ tablet Take 10 mEq by mouth daily. Every other day for leg cramps   pravastatin (PRAVACHOL) 80 MG tablet Take 1 tablet (80 mg total) by mouth daily.       05/21/2022    2:11 PM 05/06/2022    8:50 AM 01/14/2022    2:54 PM 12/24/2021    3:26 PM  GAD 7 : Generalized Anxiety Score  Nervous, Anxious, on Edge 0 0 0 0  Control/stop worrying 0 0 0 0  Worry too much - different things 0 0 0 0  Trouble relaxing 0 0 0 0  Restless 0 0 0 0  Easily  annoyed or irritable 0 0 0 0  Afraid - awful might happen 0 0 0 0  Total GAD 7 Score 0 0 0 0  Anxiety Difficulty Not difficult at all Not difficult at all Not difficult at all Not difficult at all       05/21/2022    2:11 PM 05/06/2022    8:50 AM 03/06/2022    9:52 AM  Depression screen PHQ 2/9  Decreased Interest 0 0 0  Down, Depressed, Hopeless 0 0 0  PHQ - 2 Score 0 0 0  Altered sleeping 0 0   Tired, decreased energy 0 0   Change in appetite 0 0   Feeling bad or failure about yourself  0 0  Trouble concentrating 0 0   Moving slowly or fidgety/restless 0 0   Suicidal thoughts 0 0   PHQ-9 Score 0 0   Difficult doing work/chores Not difficult at all      BP Readings from Last 3 Encounters:  05/21/22 108/68  05/06/22 120/88  01/14/22 125/78    Physical Exam Vitals and nursing note reviewed.  Constitutional:      General: She is not in acute distress.    Appearance: She is well-developed.  HENT:     Head: Normocephalic and atraumatic.  Pulmonary:     Effort: Pulmonary effort is normal. No respiratory distress.  Abdominal:     General: Abdomen is flat. Bowel sounds are normal.     Palpations: Abdomen is soft.     Tenderness: There is no abdominal tenderness. There is no right CVA tenderness, left CVA tenderness, guarding or rebound. Negative signs include Murphy's sign.  Skin:    General: Skin is warm and dry.     Findings: No rash.  Neurological:     Mental Status: She is alert and oriented to person, place, and time.  Psychiatric:        Mood and Affect: Mood normal.        Behavior: Behavior normal.     Wt Readings from Last 3 Encounters:  05/21/22 180 lb 9.6 oz (81.9 kg)  05/06/22 182 lb (82.6 kg)  01/14/22 182 lb 6.4 oz (82.7 kg)    BP 108/68   Pulse 77   Ht 5' 4"  (1.626 m)   Wt 180 lb 9.6 oz (81.9 kg)   SpO2 96%   BMI 31.00 kg/m   Assessment and Plan: 1. Calculus of gallbladder without cholecystitis without obstruction Intermittent mild post  prandial pain - patient wants to pursue elective surgery if appropriate after her more severe episode this weekend. Counseled on low fat, low protein diet and when the ED is appropriate. - CBC with Differential/Platelet - Comprehensive metabolic panel - US Abdomen Limited RUQ (LIVER/GB) - Ambulatory referral to General Surgery   Partially dictated using Dragon software. Any errors are unintentional.  Halina Maidens, MD [facility]  05/21/2022

## 2022-05-22 ENCOUNTER — Ambulatory Visit
Admission: RE | Admit: 2022-05-22 | Discharge: 2022-05-22 | Disposition: A | Discharge status: Home / Self Care | Payer: Medicare Other | Source: Ambulatory Visit | Attending: Internal Medicine | Admitting: Internal Medicine

## 2022-05-22 DIAGNOSIS — K8020 Calculus of gallbladder without cholecystitis without obstruction: Secondary | ICD-10-CM | POA: Insufficient documentation

## 2022-05-22 DIAGNOSIS — R1011 Right upper quadrant pain: Secondary | ICD-10-CM | POA: Diagnosis not present

## 2022-05-22 LAB — COMPREHENSIVE METABOLIC PANEL
ALT: 13 IU/L (ref 0–32)
AST: 18 IU/L (ref 0–40)
Albumin/Globulin Ratio: 1.9 (ref 1.2–2.2)
Albumin: 4.4 g/dL (ref 3.8–4.8)
Alkaline Phosphatase: 69 IU/L (ref 44–121)
BUN/Creatinine Ratio: 16 (ref 12–28)
BUN: 18 mg/dL (ref 8–27)
Bilirubin Total: 0.4 mg/dL (ref 0.0–1.2)
CO2: 22 mmol/L (ref 20–29)
Calcium: 9.4 mg/dL (ref 8.7–10.3)
Chloride: 102 mmol/L (ref 96–106)
Creatinine, Ser: 1.11 mg/dL — ABNORMAL HIGH (ref 0.57–1.00)
Globulin, Total: 2.3 g/dL (ref 1.5–4.5)
Glucose: 86 mg/dL (ref 70–99)
Potassium: 4.5 mmol/L (ref 3.5–5.2)
Sodium: 140 mmol/L (ref 134–144)
Total Protein: 6.7 g/dL (ref 6.0–8.5)
eGFR: 52 mL/min/{1.73_m2} — ABNORMAL LOW (ref >59)

## 2022-05-22 LAB — CBC WITH DIFFERENTIAL/PLATELET
Basophils Absolute: 0.1 10*3/uL (ref 0.0–0.2)
Basos: 1 %
EOS (ABSOLUTE): 0.2 10*3/uL (ref 0.0–0.4)
Eos: 3 %
Hematocrit: 36.3 % (ref 34.0–46.6)
Hemoglobin: 12.1 g/dL (ref 11.1–15.9)
Immature Grans (Abs): 0 10*3/uL (ref 0.0–0.1)
Immature Granulocytes: 0 %
Lymphocytes Absolute: 2.2 10*3/uL (ref 0.7–3.1)
Lymphs: 38 %
MCH: 29 pg (ref 26.6–33.0)
MCHC: 33.3 g/dL (ref 31.5–35.7)
MCV: 87 fL (ref 79–97)
Monocytes Absolute: 0.5 10*3/uL (ref 0.1–0.9)
Monocytes: 9 %
Neutrophils Absolute: 2.9 10*3/uL (ref 1.4–7.0)
Neutrophils: 49 %
Platelets: 212 10*3/uL (ref 150–450)
RBC: 4.17 x10E6/uL (ref 3.77–5.28)
RDW: 13.3 % (ref 11.7–15.4)
WBC: 5.7 10*3/uL (ref 3.4–10.8)

## 2022-05-30 DIAGNOSIS — M1711 Unilateral primary osteoarthritis, right knee: Secondary | ICD-10-CM | POA: Diagnosis not present

## 2022-05-30 DIAGNOSIS — M13842 Other specified arthritis, left hand: Secondary | ICD-10-CM | POA: Diagnosis not present

## 2022-06-10 ENCOUNTER — Ambulatory Visit: Payer: Medicare Other | Admitting: Surgery

## 2022-06-10 ENCOUNTER — Encounter: Payer: Self-pay | Admitting: Surgery

## 2022-06-10 VITALS — BP 126/68 | HR 68 | Temp 98.0°F | Ht 64.0 in | Wt 182.0 lb

## 2022-06-10 DIAGNOSIS — K8020 Calculus of gallbladder without cholecystitis without obstruction: Secondary | ICD-10-CM | POA: Diagnosis not present

## 2022-06-10 NOTE — Progress Notes (Unsigned)
06/10/2022  Reason for Visit: Symptomatic cholelithiasis  Requesting Provider: Halina Maidens, MD  History of Present Illness: Martha Walsh is a 71 y.o. female presenting for evaluation of cholelithiasis.  The patient has a known history of gallstones and has very intermittent episodes of RUQ abdominal pain, which radiates to the back.  Denies having any nausea in her episodes except the last one, which was a bit more severe.  She had an ultrasound of right upper quadrant on 05/22/22 which showed cholelithiasis with multiple small gallstones, but no wall thickening or pericholecystic fluid.  Since that episode, she's been doing well and currently is pain free.  Denies any fevers, chills, chest pain, shortness of breath.  Past Medical History: Past Medical History:  Diagnosis Date   GERD (gastroesophageal reflux disease)    Hyperlipidemia    Hypothyroid      Past Surgical History: Past Surgical History:  Procedure Laterality Date   APPENDECTOMY     ESOPHAGOGASTRODUODENOSCOPY (EGD) WITH PROPOFOL N/A 07/27/2020   Procedure: ESOPHAGOGASTRODUODENOSCOPY (EGD) WITH PROPOFOL;  Surgeon: Lucilla Lame, MD;  Location: [place];  Service: Endoscopy;  Laterality: N/A;   JOINT REPLACEMENT Left    partial knee   TONSILLECTOMY      Home Medications: Prior to Admission medications   Medication Sig Start Date End Date Taking? Authorizing Provider  amoxicillin (AMOXIL) 500 MG tablet TAKE 4 TABLETS BY MOUTH (2 GRAMS) 60 MINUTES BEFORE DENTAL PROCEDURE   Yes [provider]  Calcium Carbonate-Vitamin D 600-400 MG-UNIT tablet Take 1 tablet by mouth daily. 09/29/12  Yes [provider]  Cholecalciferol 25 MCG (1000 UT) tablet Take 1,000 Units by mouth daily.   Yes [provider]  citalopram (CELEXA) 10 MG tablet Take 1 tablet (10 mg total) by mouth daily. 05/06/22  Yes Glean Hess, MD  clonazePAM (KLONOPIN) 0.5 MG tablet Take 0.5 mg by mouth as needed for anxiety.  03/03/19  Yes [provider]  cyclobenzaprine (FLEXERIL) 5 MG tablet Take 5 mg by mouth as needed. 11/03/20  Yes [provider]  ezetimibe (ZETIA) 10 MG tablet Take 1 tablet (10 mg total) by mouth daily. 05/06/22  Yes Glean Hess, MD  ibuprofen (ADVIL) 800 MG tablet Take 800 mg by mouth 3 (three) times daily. PRN 01/18/22  Yes [provider]  levothyroxine (SYNTHROID) 50 MCG tablet TAKE 1 TABLET BY MOUTH ONCE DAILY BEFORE  BREAKFAST 08/06/21  Yes Glean Hess, MD  pantoprazole (PROTONIX) 40 MG tablet Take 1 tablet (40 mg total) by mouth daily. 12/24/21  Yes Lucilla Lame, MD  potassium chloride (KLOR-CON) 10 MEQ tablet Take 10 mEq by mouth daily. Every other day for leg cramps   Yes [provider]  pravastatin (PRAVACHOL) 80 MG tablet Take 1 tablet (80 mg total) by mouth daily. 05/06/22  Yes Glean Hess, MD    Allergies: Allergies  Allergen Reactions   Clavulanic Acid Other (See Comments)    Abdominal pain   Lisinopril Cough         Social History:  reports that she quit smoking about 23 years ago. Her smoking use included cigarettes. She has been exposed to tobacco smoke. She has never used smokeless tobacco. She reports current alcohol use. She reports that she does not use drugs.   Family History: Family History  Problem Relation Age of Onset   Heart disease Father     Review of Systems: Review of Systems  Constitutional:  Negative for chills and fever.  Respiratory:  Negative for shortness of breath.   Cardiovascular:  Negative for chest pain.  Gastrointestinal:  Positive for abdominal pain and nausea. Negative for vomiting.  Genitourinary:  Negative for dysuria.  Skin:  Negative for rash.    Physical Exam BP 126/68   Pulse 68   Temp 98 F (36.7 C)   Ht '5\' 4"'$  (1.626 m)   Wt 182 lb (82.6 kg)   SpO2 95%   BMI 31.24 kg/m  CONSTITUTIONAL: No acute distress, well-nourished HEENT:  Normocephalic, atraumatic, extraocular  motion intact. RESPIRATORY:  Lungs are clear, and breath sounds are equal bilaterally. Normal respiratory effort without pathologic use of accessory muscles. CARDIOVASCULAR: Heart is regular without murmurs, gallops, or rubs. GI: The abdomen is soft, nondistended, currently nontender to palpation.  Negative Murphy's sign.  MUSCULOSKELETAL:  Normal muscle strength and tone in all four extremities.  No peripheral edema or cyanosis. NEUROLOGIC:  Motor and sensation is grossly normal.  Cranial nerves are grossly intact. PSYCH:  Alert and oriented to person, place and time. Affect is normal.  Laboratory Analysis: Labs from 05/21/2022: Sodium 140, potassium 4.5, chloride 102, CO2 86, BUN 18, creatinine 1.11.  Total bilirubin 0.4, AST 18, ALT 13, alkaline phosphatase 69, albumin 4.4.  WBC 5.7, hemoglobin 12.1, hematocrit 36.3, platelets 212.  Imaging: Ultrasound RUQ on 05/22/2022: IMPRESSION: 1. Cholelithiasis without evidence of acute cholecystitis or biliary dilatation. 2. Unremarkable liver.  Assessment and Plan: This is a 71 y.o. female with symptomatic cholelithiasis  --Discussed with the patient the findings on her ultrasound and how the gallstones may lead temporary obstruction of the cystic duct, which would lead to a biliary colic episode.  As the stones move out of the way, the pain resolves.  However, also discussed about the risk of cholecystitis when a stone becomes lodged and continues to obstruct the cystic duct.  Discussed with her that although her episodes are infrequent, she would be at risk for cholecystitis.   --The patient reports that she is also having really severe right knee pain.  She has an appointment with Ortho on 06/17/22 to be evaluated for knee replacement surgery.  She also has a trip to [place] next month and would like to postpone any surgery for until after her trip.  She feels her knee is the bigger priority as her gallbladder episodes are infrequent.  I think that's  reasonable and we do have flexibility at this point. --Patient will call us when she's ready to schedule surgery again in the future.  Return precautions given.  I spent 30 minutes dedicated to the care of this patient on the date of this encounter to include pre-visit review of records, face-to-face time with the patient discussing diagnosis and management, and any post-visit coordination of care.   Melvyn Neth, [facility]

## 2022-06-10 NOTE — Patient Instructions (Signed)
We have spoken today about having your gallbladder removed. Please see the information below about this surgery.  Call us once you are ready to schedule your surgery and we will set up a follow up visit with Dr Hampton Abbot.    You will most likely be out of work 1-2 weeks for this surgery.  If you have FMLA or disability paperwork that needs filled out you may drop this off at our office or this can be faxed to [phone].  You will return after your post-op appointment with a lifting restriction for approximately 4 more weeks.  You will be able to eat anything you would like to following surgery. But, start by eating a bland diet and advance this as tolerated. The Gallbladder diet is below, please go as closely by this diet as possible prior to surgery to avoid any further attacks.  Please see the (blue)pre-care form that you have been given today. Our surgery scheduler will call you to verify surgery date and to go over information.   If you have any questions, please call our office.  Laparoscopic Cholecystectomy Laparoscopic cholecystectomy is surgery to remove the gallbladder. The gallbladder is located in the upper right part of the abdomen, behind the liver. It is a storage sac for bile, which is produced in the liver. Bile aids in the digestion and absorption of fats. Cholecystectomy is often done for inflammation of the gallbladder (cholecystitis). This condition is usually caused by a buildup of gallstones (cholelithiasis) in the gallbladder. Gallstones can block the flow of bile, and that can result in inflammation and pain. In severe cases, emergency surgery may be required. If emergency surgery is not required, you will have time to prepare for the procedure. Laparoscopic surgery is an alternative to open surgery. Laparoscopic surgery has a shorter recovery time. Your common bile duct may also need to be examined during the procedure. If stones are found in the common bile duct, they  may be removed. LET [facility] CARE PROVIDER KNOW ABOUT: Any allergies you have. All medicines you are taking, including vitamins, herbs, eye drops, creams, and over-the-counter medicines. Previous problems you or members of your family have had with the use of anesthetics. Any blood disorders you have. Previous surgeries you have had.  Any medical conditions you have. RISKS AND COMPLICATIONS Generally, this is a safe procedure. However, problems may occur, including: Infection. Bleeding. Allergic reactions to medicines. Damage to other structures or organs. A stone remaining in the common bile duct. A bile leak from the cyst duct that is clipped when your gallbladder is removed. The need to convert to open surgery, which requires a larger incision in the abdomen. This may be necessary if your surgeon thinks that it is not safe to continue with a laparoscopic procedure. BEFORE THE PROCEDURE Ask your health care provider about: Changing or stopping your regular medicines. This is especially important if you are taking diabetes medicines or blood thinners. Taking medicines such as aspirin and ibuprofen. These medicines can thin your blood. Do not take these medicines before your procedure if your health care provider instructs you not to. Follow instructions from your health care provider about eating or drinking restrictions. Let your health care provider know if you develop a cold or an infection before surgery. Plan to have someone take you home after the procedure. Ask your health care provider how your surgical site will be marked or identified. You may be given antibiotic medicine to help prevent infection. PROCEDURE To  reduce your risk of infection: Your health care team will wash or sanitize their hands. Your skin will be washed with soap. An IV tube may be inserted into one of your veins. You will be given a medicine to make you fall asleep (general anesthetic). A breathing  tube will be placed in your mouth. The surgeon will make several small cuts (incisions) in your abdomen. A thin, lighted tube (laparoscope) that has a tiny camera on the end will be inserted through one of the small incisions. The camera on the laparoscope will send a picture to a TV screen (monitor) in the operating room. This will give the surgeon a good view inside your abdomen. A gas will be pumped into your abdomen. This will expand your abdomen to give the surgeon more room to perform the surgery. Other tools that are needed for the procedure will be inserted through the other incisions. The gallbladder will be removed through one of the incisions. After your gallbladder has been removed, the incisions will be closed with stitches (sutures), staples, or skin glue. Your incisions may be covered with a bandage (dressing). The procedure may vary among health care providers and hospitals. AFTER THE PROCEDURE Your blood pressure, heart rate, breathing rate, and blood oxygen level will be monitored often until the medicines you were given have worn off. You will be given medicines as needed to control your pain.   This information is not intended to replace advice given to you by your health care provider. Make sure you discuss any questions you have with your health care provider.   Document Released: 09/09/2005 Document Revised: 05/31/2015 Document Reviewed: 04/21/2013 Elsevier Interactive Patient Education 2016 [place] Diet for Gallbladder Conditions A low-fat diet can be helpful if you have pancreatitis or a gallbladder condition. With these conditions, your pancreas and gallbladder have trouble digesting fats. A healthy eating plan with less fat will help rest your pancreas and gallbladder and reduce your symptoms. WHAT DO I NEED TO KNOW ABOUT THIS DIET? Eat a low-fat diet. Reduce your fat intake to less than 20-30% of your total daily calories. This is less than 50-60 g  of fat per day. Remember that you need some fat in your diet. Ask your dietician what your daily goal should be. Choose nonfat and low-fat healthy foods. Look for the words "nonfat," "low fat," or "fat free." As a guide, look on the label and choose foods with less than 3 g of fat per serving. Eat only one serving. Avoid alcohol. Do not smoke. If you need help quitting, talk with your health care provider. Eat small frequent meals instead of three large heavy meals. WHAT FOODS CAN I EAT? Grains Include healthy grains and starches such as potatoes, wheat bread, fiber-rich cereal, and brown rice. Choose whole grain options whenever possible. In adults, whole grains should account for 45-65% of your daily calories.  Fruits and Vegetables Eat plenty of fruits and vegetables. Fresh fruits and vegetables add fiber to your diet. Meats and Other Protein Sources Eat lean meat such as chicken and pork. Trim any fat off of meat before cooking it. Eggs, fish, and beans are other sources of protein. In adults, these foods should account for 10-35% of your daily calories. Dairy Choose low-fat milk and dairy options. Dairy includes fat and protein, as well as calcium.  Fats and Oils Limit high-fat foods such as fried foods, sweets, baked goods, sugary drinks.  Other Creamy sauces and condiments, such  as mayonnaise, can add extra fat. Think about whether or not you need to use them, or use smaller amounts or low fat options. WHAT FOODS ARE NOT RECOMMENDED? High fat foods, such as: [employer]. Ice cream. [place] toast. Sweet rolls. Pizza. Cheese bread. Foods covered with batter, butter, creamy sauces, or cheese. Fried foods. Sugary drinks and desserts. Foods that cause gas or bloating   This information is not intended to replace advice given to you by your health care provider. Make sure you discuss any questions you have with your health care provider.   Document Released: 09/14/2013 Document  Reviewed: 09/14/2013 Elsevier Interactive Patient Education [employer].

## 2022-06-12 DIAGNOSIS — M5033 Other cervical disc degeneration, cervicothoracic region: Secondary | ICD-10-CM | POA: Diagnosis not present

## 2022-06-12 DIAGNOSIS — M9902 Segmental and somatic dysfunction of thoracic region: Secondary | ICD-10-CM | POA: Diagnosis not present

## 2022-06-12 DIAGNOSIS — M62830 Muscle spasm of back: Secondary | ICD-10-CM | POA: Diagnosis not present

## 2022-06-12 DIAGNOSIS — M9901 Segmental and somatic dysfunction of cervical region: Secondary | ICD-10-CM | POA: Diagnosis not present

## 2022-06-17 DIAGNOSIS — M25561 Pain in right knee: Secondary | ICD-10-CM | POA: Diagnosis not present

## 2022-06-17 DIAGNOSIS — M1711 Unilateral primary osteoarthritis, right knee: Secondary | ICD-10-CM | POA: Diagnosis not present

## 2022-06-27 ENCOUNTER — Ambulatory Visit: Payer: Medicare Other | Admitting: Gastroenterology

## 2022-07-10 DIAGNOSIS — M9901 Segmental and somatic dysfunction of cervical region: Secondary | ICD-10-CM | POA: Diagnosis not present

## 2022-07-10 DIAGNOSIS — M62830 Muscle spasm of back: Secondary | ICD-10-CM | POA: Diagnosis not present

## 2022-07-10 DIAGNOSIS — M9902 Segmental and somatic dysfunction of thoracic region: Secondary | ICD-10-CM | POA: Diagnosis not present

## 2022-07-10 DIAGNOSIS — M5033 Other cervical disc degeneration, cervicothoracic region: Secondary | ICD-10-CM | POA: Diagnosis not present

## 2022-07-29 DIAGNOSIS — M1711 Unilateral primary osteoarthritis, right knee: Secondary | ICD-10-CM | POA: Diagnosis not present

## 2022-08-07 DIAGNOSIS — M62830 Muscle spasm of back: Secondary | ICD-10-CM | POA: Diagnosis not present

## 2022-08-07 DIAGNOSIS — M9901 Segmental and somatic dysfunction of cervical region: Secondary | ICD-10-CM | POA: Diagnosis not present

## 2022-08-07 DIAGNOSIS — M9902 Segmental and somatic dysfunction of thoracic region: Secondary | ICD-10-CM | POA: Diagnosis not present

## 2022-08-07 DIAGNOSIS — M5033 Other cervical disc degeneration, cervicothoracic region: Secondary | ICD-10-CM | POA: Diagnosis not present

## 2022-08-28 ENCOUNTER — Other Ambulatory Visit: Payer: Self-pay

## 2022-08-28 ENCOUNTER — Other Ambulatory Visit: Payer: Self-pay | Admitting: Internal Medicine

## 2022-08-28 NOTE — Telephone Encounter (Signed)
Requested medication (s) are due for refill today: yes  Requested medication (s) are on the active medication list: yes  Last refill:  08/06/21 #90 3 RF  Future visit scheduled: yes  Notes to clinic:  last TSH out of range   Requested Prescriptions  Pending Prescriptions Disp Refills   levothyroxine (SYNTHROID) 50 MCG tablet [Pharmacy Med Name: Levothyroxine Sodium 50 MCG Oral Tablet] 90 tablet 0    Sig: TAKE 1 TABLET BY MOUTH ONCE DAILY BEFORE  BREAKFAST     Endocrinology:  Hypothyroid Agents Failed - 08/28/2022  9:30 AM      Failed - TSH in normal range and within 360 days    TSH  Date Value Ref Range Status  05/06/2022 5.950 (H) 0.450 - 4.500 uIU/mL Final         Passed - Valid encounter within last 12 months    Recent Outpatient Visits           3 months ago Calculus of gallbladder without cholecystitis without obstruction   [place] [facility] at [facility], Jesse Sans, MD   3 months ago Annual physical exam   [place] [facility] at [facility], Jesse Sans, MD   7 months ago UTI symptoms   [place] [facility] at [facility], Jesse Sans, MD   8 months ago UTI symptoms   [facility] at [facility], Jesse Sans, MD   1 year ago Annual physical exam   [facility] at [facility], Jesse Sans, MD       Future Appointments             In 2 months [facility] Melia, Jesse Sans, MD [place] [facility] at [facility], [facility]

## 2022-08-30 ENCOUNTER — Ambulatory Visit: Payer: Medicare Other | Admitting: Surgery

## 2022-08-30 DIAGNOSIS — H2513 Age-related nuclear cataract, bilateral: Secondary | ICD-10-CM | POA: Diagnosis not present

## 2022-09-02 ENCOUNTER — Ambulatory Visit: Payer: Medicare Other | Admitting: Surgery

## 2022-09-04 DIAGNOSIS — M9901 Segmental and somatic dysfunction of cervical region: Secondary | ICD-10-CM | POA: Diagnosis not present

## 2022-09-04 DIAGNOSIS — M9902 Segmental and somatic dysfunction of thoracic region: Secondary | ICD-10-CM | POA: Diagnosis not present

## 2022-09-04 DIAGNOSIS — M5033 Other cervical disc degeneration, cervicothoracic region: Secondary | ICD-10-CM | POA: Diagnosis not present

## 2022-09-04 DIAGNOSIS — M62830 Muscle spasm of back: Secondary | ICD-10-CM | POA: Diagnosis not present

## 2022-09-09 ENCOUNTER — Telehealth: Payer: Self-pay

## 2022-09-09 ENCOUNTER — Ambulatory Visit: Payer: Medicare Other | Admitting: Surgery

## 2022-09-09 ENCOUNTER — Encounter: Payer: Self-pay | Admitting: Surgery

## 2022-09-09 ENCOUNTER — Telehealth: Payer: Self-pay | Admitting: Surgery

## 2022-09-09 VITALS — BP 137/76 | HR 71 | Temp 98.1°F | Ht 64.0 in | Wt 186.2 lb

## 2022-09-09 DIAGNOSIS — K8020 Calculus of gallbladder without cholecystitis without obstruction: Secondary | ICD-10-CM | POA: Diagnosis not present

## 2022-09-09 NOTE — Progress Notes (Signed)
09/09/2022  History of Present Illness: Martha Walsh is a 71 y.o. female presenting for follow up of symptomatic cholelithiasis.  She was last seen on 06/10/22.  At that point she wanted to wait before surgery as she was also having right knee pain issues.  She had another episode of biliary colic last week which lasted about 7 hrs and now she would like to proceed with surgery before another episode happens.  Her episode was similar as before, with pain in RUQ and epigastric area, radiating to back, with nausea.  Currently asymptomatic.  Past Medical History: Past Medical History:  Diagnosis Date   GERD (gastroesophageal reflux disease)    Hyperlipidemia    Hypothyroid      Past Surgical History: Past Surgical History:  Procedure Laterality Date   APPENDECTOMY     ESOPHAGOGASTRODUODENOSCOPY (EGD) WITH PROPOFOL N/A 07/27/2020   Procedure: ESOPHAGOGASTRODUODENOSCOPY (EGD) WITH PROPOFOL;  Surgeon: Lucilla Lame, MD;  Location: [facility];  Service: Endoscopy;  Laterality: N/A;   JOINT REPLACEMENT Left    partial knee   TONSILLECTOMY      Home Medications: Prior to Admission medications   Medication Sig Start Date End Date Taking? Authorizing Provider  amoxicillin (AMOXIL) 500 MG tablet TAKE 4 TABLETS BY MOUTH (2 GRAMS) 60 MINUTES BEFORE DENTAL PROCEDURE   Yes [provider]  Calcium Carbonate-Vitamin D 600-400 MG-UNIT tablet Take 1 tablet by mouth daily. 09/29/12  Yes [provider]  Cholecalciferol 25 MCG (1000 UT) tablet Take 1,000 Units by mouth daily.   Yes [provider]  citalopram (CELEXA) 10 MG tablet Take 1 tablet (10 mg total) by mouth daily. 05/06/22  Yes Glean Hess, MD  clonazePAM (KLONOPIN) 0.5 MG tablet Take 0.5 mg by mouth as needed for anxiety. 03/03/19  Yes [provider]  cyclobenzaprine (FLEXERIL) 5 MG tablet Take 5 mg by mouth as needed. 11/03/20  Yes [provider]  ezetimibe (ZETIA) 10 MG tablet Take 1  tablet (10 mg total) by mouth daily. 05/06/22  Yes Glean Hess, MD  ibuprofen (ADVIL) 800 MG tablet Take 800 mg by mouth 3 (three) times daily. PRN 01/18/22  Yes [provider]  levothyroxine (SYNTHROID) 50 MCG tablet TAKE 1 TABLET BY MOUTH ONCE DAILY BEFORE BREAKFAST 08/28/22  Yes Glean Hess, MD  pantoprazole (PROTONIX) 40 MG tablet Take 1 tablet (40 mg total) by mouth daily. 12/24/21  Yes Lucilla Lame, MD  potassium chloride (KLOR-CON) 10 MEQ tablet Take 10 mEq by mouth daily. Every other day for leg cramps   Yes [provider]  pravastatin (PRAVACHOL) 80 MG tablet Take 1 tablet (80 mg total) by mouth daily. 05/06/22  Yes Glean Hess, MD    Allergies: Allergies  Allergen Reactions   Clavulanic Acid Other (See Comments)    Abdominal pain   Lisinopril Cough         Review of Systems: Review of Systems  Constitutional:  Negative for chills and fever.  HENT:  Negative for hearing loss.   Respiratory:  Negative for shortness of breath.   Cardiovascular:  Negative for chest pain.  Gastrointestinal:  Positive for nausea. Negative for abdominal pain.  Genitourinary:  Negative for dysuria.  Musculoskeletal:  Positive for joint pain. Negative for myalgias.  Skin:  Negative for rash.  Neurological:  Negative for dizziness.  Psychiatric/Behavioral:  Negative for depression.     Physical Exam BP 137/76   Pulse 71   Temp 98.1 F (36.7 C) (Oral)  Ht '5\' 4"'$  (1.626 m)   Wt 186 lb 3.2 oz (84.5 kg)   SpO2 94%   BMI 31.96 kg/m  CONSTITUTIONAL: No acute distress, well nourished. HEENT:  Normocephalic, atraumatic, extraocular motion intact. NECK:  Trachea is midline, no jugular venous distention RESPIRATORY:  Normal respiratory effort without pathologic use of accessory muscles. CARDIOVASCULAR: Regular rhythm and rate GI: The abdomen is soft, non-distended, currently non-tender to palpation.  Negative Murphy's sign. MUSCULOSKELETAL:  Normal gait, no  peripheral edema. NEUROLOGIC:  Motor and sensation is grossly normal.  Cranial nerves are grossly intact. PSYCH:  Alert and oriented to person, place and time. Affect is normal.  Labs/Imaging: Ultrasound RUQ on 05/22/22: IMPRESSION: 1. Cholelithiasis without evidence of acute cholecystitis or biliary dilatation. 2. Unremarkable liver.  Assessment and Plan: This is a 71 y.o. female with symptomatic cholelithiasis.  --The patient had another episode of biliary colic last week and she would like to schedule surgery so this does not keep recurring.  Discussed the plan for a robotic assisted cholecystectomy.  Reviewed the surgery at length including the incisions, risks of bleeding, infection, injury to surrounding structures, that this is an outpatient surgery, post-op activity restrictions, pain control, and she's willing to proceed. --Will schedule her for surgery on 09/26/22.  Will send also for medical clearance. --All of her questions have been answered.  I spent 40 minutes dedicated to the care of this patient on the date of this encounter to include pre-visit review of records, face-to-face time with the patient discussing diagnosis and management, and any post-visit coordination of care.   Melvyn Neth, [facility]

## 2022-09-09 NOTE — Telephone Encounter (Signed)
Faxed medical clearance to Dr. Halina Maidens at [phone].

## 2022-09-09 NOTE — H&P (View-Only) (Signed)
09/09/2022  History of Present Illness: Martha Walsh is a 71 y.o. female presenting for follow up of symptomatic cholelithiasis.  She was last seen on 06/10/22.  At that point she wanted to wait before surgery as she was also having right knee pain issues.  She had another episode of biliary colic last week which lasted about 7 hrs and now she would like to proceed with surgery before another episode happens.  Her episode was similar as before, with pain in RUQ and epigastric area, radiating to back, with nausea.  Currently asymptomatic.  Past Medical History: Past Medical History:  Diagnosis Date   GERD (gastroesophageal reflux disease)    Hyperlipidemia    Hypothyroid      Past Surgical History: Past Surgical History:  Procedure Laterality Date   APPENDECTOMY     ESOPHAGOGASTRODUODENOSCOPY (EGD) WITH PROPOFOL N/A 07/27/2020   Procedure: ESOPHAGOGASTRODUODENOSCOPY (EGD) WITH PROPOFOL;  Surgeon: Lucilla Lame, MD;  Location: [place];  Service: Endoscopy;  Laterality: N/A;   JOINT REPLACEMENT Left    partial knee   TONSILLECTOMY      Home Medications: Prior to Admission medications   Medication Sig Start Date End Date Taking? Authorizing Provider  amoxicillin (AMOXIL) 500 MG tablet TAKE 4 TABLETS BY MOUTH (2 GRAMS) 60 MINUTES BEFORE DENTAL PROCEDURE   Yes [provider]  Calcium Carbonate-Vitamin D 600-400 MG-UNIT tablet Take 1 tablet by mouth daily. 09/29/12  Yes [provider]  Cholecalciferol 25 MCG (1000 UT) tablet Take 1,000 Units by mouth daily.   Yes [provider]  citalopram (CELEXA) 10 MG tablet Take 1 tablet (10 mg total) by mouth daily. 05/06/22  Yes Glean Hess, MD  clonazePAM (KLONOPIN) 0.5 MG tablet Take 0.5 mg by mouth as needed for anxiety. 03/03/19  Yes [provider]  cyclobenzaprine (FLEXERIL) 5 MG tablet Take 5 mg by mouth as needed. 11/03/20  Yes [provider]  ezetimibe (ZETIA) 10 MG tablet Take 1  tablet (10 mg total) by mouth daily. 05/06/22  Yes Glean Hess, MD  ibuprofen (ADVIL) 800 MG tablet Take 800 mg by mouth 3 (three) times daily. PRN 01/18/22  Yes [provider]  levothyroxine (SYNTHROID) 50 MCG tablet TAKE 1 TABLET BY MOUTH ONCE DAILY BEFORE BREAKFAST 08/28/22  Yes Glean Hess, MD  pantoprazole (PROTONIX) 40 MG tablet Take 1 tablet (40 mg total) by mouth daily. 12/24/21  Yes Lucilla Lame, MD  potassium chloride (KLOR-CON) 10 MEQ tablet Take 10 mEq by mouth daily. Every other day for leg cramps   Yes [provider]  pravastatin (PRAVACHOL) 80 MG tablet Take 1 tablet (80 mg total) by mouth daily. 05/06/22  Yes Glean Hess, MD    Allergies: Allergies  Allergen Reactions   Clavulanic Acid Other (See Comments)    Abdominal pain   Lisinopril Cough         Review of Systems: Review of Systems  Constitutional:  Negative for chills and fever.  HENT:  Negative for hearing loss.   Respiratory:  Negative for shortness of breath.   Cardiovascular:  Negative for chest pain.  Gastrointestinal:  Positive for nausea. Negative for abdominal pain.  Genitourinary:  Negative for dysuria.  Musculoskeletal:  Positive for joint pain. Negative for myalgias.  Skin:  Negative for rash.  Neurological:  Negative for dizziness.  Psychiatric/Behavioral:  Negative for depression.     Physical Exam BP 137/76   Pulse 71   Temp 98.1 F (36.7 C) (Oral)  Ht '5\' 4"'$  (1.626 m)   Wt 186 lb 3.2 oz (84.5 kg)   SpO2 94%   BMI 31.96 kg/m  CONSTITUTIONAL: No acute distress, well nourished. HEENT:  Normocephalic, atraumatic, extraocular motion intact. NECK:  Trachea is midline, no jugular venous distention RESPIRATORY:  Normal respiratory effort without pathologic use of accessory muscles. CARDIOVASCULAR: Regular rhythm and rate GI: The abdomen is soft, non-distended, currently non-tender to palpation.  Negative Murphy's sign. MUSCULOSKELETAL:  Normal gait, no  peripheral edema. NEUROLOGIC:  Motor and sensation is grossly normal.  Cranial nerves are grossly intact. PSYCH:  Alert and oriented to person, place and time. Affect is normal.  Labs/Imaging: Ultrasound RUQ on 05/22/22: IMPRESSION: 1. Cholelithiasis without evidence of acute cholecystitis or biliary dilatation. 2. Unremarkable liver.  Assessment and Plan: This is a 71 y.o. female with symptomatic cholelithiasis.  --The patient had another episode of biliary colic last week and she would like to schedule surgery so this does not keep recurring.  Discussed the plan for a robotic assisted cholecystectomy.  Reviewed the surgery at length including the incisions, risks of bleeding, infection, injury to surrounding structures, that this is an outpatient surgery, post-op activity restrictions, pain control, and she's willing to proceed. --Will schedule her for surgery on 09/26/22.  Will send also for medical clearance. --All of her questions have been answered.  I spent 40 minutes dedicated to the care of this patient on the date of this encounter to include pre-visit review of records, face-to-face time with the patient discussing diagnosis and management, and any post-visit coordination of care.   Melvyn Neth, [facility]

## 2022-09-09 NOTE — Telephone Encounter (Signed)
Patient has been advised of Pre-Admission date/time, and Surgery date at [facility].  Surgery Date: 09/26/21 Preadmission Testing Date: 09/18/22 (phone 8a-1p)  Patient has been made aware to call [phone], between 1-3:00pm the day before surgery, to find out what time to arrive for surgery.

## 2022-09-09 NOTE — Patient Instructions (Signed)
Our surgery scheduler Barbara will call you within 24-48 hours to get you scheduled. If you have not heard from her after 48 hours, please call our office. Have the blue sheet available when she calls to write down important information.   If you have any concerns or questions, please feel free to call our office.    Minimally Invasive Cholecystectomy  Minimally invasive cholecystectomy is surgery to remove the gallbladder. The gallbladder is a pear-shaped organ that lies beneath the liver on the right side of the body. The gallbladder stores bile, which is a fluid that helps the body digest fats. Cholecystectomy is often done to treat inflammation (irritation and swelling) of the gallbladder (cholecystitis). This condition is usually caused by a buildup of gallstones (cholelithiasis) in the gallbladder or when the fluid in the gall bladder becomes stagnant because gallstones get stuck in the ducts (tubes) and block the flow of bile. This can result in inflammation and pain. In severe cases, emergency surgery may be required. This procedure is done through small incisions in the abdomen, instead of one large incision. It is also called laparoscopic surgery. A thin scope with a camera (laparoscope) is inserted through one incision. Then surgical instruments are inserted through the other incisions. In some cases, a minimally invasive surgery may need to be changed to a surgery that is done through a larger incision. This is called open surgery. Tell a health care provider about: Any allergies you have. All medicines you are taking, including vitamins, herbs, eye drops, creams, and over-the-counter medicines. Any problems you or family members have had with anesthetic medicines. Any bleeding problems you have. Any surgeries you have had. Any medical conditions you have. Whether you are pregnant or may be pregnant. What are the risks? Generally, this is a safe procedure. However, problems may occur,  including: Infection. Bleeding. Allergic reactions to medicines. Damage to nearby structures or organs. A gallstone remaining in the common bile duct. The common bile duct carries bile from the gallbladder to the small intestine. A bile leak from the liver or cystic duct after your gallbladder is removed. What happens before the procedure? When to stop eating and drinking Follow instructions from your health care provider about what you may eat and drink before your procedure. These may include: 8 hours before the procedure Stop eating most foods. Do not eat meat, fried foods, or fatty foods. Eat only light foods, such as toast or crackers. All liquids are okay except energy drinks and alcohol. 6 hours before the procedure Stop eating. Drink only clear liquids, such as water, clear fruit juice, black coffee, plain tea, and sports drinks. Do not drink energy drinks or alcohol. 2 hours before the procedure Stop drinking all liquids. You may be allowed to take medicines with small sips of water. If you do not follow your health care provider's instructions, your procedure may be delayed or canceled. Medicines Ask your health care provider about: Changing or stopping your regular medicines. This is especially important if you are taking diabetes medicines or blood thinners. Taking medicines such as aspirin and ibuprofen. These medicines can thin your blood. Do not take these medicines unless your health care provider tells you to take them. Taking over-the-counter medicines, vitamins, herbs, and supplements. General instructions If you will be going home right after the procedure, plan to have a responsible adult: Take you home from the hospital or clinic. You will not be allowed to drive. Care for you for the time you are   told. Do not use any products that contain nicotine or tobacco for at least 4 weeks before the procedure. These products include cigarettes, chewing tobacco, and vaping  devices, such as e-cigarettes. If you need help quitting, ask your health care provider. Ask your health care provider: How your surgery site will be marked. What steps will be taken to help prevent infection. These may include: Removing hair at the surgery site. Washing skin with a germ-killing soap. Taking antibiotic medicine. What happens during the procedure?  An IV will be inserted into one of your veins. You will be given one or both of the following: A medicine to help you relax (sedative). A medicine to make you fall asleep (general anesthetic). Your surgeon will make several small incisions in your abdomen. The laparoscope will be inserted through one of the small incisions. The camera on the laparoscope will send images to a monitor in the operating room. This lets your surgeon see inside your abdomen. A gas will be pumped into your abdomen. This will expand your abdomen to give the surgeon more room to perform the surgery. Other tools that are needed for the procedure will be inserted through the other incisions. The gallbladder will be removed through one of the incisions. Your common bile duct may be examined. If stones are found in the common bile duct, they may be removed. After your gallbladder has been removed, the incisions will be closed with stitches (sutures), staples, or skin glue. Your incisions will be covered with a bandage (dressing). The procedure may vary among health care providers and hospitals. What happens after the procedure? Your blood pressure, heart rate, breathing rate, and blood oxygen level will be monitored until you leave the hospital or clinic. You will be given medicines as needed to control your pain. You may have a drain placed in the incision. The drain will be removed a day or two after the procedure. Summary Minimally invasive cholecystectomy, also called laparoscopic cholecystectomy, is surgery to remove the gallbladder using small  incisions. Tell your health care provider about all the medical conditions you have and all the medicines you are taking for those conditions. Before the procedure, follow instructions about when to stop eating and drinking and changing or stopping medicines. Plan to have a responsible adult care for you for the time you are told after you leave the hospital or clinic. This information is not intended to replace advice given to you by your health care provider. Make sure you discuss any questions you have with your health care provider. Document Revised: 03/13/2021 Document Reviewed: 03/13/2021 Elsevier Patient Education  2023 [employer]  

## 2022-09-10 ENCOUNTER — Ambulatory Visit (INDEPENDENT_AMBULATORY_CARE_PROVIDER_SITE_OTHER): Payer: Medicare Other | Admitting: Internal Medicine

## 2022-09-10 ENCOUNTER — Encounter: Payer: Self-pay | Admitting: Internal Medicine

## 2022-09-10 VITALS — BP 128/76 | Ht 64.0 in | Wt 186.0 lb

## 2022-09-10 DIAGNOSIS — Z01818 Encounter for other preprocedural examination: Secondary | ICD-10-CM | POA: Diagnosis not present

## 2022-09-10 DIAGNOSIS — K8020 Calculus of gallbladder without cholecystitis without obstruction: Secondary | ICD-10-CM | POA: Diagnosis not present

## 2022-09-10 NOTE — Progress Notes (Signed)
Date:  09/10/2022   Name:  Martha Walsh   DOB:  [birth year]   MRN:  [id number]   Chief Complaint: Surgery Clearance  Abdominal Pain This is a recurrent problem. The current episode started more than 1 year ago. The problem occurs intermittently. The problem has been gradually worsening. The abdominal pain radiates to the RUQ. Pertinent negatives include no fever or headaches.   Had a borderline abnormal EKG at [facility] - not visible and went for a stress test which was normal.  She regularly climbs the stairs in her house without issue, travelled around [place] without limitation.  No chest pains or shortness of breath; mobility limited by orthopedic issues.  Myocardial stress test 12/2018 Impressions:  - Normal myocardial perfusion study  - No evidence for significant ischemia or scar is noted.  - Post stress: Global systolic function is hyperdynamic. The ejection  fraction was greater than 65%. Left ventricular chamber size is relatively  small.  - Coronary calcifications are noted  - No significant change when compared to prior study from 10/2015  Lab Results  Component Value Date   NA 140 05/21/2022   K 4.5 05/21/2022   CO2 22 05/21/2022   GLUCOSE 86 05/21/2022   BUN 18 05/21/2022   CREATININE 1.11 (H) 05/21/2022   CALCIUM 9.4 05/21/2022   EGFR 52 (L) 05/21/2022   GFRNONAA 56 04/18/2020   Lab Results  Component Value Date   CHOL 235 (H) 05/06/2022   HDL 66 05/06/2022   LDLCALC 146 (H) 05/06/2022   TRIG 133 05/06/2022   CHOLHDL 3.6 05/06/2022   Lab Results  Component Value Date   TSH 5.950 (H) 05/06/2022   No results found for: "HGBA1C" Lab Results  Component Value Date   WBC 5.7 05/21/2022   HGB 12.1 05/21/2022   HCT 36.3 05/21/2022   MCV 87 05/21/2022   PLT 212 05/21/2022   Lab Results  Component Value Date   ALT 13 05/21/2022   AST 18 05/21/2022   ALKPHOS 69 05/21/2022   BILITOT 0.4 05/21/2022   No results found for: "25OHVITD2", "25OHVITD3", "VD25OH"    Review of Systems  Constitutional:  Negative for chills, fatigue and fever.  Respiratory:  Negative for chest tightness, shortness of breath and wheezing.   Cardiovascular:  Negative for chest pain and leg swelling.  Gastrointestinal:  Positive for abdominal pain.  Neurological:  Negative for dizziness, light-headedness and headaches.  Psychiatric/Behavioral:  Negative for dysphoric mood and sleep disturbance. The patient is not nervous/anxious.     Patient Active Problem List   Diagnosis Date Noted   Low back pain 10/05/2021   History of nonmelanoma skin cancer 12/04/2020   Primary osteoarthritis of right knee 12/01/2020   Murmur 01/03/2020   Generalized anxiety disorder 06/04/2019   Status post left partial knee replacement 03/12/2016   Acquired hypothyroidism 02/07/2016   Atypical mole 11/22/2015   Gallstone 10/04/2015   Gastro-esophageal reflux disease without esophagitis 08/04/2015   Nocturnal leg cramps 11/22/2013   Obesity (BMI 30-39.9) 11/22/2013   Mixed hyperlipidemia 09/28/2013    Allergies  Allergen Reactions   Clavulanic Acid Other (See Comments)    Abdominal pain   Lisinopril Cough         Past Surgical History:  Procedure Laterality Date   APPENDECTOMY     ESOPHAGOGASTRODUODENOSCOPY (EGD) WITH PROPOFOL N/A 07/27/2020   Procedure: ESOPHAGOGASTRODUODENOSCOPY (EGD) WITH PROPOFOL;  Surgeon: Lucilla Lame, MD;  Location: [facility];  Service: Endoscopy;  Laterality: N/A;  JOINT REPLACEMENT Left 02/2016   partial knee   TONSILLECTOMY      Social History   Tobacco Use   Smoking status: Former    Types: Cigarettes    Quit date: 09/23/1998    Years since quitting: 23.9    Passive exposure: Past   Smokeless tobacco: Never  Vaping Use   Vaping Use: Never used  Substance Use Topics   Alcohol use: Yes    Comment: occasionally   Drug use: Never     Medication list has been reviewed and updated.  Current Meds  Medication Sig   amoxicillin  (AMOXIL) 500 MG tablet TAKE 4 TABLETS BY MOUTH (2 GRAMS) 60 MINUTES BEFORE DENTAL PROCEDURE   Calcium Carbonate-Vitamin D 600-400 MG-UNIT tablet Take 1 tablet by mouth daily.   Cholecalciferol 25 MCG (1000 UT) tablet Take 1,000 Units by mouth daily.   citalopram (CELEXA) 10 MG tablet Take 1 tablet (10 mg total) by mouth daily.   clonazePAM (KLONOPIN) 0.5 MG tablet Take 0.5 mg by mouth as needed for anxiety.   cyclobenzaprine (FLEXERIL) 5 MG tablet Take 5 mg by mouth as needed.   ezetimibe (ZETIA) 10 MG tablet Take 1 tablet (10 mg total) by mouth daily.   ibuprofen (ADVIL) 800 MG tablet Take 800 mg by mouth 3 (three) times daily. PRN   levothyroxine (SYNTHROID) 50 MCG tablet TAKE 1 TABLET BY MOUTH ONCE DAILY BEFORE BREAKFAST   pantoprazole (PROTONIX) 40 MG tablet Take 1 tablet (40 mg total) by mouth daily.   potassium chloride (KLOR-CON) 10 MEQ tablet Take 10 mEq by mouth daily. Every other day for leg cramps   pravastatin (PRAVACHOL) 80 MG tablet Take 1 tablet (80 mg total) by mouth daily.       09/10/2022   10:33 AM 05/21/2022    2:11 PM 05/06/2022    8:50 AM 01/14/2022    2:54 PM  GAD 7 : Generalized Anxiety Score  Nervous, Anxious, on Edge 0 0 0 0  Control/stop worrying 0 0 0 0  Worry too much - different things 0 0 0 0  Trouble relaxing 0 0 0 0  Restless 0 0 0 0  Easily annoyed or irritable 0 0 0 0  Afraid - awful might happen 0 0 0 0  Total GAD 7 Score 0 0 0 0  Anxiety Difficulty Not difficult at all Not difficult at all Not difficult at all Not difficult at all       09/10/2022   10:32 AM 05/21/2022    2:11 PM 05/06/2022    8:50 AM  Depression screen PHQ 2/9  Decreased Interest 0 0 0  Down, Depressed, Hopeless 0 0 0  PHQ - 2 Score 0 0 0  Altered sleeping 0 0 0  Tired, decreased energy 0 0 0  Change in appetite 0 0 0  Feeling bad or failure about yourself  0 0 0  Trouble concentrating 0 0 0  Moving slowly or fidgety/restless 0 0 0  Suicidal thoughts 0 0 0  PHQ-9 Score  0 0 0  Difficult doing work/chores Not difficult at all Not difficult at all     BP Readings from Last 3 Encounters:  09/10/22 128/76  09/09/22 137/76  06/10/22 126/68    Physical Exam Vitals and nursing note reviewed.  Constitutional:      General: She is not in acute distress.    Appearance: Normal appearance. She is well-developed.  HENT:     Head: Normocephalic and atraumatic.  Neck:     Vascular: No carotid bruit.  Cardiovascular:     Rate and Rhythm: Normal rate and regular rhythm.     Heart sounds: No murmur heard. Pulmonary:     Effort: Pulmonary effort is normal. No respiratory distress.     Breath sounds: No wheezing or rhonchi.  Musculoskeletal:     Cervical back: Normal range of motion.     Right lower leg: No edema.     Left lower leg: No edema.  Skin:    General: Skin is warm and dry.     Findings: No rash.  Neurological:     Mental Status: She is alert and oriented to person, place, and time.  Psychiatric:        Mood and Affect: Mood normal.        Behavior: Behavior normal.     Wt Readings from Last 3 Encounters:  09/10/22 186 lb (84.4 kg)  09/09/22 186 lb 3.2 oz (84.5 kg)  06/10/22 182 lb (82.6 kg)    BP 128/76   Ht _0  (1.626 m)   Wt 186 lb (84.4 kg)   BMI 31.93 kg/m   Assessment and Plan: 1. Preop general physical exam Low risk procedure to be done laparoscopically Normal stress test done in 2020 No DM, HTN, smoking hx.  Exercise tolerance is fair. Will sign off on surgery clearance from medical standpoint. - EKG 12-Lead - NSR @ 69, possible old anterior infarct - unable to compare to outside EKGs  2. Calculus of gallbladder without cholecystitis without obstruction Cleared for elective surgery.   Partially dictated using Editor, commissioning. Any errors are unintentional.  Halina Maidens, MD [facility]  09/10/2022

## 2022-09-10 NOTE — Progress Notes (Unsigned)
Received Medical Clearance from Dr Army Melia. The patient is cleared at Low risk for surgery.

## 2022-09-18 ENCOUNTER — Encounter
Admission: RE | Admit: 2022-09-18 | Discharge: 2022-09-18 | Disposition: A | Discharge status: Home / Self Care | Payer: Medicare Other | Source: Ambulatory Visit | Attending: Surgery | Admitting: Surgery

## 2022-09-18 ENCOUNTER — Other Ambulatory Visit: Payer: Self-pay

## 2022-09-18 HISTORY — DX: Unspecified osteoarthritis, unspecified site: M19.90

## 2022-09-18 HISTORY — DX: Anxiety disorder, unspecified: F41.9

## 2022-09-18 NOTE — Patient Instructions (Signed)
Your procedure is scheduled on: 09/26/22 Report to [place]. To find out your arrival time please call [phone] between 1PM - 3PM on 09/25/22.  Remember: Instructions that are not followed completely may result in serious medical risk, up to and including death, or upon the discretion of your surgeon and anesthesiologist your surgery may need to be rescheduled.     _X__ 1. Do not eat food after midnight the night before your procedure.                 No gum chewing or hard candies. You may drink clear liquids up to 2 hours                 before you are scheduled to arrive for your surgery- DO not drink clear                 liquids within 2 hours of the start of your surgery.                 Clear Liquids include:  water, apple juice without pulp, clear carbohydrate                 drink such as Clearfast or Gatorade, Black Coffee or Tea (Do not add                 anything to coffee or tea). Diabetics water only  __X__2.  On the morning of surgery brush your teeth with toothpaste and water, you                 may rinse your mouth with mouthwash if you wish.  Do not swallow any              toothpaste of mouthwash.     _X__ 3.  No Alcohol for 24 hours before or after surgery.   _X__ 4.  Do Not Smoke or use e-cigarettes For 24 Hours Prior to Your Surgery.                 Do not use any chewable tobacco products for at least 6 hours prior to                 surgery.  ____  5.  Bring all medications with you on the day of surgery if instructed.   __X__  6.  Notify your doctor if there is any change in your medical condition      (cold, fever, infections).     Do not wear jewelry, make-up, hairpins, clips or nail polish. Do not wear lotions, powders, or perfumes. You may wear deodorant. Do not shave body hair 48 hours prior to surgery. Men may shave face and neck. Do not bring valuables to the hospital.    [facility] is not  responsible for any belongings or valuables.  Contacts, dentures/partials or body piercings may not be worn into surgery. Bring a case for your contacts, glasses or hearing aids, a denture cup will be supplied. Leave your suitcase in the car. After surgery it may be brought to your room. For patients admitted to the hospital, discharge time is determined by your treatment team.   Patients discharged the day of surgery will not be allowed to drive home.    __X__ Take these medicines the morning of surgery with A SIP OF WATER:    1. levothyroxine (SYNTHROID) 50 MCG tablet   2.  pantoprazole (PROTONIX) 40 MG tablet   3.   4.  5.  6.  ____ Fleet Enema (as directed)   ____ Use CHG Soap/SAGE wipes as directed  ____ Use inhalers on the day of surgery  ____ Stop metformin/Janumet/Farxiga 2 days prior to surgery    ____ Take 1/2 of usual insulin dose the night before surgery. No insulin the morning          of surgery.   ____ Stop Blood Thinners Coumadin/Plavix/Xarelto/Pleta/Pradaxa/Eliquis/Effient/Aspirin  on   Or contact your Surgeon, Cardiologist or Medical Doctor regarding  ability to stop your blood thinners  __X__ Stop Anti-inflammatories 7 days before surgery such as Advil, Ibuprofen, Motrin,  BC or Goodies Powder, Naprosyn, Naproxen, Aleve, Aspirin    __X__ Stop all herbals and supplements, fish oil or vitamins  until after surgery.    ____ Bring C-Pap to the hospital.    You may use OTC Tylenol if needed Shower the morning of surgery. You may use Dial soap if on hand.

## 2022-09-23 HISTORY — PX: CHOLECYSTECTOMY: SHX55

## 2022-09-25 MED ORDER — ACETAMINOPHEN 500 MG PO TABS: 1000.0000 mg | ORAL_TABLET | ORAL | Status: AC

## 2022-09-25 MED ORDER — GABAPENTIN 300 MG PO CAPS: 300.0000 mg | ORAL_CAPSULE | ORAL | Status: AC

## 2022-09-25 MED ORDER — CHLORHEXIDINE GLUCONATE CLOTH 2 % EX PADS
6.0000 | MEDICATED_PAD | Freq: Once | CUTANEOUS | Status: AC
  Administered 2022-09-26: 6 via TOPICAL

## 2022-09-25 MED ORDER — INDOCYANINE GREEN 25 MG IV SOLR
2.5000 mg | INTRAVENOUS | Status: AC
  Administered 2022-09-26: 2.5 mg via INTRAVENOUS
  Filled 2022-09-25: qty 1

## 2022-09-25 MED ORDER — CHLORHEXIDINE GLUCONATE 0.12 % MT SOLN: 15.0000 mL | Freq: Once | OROMUCOSAL | Status: AC

## 2022-09-25 MED ORDER — ORAL CARE MOUTH RINSE: 15.0000 mL | Freq: Once | OROMUCOSAL | Status: AC

## 2022-09-25 MED ORDER — LACTATED RINGERS IV SOLN: INTRAVENOUS | Status: DC

## 2022-09-25 MED ORDER — CHLORHEXIDINE GLUCONATE CLOTH 2 % EX PADS: 6.0000 | MEDICATED_PAD | Freq: Once | CUTANEOUS | Status: DC

## 2022-09-25 MED ORDER — CEFAZOLIN SODIUM-DEXTROSE 2-4 GM/100ML-% IV SOLN
2.0000 g | INTRAVENOUS | Status: AC
  Administered 2022-09-26: 2 g via INTRAVENOUS

## 2022-09-26 ENCOUNTER — Ambulatory Visit: Payer: Medicare Other | Admitting: Gastroenterology

## 2022-09-26 ENCOUNTER — Other Ambulatory Visit: Payer: Self-pay

## 2022-09-26 ENCOUNTER — Ambulatory Visit: Payer: Medicare Other | Admitting: Anesthesiology

## 2022-09-26 ENCOUNTER — Encounter: Payer: Self-pay | Admitting: Surgery

## 2022-09-26 ENCOUNTER — Ambulatory Visit
Admission: RE | Admit: 2022-09-26 | Discharge: 2022-09-26 | Disposition: A | Discharge status: Home / Self Care | Payer: Medicare Other | Source: Ambulatory Visit | Attending: Surgery | Admitting: Surgery

## 2022-09-26 ENCOUNTER — Encounter
Admission: RE | Disposition: A | Discharge status: Home / Self Care | Payer: Self-pay | Source: Ambulatory Visit | Attending: Surgery

## 2022-09-26 DIAGNOSIS — K219 Gastro-esophageal reflux disease without esophagitis: Secondary | ICD-10-CM | POA: Insufficient documentation

## 2022-09-26 DIAGNOSIS — E785 Hyperlipidemia, unspecified: Secondary | ICD-10-CM | POA: Insufficient documentation

## 2022-09-26 DIAGNOSIS — Z96652 Presence of left artificial knee joint: Secondary | ICD-10-CM | POA: Diagnosis not present

## 2022-09-26 DIAGNOSIS — Z9851 Tubal ligation status: Secondary | ICD-10-CM | POA: Insufficient documentation

## 2022-09-26 DIAGNOSIS — I252 Old myocardial infarction: Secondary | ICD-10-CM | POA: Diagnosis not present

## 2022-09-26 DIAGNOSIS — K8020 Calculus of gallbladder without cholecystitis without obstruction: Secondary | ICD-10-CM | POA: Diagnosis not present

## 2022-09-26 DIAGNOSIS — Z87891 Personal history of nicotine dependence: Secondary | ICD-10-CM | POA: Diagnosis not present

## 2022-09-26 DIAGNOSIS — K8010 Calculus of gallbladder with chronic cholecystitis without obstruction: Secondary | ICD-10-CM | POA: Insufficient documentation

## 2022-09-26 DIAGNOSIS — E039 Hypothyroidism, unspecified: Secondary | ICD-10-CM | POA: Insufficient documentation

## 2022-09-26 SURGERY — CHOLECYSTECTOMY, ROBOT-ASSISTED, LAPAROSCOPIC
Anesthesia: General | Site: Abdomen

## 2022-09-26 MED ORDER — PHENYLEPHRINE 80 MCG/ML (10ML) SYRINGE FOR IV PUSH (FOR BLOOD PRESSURE SUPPORT)
PREFILLED_SYRINGE | INTRAVENOUS | Status: AC
  Filled 2022-09-26: qty 20

## 2022-09-26 MED ORDER — BUPIVACAINE LIPOSOME 1.3 % IJ SUSP
INTRAMUSCULAR | Status: AC
  Filled 2022-09-26: qty 10

## 2022-09-26 MED ORDER — DEXAMETHASONE SODIUM PHOSPHATE 10 MG/ML IJ SOLN
INTRAMUSCULAR | Status: AC
  Filled 2022-09-26: qty 3

## 2022-09-26 MED ORDER — BUPIVACAINE-EPINEPHRINE (PF) 0.5% -1:200000 IJ SOLN
INTRAMUSCULAR | Status: AC
  Filled 2022-09-26: qty 30

## 2022-09-26 MED ORDER — ONDANSETRON HCL 4 MG/2ML IJ SOLN
INTRAMUSCULAR | Status: AC
  Filled 2022-09-26: qty 12

## 2022-09-26 MED ORDER — FENTANYL CITRATE (PF) 100 MCG/2ML IJ SOLN
INTRAMUSCULAR | Status: AC
  Filled 2022-09-26: qty 2

## 2022-09-26 MED ORDER — IBUPROFEN 800 MG PO TABS: 800.0000 mg | ORAL_TABLET | Freq: Three times a day (TID) | ORAL | 1 refills | Status: DC | PRN

## 2022-09-26 MED ORDER — GLYCOPYRROLATE 0.2 MG/ML IJ SOLN
INTRAMUSCULAR | Status: AC
  Filled 2022-09-26: qty 3

## 2022-09-26 MED ORDER — ONDANSETRON HCL 4 MG/2ML IJ SOLN: 4.0000 mg | Freq: Once | INTRAMUSCULAR | Status: DC | PRN

## 2022-09-26 MED ORDER — PHENYLEPHRINE 80 MCG/ML (10ML) SYRINGE FOR IV PUSH (FOR BLOOD PRESSURE SUPPORT)
PREFILLED_SYRINGE | INTRAVENOUS | Status: DC | PRN
  Administered 2022-09-26: 160 ug via INTRAVENOUS

## 2022-09-26 MED ORDER — ROCURONIUM BROMIDE 10 MG/ML (PF) SYRINGE
PREFILLED_SYRINGE | INTRAVENOUS | Status: AC
  Filled 2022-09-26: qty 30

## 2022-09-26 MED ORDER — DEXMEDETOMIDINE HCL IN NACL 200 MCG/50ML IV SOLN
INTRAVENOUS | Status: DC | PRN
  Administered 2022-09-26: 8 ug via INTRAVENOUS

## 2022-09-26 MED ORDER — PROPOFOL 1000 MG/100ML IV EMUL
INTRAVENOUS | Status: AC
  Filled 2022-09-26: qty 100

## 2022-09-26 MED ORDER — ROCURONIUM BROMIDE 100 MG/10ML IV SOLN
INTRAVENOUS | Status: DC | PRN
  Administered 2022-09-26: 40 mg via INTRAVENOUS
  Administered 2022-09-26: 10 mg via INTRAVENOUS

## 2022-09-26 MED ORDER — CHLORHEXIDINE GLUCONATE 0.12 % MT SOLN
OROMUCOSAL | Status: AC
  Administered 2022-09-26: 15 mL via OROMUCOSAL
  Filled 2022-09-26: qty 15

## 2022-09-26 MED ORDER — DEXAMETHASONE SODIUM PHOSPHATE 10 MG/ML IJ SOLN
INTRAMUSCULAR | Status: DC | PRN
  Administered 2022-09-26: 10 mg via INTRAVENOUS

## 2022-09-26 MED ORDER — BUPIVACAINE HCL (PF) 0.25 % IJ SOLN
INTRAMUSCULAR | Status: AC
  Filled 2022-09-26: qty 30

## 2022-09-26 MED ORDER — ACETAMINOPHEN 500 MG PO TABS
ORAL_TABLET | ORAL | Status: AC
  Administered 2022-09-26: 1000 mg via ORAL
  Filled 2022-09-26: qty 2

## 2022-09-26 MED ORDER — PHENYLEPHRINE HCL (PRESSORS) 10 MG/ML IV SOLN
INTRAVENOUS | Status: AC
  Filled 2022-09-26: qty 1

## 2022-09-26 MED ORDER — OXYCODONE HCL 5 MG PO TABS: 5.0000 mg | ORAL_TABLET | Freq: Four times a day (QID) | ORAL | 0 refills | Status: DC | PRN

## 2022-09-26 MED ORDER — FENTANYL CITRATE (PF) 100 MCG/2ML IJ SOLN
INTRAMUSCULAR | Status: DC | PRN
  Administered 2022-09-26 (×2): 50 ug via INTRAVENOUS

## 2022-09-26 MED ORDER — SUCCINYLCHOLINE CHLORIDE 200 MG/10ML IV SOSY
PREFILLED_SYRINGE | INTRAVENOUS | Status: DC | PRN
  Administered 2022-09-26: 100 mg via INTRAVENOUS

## 2022-09-26 MED ORDER — SUCCINYLCHOLINE CHLORIDE 200 MG/10ML IV SOSY
PREFILLED_SYRINGE | INTRAVENOUS | Status: AC
  Filled 2022-09-26: qty 30

## 2022-09-26 MED ORDER — ACETAMINOPHEN 500 MG PO TABS: 1000.0000 mg | ORAL_TABLET | Freq: Four times a day (QID) | ORAL | Status: AC | PRN

## 2022-09-26 MED ORDER — ONDANSETRON HCL 4 MG/2ML IJ SOLN
INTRAMUSCULAR | Status: DC | PRN
  Administered 2022-09-26 (×2): 4 mg via INTRAVENOUS

## 2022-09-26 MED ORDER — DEXMEDETOMIDINE HCL IN NACL 80 MCG/20ML IV SOLN
INTRAVENOUS | Status: AC
  Filled 2022-09-26: qty 20

## 2022-09-26 MED ORDER — SUGAMMADEX SODIUM 500 MG/5ML IV SOLN
INTRAVENOUS | Status: AC
  Filled 2022-09-26: qty 10

## 2022-09-26 MED ORDER — SUGAMMADEX SODIUM 500 MG/5ML IV SOLN
INTRAVENOUS | Status: DC | PRN
  Administered 2022-09-26: 500 mg via INTRAVENOUS

## 2022-09-26 MED ORDER — PROPOFOL 10 MG/ML IV BOLUS
INTRAVENOUS | Status: DC | PRN
  Administered 2022-09-26: 200 mg via INTRAVENOUS

## 2022-09-26 MED ORDER — EPHEDRINE 5 MG/ML INJ
INTRAVENOUS | Status: AC
  Filled 2022-09-26: qty 5

## 2022-09-26 MED ORDER — BUPIVACAINE-EPINEPHRINE (PF) 0.5% -1:200000 IJ SOLN
INTRAMUSCULAR | Status: DC | PRN
  Administered 2022-09-26: 40 mL via SURGICAL_CAVITY

## 2022-09-26 MED ORDER — LIDOCAINE HCL (CARDIAC) PF 100 MG/5ML IV SOSY
PREFILLED_SYRINGE | INTRAVENOUS | Status: DC | PRN
  Administered 2022-09-26: 100 mg via INTRAVENOUS

## 2022-09-26 MED ORDER — OXYCODONE HCL 5 MG PO TABS
ORAL_TABLET | ORAL | Status: AC
  Administered 2022-09-26: 5 mg
  Filled 2022-09-26: qty 1

## 2022-09-26 MED ORDER — PHENYLEPHRINE HCL-NACL 20-0.9 MG/250ML-% IV SOLN
INTRAVENOUS | Status: DC | PRN
  Administered 2022-09-26: 25 ug/min via INTRAVENOUS

## 2022-09-26 MED ORDER — GLYCOPYRROLATE 0.2 MG/ML IJ SOLN
INTRAMUSCULAR | Status: DC | PRN
  Administered 2022-09-26: .2 mg via INTRAVENOUS

## 2022-09-26 MED ORDER — 0.9 % SODIUM CHLORIDE (POUR BTL) OPTIME
TOPICAL | Status: DC | PRN
  Administered 2022-09-26: 500 mL

## 2022-09-26 MED ORDER — FENTANYL CITRATE (PF) 100 MCG/2ML IJ SOLN
25.0000 ug | INTRAMUSCULAR | Status: DC | PRN
  Administered 2022-09-26 (×2): 25 ug via INTRAVENOUS

## 2022-09-26 MED ORDER — CEFAZOLIN SODIUM-DEXTROSE 2-4 GM/100ML-% IV SOLN
INTRAVENOUS | Status: AC
  Filled 2022-09-26: qty 100

## 2022-09-26 MED ORDER — LIDOCAINE HCL (PF) 2 % IJ SOLN
INTRAMUSCULAR | Status: AC
  Filled 2022-09-26: qty 15

## 2022-09-26 MED ORDER — GABAPENTIN 300 MG PO CAPS
ORAL_CAPSULE | ORAL | Status: AC
  Administered 2022-09-26: 300 mg via ORAL
  Filled 2022-09-26: qty 1

## 2022-09-26 MED ORDER — EPHEDRINE SULFATE (PRESSORS) 50 MG/ML IJ SOLN
INTRAMUSCULAR | Status: DC | PRN
  Administered 2022-09-26: 5 mg via INTRAVENOUS

## 2022-09-26 MED ORDER — MIDAZOLAM HCL 2 MG/2ML IJ SOLN
INTRAMUSCULAR | Status: AC
  Filled 2022-09-26: qty 2

## 2022-09-26 SURGICAL SUPPLY — 54 items
ADH SKN CLS APL DERMABOND .7 (GAUZE/BANDAGES/DRESSINGS) ×1
BAG PRESSURE INF REUSE 1000 (BAG) IMPLANT
CANNULA REDUC XI 12-8 STAPL (CANNULA) ×1
CANNULA REDUCER 12-8 DVNC XI (CANNULA) ×1 IMPLANT
CLIP LIGATING HEMO O LOK GREEN (MISCELLANEOUS) ×1 IMPLANT
CUP MEDICINE 2OZ PLAST GRAD ST (MISCELLANEOUS) ×1 IMPLANT
DERMABOND ADVANCED .7 DNX12 (GAUZE/BANDAGES/DRESSINGS) ×1 IMPLANT
DRAPE ARM DVNC X/XI (DISPOSABLE) ×4 IMPLANT
DRAPE COLUMN DVNC XI (DISPOSABLE) ×1 IMPLANT
DRAPE DA VINCI XI ARM (DISPOSABLE) ×4
DRAPE DA VINCI XI COLUMN (DISPOSABLE) ×1
ELECT CAUTERY BLADE TIP 2.5 (TIP) ×1
ELECT REM PT RETURN 9FT ADLT (ELECTROSURGICAL) ×1
ELECTRODE CAUTERY BLDE TIP 2.5 (TIP) ×1 IMPLANT
ELECTRODE REM PT RTRN 9FT ADLT (ELECTROSURGICAL) ×1 IMPLANT
GLOVE SURG SYN 7.0 (GLOVE) ×2 IMPLANT
GLOVE SURG SYN 7.0 PF PI (GLOVE) ×2 IMPLANT
GLOVE SURG SYN 7.5  E (GLOVE) ×2
GLOVE SURG SYN 7.5 E (GLOVE) ×2 IMPLANT
GLOVE SURG SYN 7.5 PF PI (GLOVE) ×2 IMPLANT
GOWN STRL REUS W/ TWL LRG LVL3 (GOWN DISPOSABLE) ×4 IMPLANT
GOWN STRL REUS W/TWL LRG LVL3 (GOWN DISPOSABLE) ×4
IRRIGATOR SUCT 8 DISP DVNC XI (IRRIGATION / IRRIGATOR) IMPLANT
IRRIGATOR SUCTION 8MM XI DISP (IRRIGATION / IRRIGATOR)
IV NS 1000ML (IV SOLUTION)
IV NS 1000ML BAXH (IV SOLUTION) IMPLANT
KIT PINK PAD W/HEAD ARE REST (MISCELLANEOUS) ×1
KIT PINK PAD W/HEAD ARM REST (MISCELLANEOUS) ×1 IMPLANT
LABEL OR SOLS (LABEL) ×1 IMPLANT
MANIFOLD NEPTUNE II (INSTRUMENTS) ×1 IMPLANT
NEEDLE HYPO 22GX1.5 SAFETY (NEEDLE) ×1 IMPLANT
NS IRRIG 500ML POUR BTL (IV SOLUTION) ×1 IMPLANT
OBTURATOR OPTICAL STANDARD 8MM (TROCAR) ×1
OBTURATOR OPTICAL STND 8 DVNC (TROCAR) ×1
OBTURATOR OPTICALSTD 8 DVNC (TROCAR) ×1 IMPLANT
PACK LAP CHOLECYSTECTOMY (MISCELLANEOUS) ×1 IMPLANT
PENCIL SMOKE EVACUATOR (MISCELLANEOUS) ×1 IMPLANT
SEAL CANN UNIV 5-8 DVNC XI (MISCELLANEOUS) ×3 IMPLANT
SEAL XI 5MM-8MM UNIVERSAL (MISCELLANEOUS) ×3
SET TUBE SMOKE EVAC HIGH FLOW (TUBING) ×1 IMPLANT
SOLUTION ELECTROLUBE (MISCELLANEOUS) ×1 IMPLANT
SPIKE FLUID TRANSFER (MISCELLANEOUS) ×1 IMPLANT
SPONGE T-LAP 18X18 ~~LOC~~+RFID (SPONGE) IMPLANT
SPONGE T-LAP 4X18 ~~LOC~~+RFID (SPONGE) ×1 IMPLANT
STAPLER CANNULA SEAL DVNC XI (STAPLE) ×1 IMPLANT
STAPLER CANNULA SEAL XI (STAPLE) ×1
SUT MNCRL AB 4-0 PS2 18 (SUTURE) ×1 IMPLANT
SUT VIC AB 3-0 SH 27 (SUTURE)
SUT VIC AB 3-0 SH 27X BRD (SUTURE) IMPLANT
SUT VICRYL 0 UR6 27IN ABS (SUTURE) ×2 IMPLANT
SYS BAG RETRIEVAL 10MM (BASKET) ×1
SYSTEM BAG RETRIEVAL 10MM (BASKET) ×1 IMPLANT
TRAP FLUID SMOKE EVACUATOR (MISCELLANEOUS) ×1 IMPLANT
WATER STERILE IRR 500ML POUR (IV SOLUTION) ×1 IMPLANT

## 2022-09-26 NOTE — Anesthesia Postprocedure Evaluation (Signed)
Anesthesia Post Note  Patient: Martha Walsh  Procedure(s) Performed: XI ROBOTIC ASSISTED LAPAROSCOPIC CHOLECYSTECTOMY (Abdomen) INDOCYANINE GREEN FLUORESCENCE IMAGING (ICG) (Abdomen)  Patient location during evaluation: PACU Anesthesia Type: General Level of consciousness: awake and awake and alert Pain management: pain level controlled Vital Signs Assessment: post-procedure vital signs reviewed and stable Respiratory status: spontaneous breathing and respiratory function stable Cardiovascular status: stable Anesthetic complications: no  No notable events documented.   Last Vitals:  Vitals:   09/26/22 0938 09/26/22 0946  BP:  121/82  Pulse: 73 71  Resp: 16 14  Temp: (!) 36.3 C (!) 35.5 C  SpO2: 91% 99%    Last Pain:  Vitals:   09/26/22 0946  TempSrc: Temporal  PainSc: 2                  VAN STAVEREN,Anani Gu

## 2022-09-26 NOTE — Interval H&P Note (Signed)
History and Physical Interval Note:  09/26/2022 7:12 AM  Martha Walsh  has presented today for surgery, with the diagnosis of symptomatic cholelithisis.  The various methods of treatment have been discussed with the patient and family. After consideration of risks, benefits and other options for treatment, the patient has consented to  Procedure(s): XI ROBOTIC ASSISTED LAPAROSCOPIC CHOLECYSTECTOMY (N/A) [place] (ICG) (N/A) as a surgical intervention.  The patient's history has been reviewed, patient examined, no change in status, stable for surgery.  I have reviewed the patient's chart and labs.  Questions were answered to the patient's satisfaction.     Anani Gu

## 2022-09-26 NOTE — Anesthesia Procedure Notes (Signed)

## 2022-09-26 NOTE — Op Note (Signed)
  Procedure Date:  09/26/2022  Pre-operative Diagnosis:  Symptomatic cholelithiasis  Post-operative Diagnosis: Symptomatic cholelithiasis  Procedure:  Robotic assisted cholecystectomy with ICG FireFly cholangiogram  Surgeon:  Melvyn Neth, MD  Anesthesia:  General endotracheal  Estimated Blood Loss:  5 ml  Specimens:  gallbladder  Complications:  None  Findings:  ICG cholangiogram revealed a patent cystic duct and common bile duct, with ICG flowing into the duodenum.  Indications for Procedure:  This is a 72 y.o. female who presents with abdominal pain and workup revealing symptomatic cholelithiasis.  The benefits, complications, treatment options, and expected outcomes were discussed with the patient. The risks of bleeding, infection, recurrence of symptoms, failure to resolve symptoms, bile duct damage, bile duct leak, retained common bile duct stone, bowel injury, and need for further procedures were all discussed with the patient and she was willing to proceed.  Description of Procedure: The patient was correctly identified in the preoperative area and brought into the operating room.  The patient was placed supine with VTE prophylaxis in place.  Appropriate time-outs were performed.  Anesthesia was induced and the patient was intubated.  Appropriate antibiotics were infused.  The abdomen was prepped and draped in a sterile fashion. An infraumbilical incision was made. A cutdown technique was used to enter the abdominal cavity without injury, and a 12 mm robotic port was inserted.  Pneumoperitoneum was obtained with appropriate opening pressures.  Three 8-mm ports were placed in the mid abdomen at the level of the umbilicus under direct visualization.  The DaVinci platform was docked, camera targeted, and instruments were placed under direct visualization.  The gallbladder was identified.  The fundus was grasped and retracted cephalad.  Adhesions were lysed bluntly and with  electrocautery. The infundibulum was grasped and retracted laterally, exposing the peritoneum overlying the gallbladder.  This was incised with electrocautery and extended on either side of the gallbladder.  FireFly cholangiogram was then obtained, and we were able to clearly identify the cystic duct and common bile duct, and cholangiogram revealed the ICG going into the duodenum, consistent with a patent cystic duct and common bile duct.  The cystic duct and cystic artery were carefully dissected with combination of cautery and blunt dissection.  Both were clipped twice proximally and once distally, cutting in between.  The gallbladder was taken from the gallbladder fossa in a retrograde fashion with electrocautery. The gallbladder was placed in an Endocatch bag. The liver bed was inspected and any bleeding was controlled with electrocautery. The right upper quadrant was then inspected again revealing intact clips, no bleeding, and no ductal injury.  The 8 mm ports were removed under direct visualization and the 12 mm port was removed.  The Endocatch bag was brought out via the umbilical incision. The fascial opening was closed using 0 vicryl suture.  Local anesthetic was infused in all incisions and the incisions were closed with 4-0 Monocryl.  The wounds were cleaned and sealed with DermaBond.  The patient was emerged from anesthesia and extubated and brought to the recovery room for further management.  The patient tolerated the procedure well and all counts were correct at the end of the case.   Melvyn Neth, MD

## 2022-09-26 NOTE — Discharge Instructions (Addendum)
AMBULATORY SURGERY  DISCHARGE INSTRUCTIONS   The drugs that you were given will stay in your system until tomorrow so for the next 24 hours you should not:  Drive an automobile Make any legal decisions Drink any alcoholic beverage   You may resume regular meals tomorrow.  Today it is better to start with liquids and gradually work up to solid foods.  You may eat anything you prefer, but it is better to start with liquids, then soup and crackers, and gradually work up to solid foods.   Please notify your doctor immediately if you have any unusual bleeding, trouble breathing, redness and pain at the surgery site, drainage, fever, or pain not relieved by medication.    Additional Instructions:        Please contact your physician with any problems or Same Day Surgery at [phone], Monday through Friday 6 am to 4 pm, or [place] [facility] number at [phone].Discharge Instructions: 1.  Patient may shower, but do not scrub wounds heavily and dab dry only. 2.  Do not submerge wounds in pool/tub until fully healed. 3.  Do not apply ointments or hydrogen peroxide to the wounds. 4.  May apply ice packs to the wounds for comfort. 5.  No heavy lifting or pushing of more than 10-15 lbs for 4 weeks. 6.  Do not drive while taking narcotics for pain control.  Prior to driving, make sure you are able to rotate right and left to look at blindspots without significant pain or discomfort.

## 2022-09-26 NOTE — Transfer of Care (Signed)
Immediate Anesthesia Transfer of Care Note  Patient: Martha Walsh  Procedure(s) Performed: XI ROBOTIC ASSISTED LAPAROSCOPIC CHOLECYSTECTOMY (Abdomen) INDOCYANINE GREEN FLUORESCENCE IMAGING (ICG) (Abdomen)  Patient Location: PACU  Anesthesia Type:General  Level of Consciousness: awake, drowsy, and patient cooperative  Airway & Oxygen Therapy: Patient Spontanous Breathing and Patient connected to face mask oxygen  Post-op Assessment: Report given to RN and Post -op Vital signs reviewed and stable  Post vital signs: Reviewed and stable  Last Vitals:  Vitals Value Taken Time  BP 140/74 09/26/22 0905  Temp    Pulse 80 09/26/22 0908  Resp 18 09/26/22 0908  SpO2 100 % 09/26/22 0908  Vitals shown include unvalidated device data.  Last Pain:  Vitals:   09/26/22 0643  TempSrc: Temporal  PainSc: 0-No pain         Complications: No notable events documented.

## 2022-09-26 NOTE — Anesthesia Preprocedure Evaluation (Signed)
Anesthesia Evaluation  Patient identified by MRN, date of birth, ID band Patient awake    Reviewed: Allergy & Precautions, NPO status , Patient's Chart, lab work & pertinent test results  Airway Mallampati: III  TM Distance: >3 FB Neck ROM: full    Dental  (+) Teeth Intact   Pulmonary neg pulmonary ROS, former smoker   Pulmonary exam normal breath sounds clear to auscultation       Cardiovascular Exercise Tolerance: Good + Past MI  negative cardio ROS Normal cardiovascular exam Rhythm:Regular     Neuro/Psych   Anxiety     negative neurological ROS  negative psych ROS   GI/Hepatic negative GI ROS, Neg liver ROS,GERD  Medicated,,  Endo/Other  negative endocrine ROSHypothyroidism    Renal/GU negative Renal ROS     Musculoskeletal   Abdominal Normal abdominal exam  (+)   Peds negative pediatric ROS (+)  Hematology negative hematology ROS (+)   Anesthesia Other Findings Past Medical History: No date: Anxiety No date: Arthritis No date: GERD (gastroesophageal reflux disease) No date: Hyperlipidemia No date: Hypothyroid  Past Surgical History: No date: APPENDECTOMY No date: COLONOSCOPY 07/27/2020: ESOPHAGOGASTRODUODENOSCOPY (EGD) WITH PROPOFOL; N/A     Comment:  Procedure: ESOPHAGOGASTRODUODENOSCOPY (EGD) WITH               PROPOFOL;  Surgeon: Lucilla Lame, MD;  Location: [place];  Service: Endoscopy;  Laterality: N/A; 02/2016: JOINT REPLACEMENT; Left     Comment:  partial knee No date: TONSILLECTOMY No date: TUBAL LIGATION  BMI    Body Mass Index: 30.90 kg/m      Reproductive/Obstetrics negative OB ROS                             Anesthesia Physical Anesthesia Plan  ASA: 2  Anesthesia Plan: General   Post-op Pain Management:    Induction: Intravenous  PONV Risk Score and Plan: Ondansetron, Dexamethasone, Midazolam and Treatment may vary due to  age or medical condition  Airway Management Planned: Oral ETT  Additional Equipment:   Intra-op Plan:   Post-operative Plan: Extubation in OR  Informed Consent: I have reviewed the patients History and Physical, chart, labs and discussed the procedure including the risks, benefits and alternatives for the proposed anesthesia with the patient or authorized representative who has indicated his/her understanding and acceptance.     Dental Advisory Given  Plan Discussed with: CRNA and Surgeon  Anesthesia Plan Comments:        Anesthesia Quick Evaluation

## 2022-09-27 LAB — SURGICAL PATHOLOGY

## 2022-10-10 ENCOUNTER — Encounter: Payer: Self-pay | Admitting: Physician Assistant

## 2022-10-10 ENCOUNTER — Encounter: Payer: Medicare Other | Admitting: Physician Assistant

## 2022-10-10 ENCOUNTER — Ambulatory Visit (INDEPENDENT_AMBULATORY_CARE_PROVIDER_SITE_OTHER): Payer: Medicare Other | Admitting: Physician Assistant

## 2022-10-10 VITALS — BP 126/68 | HR 76 | Temp 97.5°F | Ht 64.0 in | Wt 190.2 lb

## 2022-10-10 DIAGNOSIS — K8020 Calculus of gallbladder without cholecystitis without obstruction: Secondary | ICD-10-CM

## 2022-10-10 DIAGNOSIS — Z09 Encounter for follow-up examination after completed treatment for conditions other than malignant neoplasm: Secondary | ICD-10-CM

## 2022-10-10 NOTE — Progress Notes (Signed)
[facility] POST-OP OFFICE VISIT  10/10/2022  HPI: Martha Walsh is a 72 y.o. female 14 days s/p robotic assisted laparoscopic cholecystectomy for symptomatic cholelithiasis with Dr Hampton Abbot   She has done very well Minimal discomfort at umbilicus No fever, chills, nausea, emesis, or bowel changes Tolerating PO; slowly re-introducing fats No issues with incisions No other complaints   Vital signs: BP 126/68   Pulse 76   Temp (!) 97.5 F (36.4 C) (Oral)   Ht '5\' 4"'$  (1.626 m)   Wt 190 lb 3.2 oz (86.3 kg)   SpO2 96%   BMI 32.65 kg/m    Physical Exam: Constitutional: Well appearing female, NAD Abdomen: Soft, non-tender, non-distended, no rebound/guarding Skin: Laparoscopic incisions are healing well, no erythema or drainage   Assessment/Plan: This is a 72 y.o. female 14 days s/p robotic assisted laparoscopic cholecystectomy for symptomatic cholelithiasis with Dr Hampton Abbot    - Pain control prn  - Reviewed wound care recommendation  - Reviewed lifting restrictions; 4 weeks total  - Reviewed surgical pathology; [place]  - She can follow up on as needed basis; She understands to call with questions/concerns  -- Edison Simon, PA-C [facility] 10/10/2022, 3:02 PM M-F: 7am - 4pm

## 2022-10-10 NOTE — Patient Instructions (Signed)

## 2022-10-23 DIAGNOSIS — H2512 Age-related nuclear cataract, left eye: Secondary | ICD-10-CM | POA: Diagnosis not present

## 2022-11-04 ENCOUNTER — Ambulatory Visit (INDEPENDENT_AMBULATORY_CARE_PROVIDER_SITE_OTHER): Payer: Medicare Other | Admitting: Internal Medicine

## 2022-11-04 ENCOUNTER — Encounter: Payer: Self-pay | Admitting: Internal Medicine

## 2022-11-04 VITALS — BP 128/66 | HR 65 | Ht 64.0 in | Wt 188.4 lb

## 2022-11-04 DIAGNOSIS — E039 Hypothyroidism, unspecified: Secondary | ICD-10-CM | POA: Diagnosis not present

## 2022-11-04 DIAGNOSIS — N1831 Chronic kidney disease, stage 3a: Secondary | ICD-10-CM

## 2022-11-04 DIAGNOSIS — M1711 Unilateral primary osteoarthritis, right knee: Secondary | ICD-10-CM

## 2022-11-04 DIAGNOSIS — D631 Anemia in chronic kidney disease: Secondary | ICD-10-CM | POA: Insufficient documentation

## 2022-11-04 NOTE — Progress Notes (Signed)
Date:  11/04/2022   Name:  Martha Walsh   DOB:  1950-12-30   MRN:  [id number]   Chief Complaint: Hypothyroidism  Thyroid Problem Presents for follow-up visit. Patient reports no anxiety, constipation, depressed mood, diarrhea, fatigue, hair loss, leg swelling, palpitations or weight gain. The symptoms have been stable.   CKD - mild decrease in GFR noted since 2021.  Patient advised to avoid NSAIDS and take tylenol instead.  She takes Advil intermittently - has reduced it recently.  Lab Results  Component Value Date   NA 140 05/21/2022   K 4.5 05/21/2022   CO2 22 05/21/2022   GLUCOSE 86 05/21/2022   BUN 18 05/21/2022   CREATININE 1.11 (H) 05/21/2022   CALCIUM 9.4 05/21/2022   EGFR 52 (L) 05/21/2022   GFRNONAA 56 04/18/2020   Lab Results  Component Value Date   CHOL 235 (H) 05/06/2022   HDL 66 05/06/2022   LDLCALC 146 (H) 05/06/2022   TRIG 133 05/06/2022   CHOLHDL 3.6 05/06/2022   Lab Results  Component Value Date   TSH 5.950 (H) 05/06/2022   No results found for: "HGBA1C" Lab Results  Component Value Date   WBC 5.7 05/21/2022   HGB 12.1 05/21/2022   HCT 36.3 05/21/2022   MCV 87 05/21/2022   PLT 212 05/21/2022   Lab Results  Component Value Date   ALT 13 05/21/2022   AST 18 05/21/2022   ALKPHOS 69 05/21/2022   BILITOT 0.4 05/21/2022   No results found for: "25OHVITD2", "25OHVITD3", "VD25OH"   Review of Systems  Constitutional:  Negative for fatigue, unexpected weight change and weight gain.  HENT:  Negative for nosebleeds.   Eyes:  Negative for visual disturbance.  Respiratory:  Negative for cough, chest tightness, shortness of breath and wheezing.   Cardiovascular:  Negative for chest pain, palpitations and leg swelling.  Gastrointestinal:  Negative for abdominal pain, constipation and diarrhea.  Musculoskeletal:  Positive for arthralgias and gait problem.  Neurological:  Negative for dizziness, weakness, light-headedness and headaches.   Psychiatric/Behavioral:  Negative for dysphoric mood and sleep disturbance. The patient is not nervous/anxious.     Patient Active Problem List   Diagnosis Date Noted   Chronic kidney disease, stage 3a ([place]) 11/04/2022   Low back pain 10/05/2021   History of nonmelanoma skin cancer 12/04/2020   Primary osteoarthritis of right knee 12/01/2020   Murmur 01/03/2020   Generalized anxiety disorder 06/04/2019   Status post left partial knee replacement 03/12/2016   Acquired hypothyroidism 02/07/2016   Atypical mole 11/22/2015   Gastro-esophageal reflux disease without esophagitis 08/04/2015   Nocturnal leg cramps 11/22/2013   Obesity (BMI 30-39.9) 11/22/2013   Mixed hyperlipidemia 09/28/2013    Allergies  Allergen Reactions   Augmentin [Amoxicillin-Pot Clavulanate] Nausea Only    Abdominal Pain    Wound Dressing Adhesive Itching and Rash    "Beet red" at dressing site after knee replacement   Lisinopril Cough         Past Surgical History:  Procedure Laterality Date   APPENDECTOMY     CHOLECYSTECTOMY  09/2022   COLONOSCOPY     ESOPHAGOGASTRODUODENOSCOPY (EGD) WITH PROPOFOL N/A 07/27/2020   Procedure: ESOPHAGOGASTRODUODENOSCOPY (EGD) WITH PROPOFOL;  Surgeon: Lucilla Lame, MD;  Location: [facility];  Service: Endoscopy;  Laterality: N/A;   JOINT REPLACEMENT Left 02/2016   partial knee   TONSILLECTOMY     TUBAL LIGATION      Social History   Tobacco Use  Smoking status: Former    Types: Cigarettes    Quit date: 09/23/1998    Years since quitting: 24.1    Passive exposure: Past   Smokeless tobacco: Never  Vaping Use   Vaping Use: Never used  Substance Use Topics   Alcohol use: Yes    Alcohol/week: 7.0 standard drinks of alcohol    Types: 7 Glasses of wine per week    Comment: occasionally   Drug use: Never     Medication list has been reviewed and updated.  Current Meds  Medication Sig   acetaminophen (TYLENOL) 500 MG tablet Take 2 tablets (1,000 mg  total) by mouth every 6 (six) hours as needed for mild pain.   amoxicillin (AMOXIL) 500 MG tablet TAKE 4 TABLETS BY MOUTH (2 GRAMS) 60 MINUTES BEFORE DENTAL PROCEDURE   Calcium Carbonate-Vitamin D 600-400 MG-UNIT tablet Take 1 tablet by mouth daily.   Cholecalciferol 25 MCG (1000 UT) tablet Take 1,000 Units by mouth daily.   citalopram (CELEXA) 10 MG tablet Take 1 tablet (10 mg total) by mouth daily. (Patient taking differently: Take 10 mg by mouth at bedtime.)   clonazePAM (KLONOPIN) 0.5 MG tablet Take 0.5 mg by mouth as needed for anxiety.   cyclobenzaprine (FLEXERIL) 5 MG tablet Take 5 mg by mouth as needed.   ezetimibe (ZETIA) 10 MG tablet Take 1 tablet (10 mg total) by mouth daily. (Patient taking differently: Take 10 mg by mouth at bedtime.)   ibuprofen (ADVIL) 800 MG tablet Take 1 tablet (800 mg total) by mouth every 8 (eight) hours as needed for moderate pain.   levothyroxine (SYNTHROID) 50 MCG tablet TAKE 1 TABLET BY MOUTH ONCE DAILY BEFORE BREAKFAST   pantoprazole (PROTONIX) 40 MG tablet Take 1 tablet (40 mg total) by mouth daily.   potassium chloride (KLOR-CON) 10 MEQ tablet Take 10 mEq by mouth at bedtime. Every other day for leg cramps OTC   pravastatin (PRAVACHOL) 80 MG tablet Take 1 tablet (80 mg total) by mouth daily. (Patient taking differently: Take 80 mg by mouth at bedtime.)   [DISCONTINUED] ibuprofen (ADVIL) 800 MG tablet Take 800 mg by mouth 3 (three) times daily. PRN       11/04/2022    9:26 AM 09/10/2022   10:33 AM 05/21/2022    2:11 PM 05/06/2022    8:50 AM  GAD 7 : Generalized Anxiety Score  Nervous, Anxious, on Edge 0 0 0 0  Control/stop worrying 0 0 0 0  Worry too much - different things 0 0 0 0  Trouble relaxing 0 0 0 0  Restless 0 0 0 0  Easily annoyed or irritable 0 0 0 0  Afraid - awful might happen 0 0 0 0  Total GAD 7 Score 0 0 0 0  Anxiety Difficulty Not difficult at all Not difficult at all Not difficult at all Not difficult at all       11/04/2022     9:26 AM 09/10/2022   10:32 AM 05/21/2022    2:11 PM  Depression screen PHQ 2/9  Decreased Interest 0 0 0  Down, Depressed, Hopeless 0 0 0  PHQ - 2 Score 0 0 0  Altered sleeping 0 0 0  Tired, decreased energy 0 0 0  Change in appetite 0 0 0  Feeling bad or failure about yourself  0 0 0  Trouble concentrating 0 0 0  Moving slowly or fidgety/restless 0 0 0  Suicidal thoughts 0 0 0  PHQ-9 Score 0  0 0  Difficult doing work/chores Not difficult at all Not difficult at all Not difficult at all    BP Readings from Last 3 Encounters:  11/04/22 128/66  10/10/22 126/68  09/26/22 121/82    Physical Exam Vitals and nursing note reviewed.  Constitutional:      General: She is not in acute distress.    Appearance: She is well-developed.  HENT:     Head: Normocephalic and atraumatic.  Neck:     Vascular: No carotid bruit.  Cardiovascular:     Rate and Rhythm: Normal rate and regular rhythm.     Pulses: Normal pulses.     Heart sounds: No murmur heard. Pulmonary:     Effort: Pulmonary effort is normal. No respiratory distress.     Breath sounds: No wheezing or rhonchi.  Musculoskeletal:     Cervical back: Normal range of motion.     Right lower leg: No edema.     Left lower leg: No edema.  Lymphadenopathy:     Cervical: No cervical adenopathy.  Skin:    General: Skin is warm and dry.     Findings: No rash.  Neurological:     General: No focal deficit present.     Mental Status: She is alert and oriented to person, place, and time.  Psychiatric:        Mood and Affect: Mood normal.        Behavior: Behavior normal.     Wt Readings from Last 3 Encounters:  11/04/22 188 lb 6.4 oz (85.5 kg)  10/10/22 190 lb 3.2 oz (86.3 kg)  09/26/22 180 lb (81.6 kg)    BP 128/66   Pulse 65   Ht 5' 4"$  (1.626 m)   Wt 188 lb 6.4 oz (85.5 kg)   SpO2 96%   BMI 32.34 kg/m   Assessment and Plan: Problem List Items Addressed This Visit       Endocrine   Acquired hypothyroidism -  Primary (Chronic)    Supplemented No symptoms to suggest poor control Last TSH 5.9 with T4 1.24 - dose unchanged      Relevant Orders   TSH + free T4     Musculoskeletal and Integument   Primary osteoarthritis of right knee    Recommend limiting use of Advil due to mild CKD Currently taking 800 mg once a day; tylenol of no benefit        Genitourinary   Chronic kidney disease, stage 3a ([facility]) (Chronic)    GFR in the 50's since 2021 Advised to avoid NSAIDS      Relevant Orders   Basic metabolic panel     Partially dictated using Dragon software. Any errors are unintentional.  Halina Maidens, MD [facility]  11/04/2022

## 2022-11-04 NOTE — Assessment & Plan Note (Signed)
GFR in the 50's since 2021 Advised to avoid NSAIDS

## 2022-11-04 NOTE — Assessment & Plan Note (Signed)
Recommend limiting use of Advil due to mild CKD Currently taking 800 mg once a day; tylenol of no benefit

## 2022-11-04 NOTE — Assessment & Plan Note (Signed)
Supplemented No symptoms to suggest poor control Last TSH 5.9 with T4 1.24 - dose unchanged

## 2022-11-05 DIAGNOSIS — M9903 Segmental and somatic dysfunction of lumbar region: Secondary | ICD-10-CM | POA: Diagnosis not present

## 2022-11-05 DIAGNOSIS — M4306 Spondylolysis, lumbar region: Secondary | ICD-10-CM | POA: Diagnosis not present

## 2022-11-05 DIAGNOSIS — M5416 Radiculopathy, lumbar region: Secondary | ICD-10-CM | POA: Diagnosis not present

## 2022-11-05 DIAGNOSIS — M9905 Segmental and somatic dysfunction of pelvic region: Secondary | ICD-10-CM | POA: Diagnosis not present

## 2022-11-05 LAB — BASIC METABOLIC PANEL
BUN/Creatinine Ratio: 21 (ref 12–28)
BUN: 21 mg/dL (ref 8–27)
CO2: 20 mmol/L (ref 20–29)
Calcium: 9.3 mg/dL (ref 8.7–10.3)
Chloride: 105 mmol/L (ref 96–106)
Creatinine, Ser: 1.02 mg/dL — ABNORMAL HIGH (ref 0.57–1.00)
Glucose: 98 mg/dL (ref 70–99)
Potassium: 4.3 mmol/L (ref 3.5–5.2)
Sodium: 139 mmol/L (ref 134–144)
eGFR: 58 mL/min/{1.73_m2} — ABNORMAL LOW (ref >59)

## 2022-11-05 LAB — TSH+FREE T4
Free T4: 1.33 ng/dL (ref 0.82–1.77)
TSH: 5.82 u[IU]/mL — ABNORMAL HIGH (ref 0.450–4.500)

## 2022-11-06 ENCOUNTER — Other Ambulatory Visit: Payer: Self-pay

## 2022-11-06 MED ORDER — LEVOTHYROXINE SODIUM 50 MCG PO TABS: 50.0000 ug | ORAL_TABLET | Freq: Every day | ORAL | 0 refills | Status: DC

## 2022-11-14 ENCOUNTER — Encounter: Payer: Self-pay | Admitting: Ophthalmology

## 2022-11-14 NOTE — Discharge Instructions (Signed)

## 2022-11-18 ENCOUNTER — Ambulatory Visit
Admission: RE | Admit: 2022-11-18 | Discharge: 2022-11-18 | Disposition: A | Discharge status: Home / Self Care | Payer: Medicare Other | Attending: Ophthalmology | Admitting: Ophthalmology

## 2022-11-18 ENCOUNTER — Ambulatory Visit: Payer: Medicare Other | Admitting: General Practice

## 2022-11-18 ENCOUNTER — Encounter: Payer: Self-pay | Admitting: Ophthalmology

## 2022-11-18 ENCOUNTER — Encounter
Admission: RE | Disposition: A | Discharge status: Home / Self Care | Payer: Self-pay | Source: Home / Self Care | Attending: Ophthalmology

## 2022-11-18 ENCOUNTER — Other Ambulatory Visit: Payer: Self-pay

## 2022-11-18 DIAGNOSIS — I129 Hypertensive chronic kidney disease with stage 1 through stage 4 chronic kidney disease, or unspecified chronic kidney disease: Secondary | ICD-10-CM | POA: Diagnosis not present

## 2022-11-18 DIAGNOSIS — E1136 Type 2 diabetes mellitus with diabetic cataract: Secondary | ICD-10-CM | POA: Diagnosis not present

## 2022-11-18 DIAGNOSIS — K219 Gastro-esophageal reflux disease without esophagitis: Secondary | ICD-10-CM | POA: Diagnosis not present

## 2022-11-18 DIAGNOSIS — E039 Hypothyroidism, unspecified: Secondary | ICD-10-CM | POA: Diagnosis not present

## 2022-11-18 DIAGNOSIS — I10 Essential (primary) hypertension: Secondary | ICD-10-CM | POA: Diagnosis not present

## 2022-11-18 DIAGNOSIS — Z87891 Personal history of nicotine dependence: Secondary | ICD-10-CM | POA: Diagnosis not present

## 2022-11-18 DIAGNOSIS — Z79899 Other long term (current) drug therapy: Secondary | ICD-10-CM | POA: Diagnosis not present

## 2022-11-18 DIAGNOSIS — E785 Hyperlipidemia, unspecified: Secondary | ICD-10-CM | POA: Diagnosis not present

## 2022-11-18 DIAGNOSIS — E1122 Type 2 diabetes mellitus with diabetic chronic kidney disease: Secondary | ICD-10-CM | POA: Diagnosis not present

## 2022-11-18 DIAGNOSIS — N1831 Chronic kidney disease, stage 3a: Secondary | ICD-10-CM | POA: Diagnosis not present

## 2022-11-18 DIAGNOSIS — F419 Anxiety disorder, unspecified: Secondary | ICD-10-CM | POA: Diagnosis not present

## 2022-11-18 DIAGNOSIS — H2512 Age-related nuclear cataract, left eye: Secondary | ICD-10-CM | POA: Insufficient documentation

## 2022-11-18 HISTORY — DX: Motion sickness, initial encounter: T75.3XXA

## 2022-11-18 HISTORY — PX: CATARACT EXTRACTION W/PHACO: SHX586

## 2022-11-18 SURGERY — PHACOEMULSIFICATION, CATARACT, WITH IOL INSERTION
Anesthesia: Topical | Site: Eye | Laterality: Left

## 2022-11-18 MED ORDER — MOXIFLOXACIN HCL 0.5 % OP SOLN
OPHTHALMIC | Status: DC | PRN
  Administered 2022-11-18: .2 mL via OPHTHALMIC

## 2022-11-18 MED ORDER — MIDAZOLAM HCL 2 MG/2ML IJ SOLN
INTRAMUSCULAR | Status: DC | PRN
  Administered 2022-11-18: 1 mg via INTRAVENOUS

## 2022-11-18 MED ORDER — SIGHTPATH DOSE#1 BSS IO SOLN
INTRAOCULAR | Status: DC | PRN
  Administered 2022-11-18: 15 mL

## 2022-11-18 MED ORDER — SIGHTPATH DOSE#1 NA HYALUR & NA CHOND-NA HYALUR IO KIT
PACK | INTRAOCULAR | Status: DC | PRN
  Administered 2022-11-18: 1 via OPHTHALMIC

## 2022-11-18 MED ORDER — ONDANSETRON HCL 4 MG/2ML IJ SOLN: 4.0000 mg | Freq: Once | INTRAMUSCULAR | Status: DC | PRN

## 2022-11-18 MED ORDER — FENTANYL CITRATE (PF) 100 MCG/2ML IJ SOLN
INTRAMUSCULAR | Status: DC | PRN
  Administered 2022-11-18: 50 ug via INTRAVENOUS

## 2022-11-18 MED ORDER — FENTANYL CITRATE PF 50 MCG/ML IJ SOSY: 25.0000 ug | PREFILLED_SYRINGE | INTRAMUSCULAR | Status: DC | PRN

## 2022-11-18 MED ORDER — TETRACAINE HCL 0.5 % OP SOLN
1.0000 [drp] | OPHTHALMIC | Status: DC | PRN
  Administered 2022-11-18 (×3): 1 [drp] via OPHTHALMIC

## 2022-11-18 MED ORDER — LIDOCAINE HCL (PF) 2 % IJ SOLN
INTRAOCULAR | Status: DC | PRN
  Administered 2022-11-18: 1 mL via INTRAOCULAR

## 2022-11-18 MED ORDER — SIGHTPATH DOSE#1 BSS IO SOLN
INTRAOCULAR | Status: DC | PRN
  Administered 2022-11-18: 104 mL via OPHTHALMIC

## 2022-11-18 MED ORDER — ARMC OPHTHALMIC DILATING DROPS
1.0000 | OPHTHALMIC | Status: DC | PRN
  Administered 2022-11-18 (×3): 1 via OPHTHALMIC

## 2022-11-18 SURGICAL SUPPLY — 13 items
CATARACT SUITE SIGHTPATH (MISCELLANEOUS) ×1 IMPLANT
DISSECTOR HYDRO NUCLEUS 50X22 (MISCELLANEOUS) ×1 IMPLANT
FEE CATARACT SUITE SIGHTPATH (MISCELLANEOUS) ×1 IMPLANT
GLOVE SURG GAMMEX PI TX LF 7.5 (GLOVE) ×1 IMPLANT
GLOVE SURG SYN 8.5  E (GLOVE) ×1
GLOVE SURG SYN 8.5 E (GLOVE) ×1 IMPLANT
GLOVE SURG SYN 8.5 PF PI (GLOVE) ×1 IMPLANT
LENS IOL TECNIS EYHANCE 22.5 (Intraocular Lens) IMPLANT
NDL FILTER BLUNT 18X1 1/2 (NEEDLE) ×1 IMPLANT
NEEDLE FILTER BLUNT 18X1 1/2 (NEEDLE) ×1 IMPLANT
SYR 3ML LL SCALE MARK (SYRINGE) ×1 IMPLANT
SYR 5ML LL (SYRINGE) ×1 IMPLANT
WATER STERILE IRR 250ML POUR (IV SOLUTION) ×1 IMPLANT

## 2022-11-18 NOTE — Anesthesia Preprocedure Evaluation (Signed)
Anesthesia Evaluation  Patient identified by MRN, date of birth, ID band Patient awake    Reviewed: Allergy & Precautions, H&P , NPO status , Patient's Chart, lab work & pertinent test results, reviewed documented beta blocker date and time   Airway Mallampati: II  TM Distance: >3 FB Neck ROM: full    Dental no notable dental hx. (+) Teeth Intact   Pulmonary neg pulmonary ROS, former smoker   Pulmonary exam normal breath sounds clear to auscultation       Cardiovascular Exercise Tolerance: Poor hypertension, On Medications + Valvular Problems/Murmurs  Rhythm:regular Rate:Normal     Neuro/Psych   Anxiety     negative neurological ROS  negative psych ROS   GI/Hepatic Neg liver ROS,GERD  Medicated,,  Endo/Other  diabetesHypothyroidism    Renal/GU Renal disease     Musculoskeletal   Abdominal   Peds  Hematology negative hematology ROS (+)   Anesthesia Other Findings   Reproductive/Obstetrics negative OB ROS                             Anesthesia Physical Anesthesia Plan  ASA: 3  Anesthesia Plan: MAC   Post-op Pain Management:    Induction:   PONV Risk Score and Plan:   Airway Management Planned:   Additional Equipment:   Intra-op Plan:   Post-operative Plan:   Informed Consent: I have reviewed the patients History and Physical, chart, labs and discussed the procedure including the risks, benefits and alternatives for the proposed anesthesia with the patient or authorized representative who has indicated his/her understanding and acceptance.       Plan Discussed with: CRNA  Anesthesia Plan Comments:        Anesthesia Quick Evaluation

## 2022-11-18 NOTE — Anesthesia Postprocedure Evaluation (Signed)
Anesthesia Post Note  Patient: Martha Walsh  Procedure(s) Performed: CATARACT EXTRACTION PHACO AND INTRAOCULAR LENS PLACEMENT (IOC) LEFT  10.20  00:55.0 (Left: Eye)  Patient location during evaluation: PACU Anesthesia Type: MAC Level of consciousness: awake and alert Pain management: pain level controlled Vital Signs Assessment: post-procedure vital signs reviewed and stable Respiratory status: spontaneous breathing, nonlabored ventilation, respiratory function stable and patient connected to nasal cannula oxygen Cardiovascular status: stable and blood pressure returned to baseline Postop Assessment: no apparent nausea or vomiting Anesthetic complications: no   No notable events documented.   Last Vitals:  Vitals:   11/18/22 1005 11/18/22 1137  BP: 137/72 (!) 150/78  Pulse: 65 63  Resp: 10 18  Temp: 36.7 C (!) 36.4 C  SpO2: 96% 100%    Last Pain:  Vitals:   11/18/22 1137  TempSrc:   PainSc: 0-No pain                 Molli Barrows

## 2022-11-18 NOTE — Op Note (Signed)
OPERATIVE NOTE  Martha Walsh [id number] 11/18/2022   PREOPERATIVE DIAGNOSIS:  Nuclear sclerotic cataract left eye.  H25.12   POSTOPERATIVE DIAGNOSIS:    Nuclear sclerotic cataract left eye.     PROCEDURE:  Phacoemusification with posterior chamber intraocular lens placement of the left eye   LENS:   Implant Name Type Inv. Item Serial No. Manufacturer Lot No. LRB No. Used Action  LENS IOL TECNIS EYHANCE 22.5 - [id number] Intraocular Lens LENS IOL TECNIS EYHANCE 22.5 [id number] [employer]  Left 1 Implanted      Procedure(s): CATARACT EXTRACTION PHACO AND INTRAOCULAR LENS PLACEMENT (IOC) LEFT  10.20  00:55.0 (Left)  DIB00 +22.5   ULTRASOUND TIME: 0 minutes 55 seconds.  CDE 10.20   SURGEON:  Benay Pillow, MD, MPH   ANESTHESIA:  Topical with tetracaine drops augmented with 1% preservative-free intracameral lidocaine.  ESTIMATED BLOOD LOSS: <1 mL   COMPLICATIONS:  None.   DESCRIPTION OF PROCEDURE:  The patient was identified in the holding room and transported to the operating room and placed in the supine position under the operating microscope.  The left eye was identified as the operative eye and it was prepped and draped in the usual sterile ophthalmic fashion.   A 1.0 millimeter clear-corneal paracentesis was made at the 5:00 position. 0.5 ml of preservative-free 1% lidocaine with epinephrine was injected into the anterior chamber.  The anterior chamber was filled with viscoelastic.  A 2.4 millimeter keratome was used to make a near-clear corneal incision at the 2:00 position.  A curvilinear capsulorrhexis was made with a cystotome and capsulorrhexis forceps.  Balanced salt solution was used to hydrodissect and hydrodelineate the nucleus.   Phacoemulsification was then used in stop and chop fashion to remove the lens nucleus and epinucleus.  The remaining cortex was then removed using the irrigation and aspiration handpiece. Viscoelastic was then placed into the capsular bag to  distend it for lens placement.  A lens was then injected into the capsular bag.  The remaining viscoelastic was aspirated.   Wounds were hydrated with balanced salt solution.  The anterior chamber was inflated to a physiologic pressure with balanced salt solution.  Intracameral vigamox 0.1 mL undiltued was injected into the eye and a drop placed onto the ocular surface.  No wound leaks were noted.  The patient was taken to the recovery room in stable condition without complications of anesthesia or surgery  Benay Pillow 11/18/2022, 11:37 AM

## 2022-11-18 NOTE — H&P (Signed)
[facility]   Primary Care Physician:  Glean Hess, MD Ophthalmologist: Dr. Benay Pillow  Pre-Procedure History & Physical: HPI:  Martha Walsh is a 72 y.o. female here for cataract surgery.   Past Medical History:  Diagnosis Date   Anxiety    Arthritis    GERD (gastroesophageal reflux disease)    Hyperlipidemia    Hypothyroid    Motion sickness    Boats    Past Surgical History:  Procedure Laterality Date   APPENDECTOMY     CHOLECYSTECTOMY  09/2022   COLONOSCOPY     ESOPHAGOGASTRODUODENOSCOPY (EGD) WITH PROPOFOL N/A 07/27/2020   Procedure: ESOPHAGOGASTRODUODENOSCOPY (EGD) WITH PROPOFOL;  Surgeon: Lucilla Lame, MD;  Location: [place];  Service: Endoscopy;  Laterality: N/A;   JOINT REPLACEMENT Left 02/2016   partial knee   TONSILLECTOMY     TUBAL LIGATION      Prior to Admission medications   Medication Sig Start Date End Date Taking? Authorizing Provider  acetaminophen (TYLENOL) 500 MG tablet Take 2 tablets (1,000 mg total) by mouth every 6 (six) hours as needed for mild pain. 09/26/22  Yes Piscoya, Jacqulyn Bath, MD  Calcium Carbonate-Vitamin D 600-400 MG-UNIT tablet Take 1 tablet by mouth daily. 09/29/12  Yes [provider]  Cholecalciferol 25 MCG (1000 UT) tablet Take 1,000 Units by mouth daily.   Yes [provider]  citalopram (CELEXA) 10 MG tablet Take 1 tablet (10 mg total) by mouth daily. Patient taking differently: Take 10 mg by mouth at bedtime. 05/06/22  Yes Glean Hess, MD  clonazePAM (KLONOPIN) 0.5 MG tablet Take 0.5 mg by mouth as needed for anxiety. 03/03/19  Yes [provider]  cyclobenzaprine (FLEXERIL) 5 MG tablet Take 5 mg by mouth as needed. 11/03/20  Yes [provider]  ezetimibe (ZETIA) 10 MG tablet Take 1 tablet (10 mg total) by mouth daily. Patient taking differently: Take 10 mg by mouth at bedtime. 05/06/22  Yes Glean Hess, MD  ibuprofen (ADVIL) 800 MG tablet Take 1 tablet (800 mg total)  by mouth every 8 (eight) hours as needed for moderate pain. 09/26/22  Yes Piscoya, Jacqulyn Bath, MD  levothyroxine (SYNTHROID) 50 MCG tablet Take 1 tablet (50 mcg total) by mouth daily before breakfast. 11/06/22  Yes Glean Hess, MD  Omega-3 Fatty Acids (FISH OIL PO) Take by mouth.   Yes [provider]  pantoprazole (PROTONIX) 40 MG tablet Take 1 tablet (40 mg total) by mouth daily. 12/24/21  Yes Lucilla Lame, MD  potassium chloride (KLOR-CON) 10 MEQ tablet Take 10 mEq by mouth at bedtime. Every other day for leg cramps OTC   Yes [provider]  pravastatin (PRAVACHOL) 80 MG tablet Take 1 tablet (80 mg total) by mouth daily. Patient taking differently: Take 80 mg by mouth at bedtime. 05/06/22  Yes Glean Hess, MD  amoxicillin (AMOXIL) 500 MG tablet TAKE 4 TABLETS BY MOUTH (2 GRAMS) 98 MINUTES BEFORE DENTAL PROCEDURE    [provider]    Allergies as of 10/15/2022 - Review Complete 10/10/2022  Allergen Reaction Noted   Augmentin [amoxicillin-pot clavulanate] Nausea Only 11/22/2013   Wound dressing adhesive Itching and Rash 09/18/2022   Lisinopril Cough 09/10/2016    Family History  Problem Relation Age of Onset   Heart disease Father     Social History   Socioeconomic History   Marital status: Married    Spouse name: Not on file   Number of children: 3   Years  of education: Not on file   Highest education level: Associate degree: academic program  Occupational History   Occupation: retired  Tobacco Use   Smoking status: Former    Types: Cigarettes    Quit date: 09/23/1998    Years since quitting: 24.1    Passive exposure: Past   Smokeless tobacco: Never  Vaping Use   Vaping Use: Never used  Substance and Sexual Activity   Alcohol use: Yes    Alcohol/week: 7.0 standard drinks of alcohol    Types: 7 Glasses of wine per week    Comment: occasionally   Drug use: Never   Sexual activity: Not Currently  Other Topics Concern   Not on file  Social  History Narrative   Not on file   Social Determinants of Health   Financial Resource Strain: Low Risk  (03/06/2022)   Overall Financial Resource Strain (CARDIA)    Difficulty of Paying Living Expenses: Not hard at all  Food Insecurity: No Food Insecurity (03/06/2022)   Hunger Vital Sign    Worried About Running Out of Food in the Last Year: Never true    Ran Out of Food in the Last Year: Never true  Transportation Needs: No Transportation Needs (03/06/2022)   PRAPARE - Hydrologist (Medical): No    Lack of Transportation (Non-Medical): No  Physical Activity: Inactive (03/06/2022)   Exercise Vital Sign    Days of Exercise per Week: 0 days    Minutes of Exercise per Session: 0 min  Stress: No Stress Concern Present (03/06/2022)   [place]    Feeling of Stress : Not at all  Social Connections: [place] (03/06/2022)   Social Connection and Isolation Panel [NHANES]    Frequency of Communication with Friends and Family: More than three times a week    Frequency of Social Gatherings with Friends and Family: More than three times a week    Attends Religious Services: More than 4 times per year    Active Member of [employer] or Organizations: Yes    Attends Music therapist: More than 4 times per year    Marital Status: Married  Human resources officer Violence: Not At Risk (03/06/2022)   Humiliation, Afraid, Rape, and Kick questionnaire    Fear of Current or Ex-Partner: No    Emotionally Abused: No    Physically Abused: No    Sexually Abused: No    Review of Systems: See HPI, otherwise negative ROS  Physical Exam: BP 137/72   Pulse 65   Temp 98.1 F (36.7 C) (Temporal)   Resp 10   Ht '5\' 4"'$  (1.626 m)   Wt 85.3 kg   SpO2 96%   BMI 32.27 kg/m  General:   Alert, cooperative in NAD Head:  Normocephalic and atraumatic. Respiratory:  Normal work of breathing. Cardiovascular:   RRR  Impression/Plan: Martha Walsh is here for cataract surgery.  Risks, benefits, limitations, and alternatives regarding cataract surgery have been reviewed with the patient.  Questions have been answered.  All parties agreeable.   Benay Pillow, MD  11/18/2022, 11:10 AM'

## 2022-11-18 NOTE — Transfer of Care (Signed)
Immediate Anesthesia Transfer of Care Note  Patient: Martha Walsh  Procedure(s) Performed: CATARACT EXTRACTION PHACO AND INTRAOCULAR LENS PLACEMENT (IOC) LEFT  10.20  00:55.0 (Left: Eye)  Patient Location: PACU  Anesthesia Type: No value filed.  Level of Consciousness: awake, alert  and patient cooperative  Airway and Oxygen Therapy: Patient Spontanous Breathing and Patient connected to supplemental oxygen  Post-op Assessment: Post-op Vital signs reviewed, Patient's Cardiovascular Status Stable, Respiratory Function Stable, Patent Airway and No signs of Nausea or vomiting  Post-op Vital Signs: Reviewed and stable  Complications: No notable events documented.

## 2022-11-19 ENCOUNTER — Encounter: Payer: Self-pay | Admitting: Ophthalmology

## 2022-11-19 ENCOUNTER — Other Ambulatory Visit: Payer: Self-pay

## 2022-11-19 DIAGNOSIS — H2511 Age-related nuclear cataract, right eye: Secondary | ICD-10-CM | POA: Diagnosis not present

## 2022-11-25 ENCOUNTER — Emergency Department: Payer: Medicare Other

## 2022-11-25 ENCOUNTER — Encounter: Payer: Self-pay | Admitting: General Practice

## 2022-11-25 ENCOUNTER — Ambulatory Visit: Payer: Self-pay

## 2022-11-25 ENCOUNTER — Other Ambulatory Visit: Payer: Self-pay

## 2022-11-25 ENCOUNTER — Emergency Department
Admission: EM | Admit: 2022-11-25 | Discharge: 2022-11-25 | Disposition: A | Discharge status: Home / Self Care | Payer: Medicare Other | Attending: Emergency Medicine | Admitting: Emergency Medicine

## 2022-11-25 ENCOUNTER — Encounter: Payer: Self-pay | Admitting: Emergency Medicine

## 2022-11-25 DIAGNOSIS — E039 Hypothyroidism, unspecified: Secondary | ICD-10-CM | POA: Insufficient documentation

## 2022-11-25 DIAGNOSIS — R0789 Other chest pain: Secondary | ICD-10-CM | POA: Diagnosis not present

## 2022-11-25 DIAGNOSIS — R079 Chest pain, unspecified: Secondary | ICD-10-CM | POA: Diagnosis not present

## 2022-11-25 LAB — BASIC METABOLIC PANEL
Anion gap: 9 (ref 5–15)
BUN: 23 mg/dL (ref 8–23)
CO2: 25 mmol/L (ref 22–32)
Calcium: 9.2 mg/dL (ref 8.9–10.3)
Chloride: 105 mmol/L (ref 98–111)
Creatinine, Ser: 0.92 mg/dL (ref 0.44–1.00)
GFR, Estimated: >60 mL/min (ref >60)
Glucose, Bld: 105 mg/dL — ABNORMAL HIGH (ref 70–99)
Potassium: 4.2 mmol/L (ref 3.5–5.1)
Sodium: 139 mmol/L (ref 135–145)

## 2022-11-25 LAB — CBC
HCT: 34.1 % — ABNORMAL LOW (ref 36.0–46.0)
Hemoglobin: 11.2 g/dL — ABNORMAL LOW (ref 12.0–15.0)
MCH: 28.8 pg (ref 26.0–34.0)
MCHC: 32.8 g/dL (ref 30.0–36.0)
MCV: 87.7 fL (ref 80.0–100.0)
Platelets: 200 10*3/uL (ref 150–400)
RBC: 3.89 MIL/uL (ref 3.87–5.11)
RDW: 13.5 % (ref 11.5–15.5)
WBC: 5 10*3/uL (ref 4.0–10.5)
nRBC: 0 % (ref 0.0–0.2)

## 2022-11-25 LAB — TROPONIN I (HIGH SENSITIVITY)
Troponin I (High Sensitivity): 3 ng/L (ref <18)
Troponin I (High Sensitivity): 5 ng/L (ref <18)

## 2022-11-25 NOTE — Telephone Encounter (Signed)
  Chief Complaint: chest pain that radiates to shoulder blades Symptoms: now pain mild, still occurring, right arm tingling Frequency: yesterday Pertinent Negatives: Patient denies sweating,  Disposition: '[x]'$ ED /'[]'$ Urgent Care (no appt availability in office) / '[]'$ Appointment(In office/virtual)/ '[]'$  [facility]/ '[]'$ Home Care/ '[]'$ Refused Recommended Disposition /'[]'$ [facility]/ '[]'$  Follow-up with PCP Additional Notes: to ED Reason for Disposition  Pain also in shoulder(s) or arm(s) or jaw  (Exception: Pain is clearly made worse by movement.)  Answer Assessment - Initial Assessment Questions 1. LOCATION: "Where does it hurt?"       Middle of chest  2. RADIATION: "Does the pain go anywhere else?" (e.g., into neck, jaw, arms, back)     Shoulder blades 3. ONSET: "When did the chest pain begin?" (Minutes, hours or days)      yest 4. PATTERN: "Does the pain come and go, or has it been constant since it started?"  "Does it get worse with exertion?"      Comes and goes  5. DURATION: "How long does it last" (e.g., seconds, minutes, hours)     15  min 6. SEVERITY: "How bad is the pain?"  (e.g., Scale 1-10; mild, moderate, or severe)    - MILD (1-3): doesn't interfere with normal activities     - MODERATE (4-7): interferes with normal activities or awakens from sleep    - SEVERE (8-10): excruciating pain, unable to do any normal activities       mild 7. CARDIAC RISK FACTORS: "Do you have any history of heart problems or risk factors for heart disease?" (e.g., angina, prior heart attack; diabetes, high blood pressure, high cholesterol, smoker, or strong family history of heart disease)     High cholesterol , father with heart issues  9. CAUSE: "What do you think is causing the chest pain?"     unsure 10. OTHER SYMPTOMS: "Do you have any other symptoms?" (e.g., dizziness, nausea, vomiting, sweating, fever, difficulty breathing, cough)       nausea 11. PREGNANCY: "Is there any  chance you are pregnant?" "When was your last menstrual period?"       N/a  Protocols used: Chest Pain-A-AH

## 2022-11-25 NOTE — ED Notes (Addendum)
See triage note. Yesterday woke up with some heaviness then patient went to church patient started experiencing CP when standing started radiating down patients R arm for approximately 30 seconds and to the shoulder blades then just had chest pain on and off yesterday but last night felt fine and slept well. Today she felt fine when she woke up as far as chest pain but had some SOB on and off. Pain level for the chest pain has never been more than a 2. Took a couple baby asprin yesterday and one this morning. States it is a feeling of heaviness when it occurs. Denies cough, fevers,swelling in legs. Pt has had stress tests in the past 2020 being the last time but came back as anxiety.

## 2022-11-25 NOTE — ED Triage Notes (Signed)
Patient to ED via POV for chest pain. Patient states pain in mid chest with SOB- states pain in between shoulder blades. Pain intermittent. Denies cardiac history. States she took a baby aspirin today and yesterday.

## 2022-11-25 NOTE — ED Provider Notes (Signed)
[facility] Provider Note    Event Date/Time   First MD Initiated Contact with Patient 11/25/22 1109     (approximate)   History   Chief Complaint Chest Pain   HPI  Martha Walsh is a 72 y.o. female with past medical history of hyperlipidemia, GERD, hypothyroidism, and anxiety who presents to the ED complaining of chest pain.  Patient reports that she has been dealing with intermittent chest pain since yesterday morning while she was at church.  She describes it as a pressure that radiates to the area between her shoulder blades, along with a numbness and tingling going down her right arm.  Pain seems to come on for a few minutes at a time before resolving, does not seem to be exacerbated or alleviated by headache in particular and can occur at rest.  She states she will feel slightly short of breath with the pain but denies any shortness of breath currently.  She reports some mild pressure in her chest at this time, has eased up from earlier this morning.  She denies any recent fevers or cough and has not had any pain or swelling in her legs.  She states that she had similar symptoms in the past with a negative stress test in 2020.     Physical Exam   Triage Vital Signs: ED Triage Vitals  Enc Vitals Group     BP 11/25/22 1051 (!) 158/82     Pulse Rate 11/25/22 1051 63     Resp 11/25/22 1051 18     Temp 11/25/22 1051 98 F (36.7 C)     Temp Source 11/25/22 1051 Oral     SpO2 11/25/22 1051 98 %     Weight --      Height --      Head Circumference --      Peak Flow --      Pain Score 11/25/22 1050 0     Pain Loc --      Pain Edu? --      Excl. in [place]? --     Most recent vital signs: Vitals:   11/25/22 1230 11/25/22 1300  BP: 138/77 (!) 142/77  Pulse: 60 (!) 56  Resp: 16 12  Temp:    SpO2: 98% 98%    Constitutional: Alert and oriented. Eyes: Conjunctivae are normal. Head: Atraumatic. Nose: No congestion/rhinnorhea. Mouth/Throat: Mucous  membranes are moist.  Cardiovascular: Normal rate, regular rhythm. Grossly normal heart sounds.  2+ radial pulses bilaterally. Respiratory: Normal respiratory effort.  No retractions. Lungs CTAB.  Left chest wall tenderness to palpation noted. Gastrointestinal: Soft and nontender. No distention. Musculoskeletal: No lower extremity tenderness nor edema.  Neurologic:  Normal speech and language. No gross focal neurologic deficits are appreciated.    ED Results / Procedures / Treatments   Labs (all labs ordered are listed, but only abnormal results are displayed) Labs Reviewed  BASIC METABOLIC PANEL - Abnormal; Notable for the following components:      Result Value   Glucose, Bld 105 (*)    All other components within normal limits  CBC - Abnormal; Notable for the following components:   Hemoglobin 11.2 (*)    HCT 34.1 (*)    All other components within normal limits  TROPONIN I (HIGH SENSITIVITY)  TROPONIN I (HIGH SENSITIVITY)     EKG  ED ECG REPORT I, Blake Divine, the attending physician, personally viewed and interpreted this ECG.   Date: 11/25/2022  EKG  Time: 10:56  Rate: 68  Rhythm: normal sinus rhythm, isolated PVC  Axis: Normal  Intervals:none  ST&T Change: None  RADIOLOGY Chest x-ray reviewed and interpreted by me with no infiltrate, edema, or effusion.  PROCEDURES:  Critical Care performed: No  Procedures   MEDICATIONS ORDERED IN ED: Medications - No data to display   IMPRESSION / MDM / [place] / ED COURSE  I reviewed the triage vital signs and the nursing notes.                              72 y.o. female with past medical history of hyperlipidemia, GERD, hypothyroidism, and anxiety who presents to the ED complaining of intermittent chest pressure since yesterday with some difficulty breathing.  Patient's presentation is most consistent with acute presentation with potential threat to life or bodily function.  Differential  diagnosis includes, but is not limited to, ACS, PE, pneumonia, pneumothorax, dissection, musculoskeletal pain, GERD, and anxiety.  Patient well-appearing and in no acute distress, vital signs are unremarkable and pain has improved compared to earlier this morning.  With intermittent symptoms and reassuring vital signs, low suspicion for PE or dissection at this time.  EKG shows no evidence of arrhythmia or ischemia and symptoms seem atypical for ACS.  Initial troponin is negative, will check second set troponin given intermittent pain.  Chest x-ray is unremarkable and remainder of labs are reassuring with no significant anemia, leukocytosis, electrolyte abnormality, or AKI.  Repeat troponin within normal limits and patient currently chest pain-free.  She is appropriate for discharge home and will be provided with referral to follow-up with cardiology.  She was counseled to return to the ED for new or worsening symptoms, patient agrees with plan.      FINAL CLINICAL IMPRESSION(S) / ED DIAGNOSES   Final diagnoses:  Nonspecific chest pain     Rx / DC Orders   ED Discharge Orders          Ordered    Ambulatory referral to Cardiology        11/25/22 1341             Note:  This document was prepared using Dragon voice recognition software and may include unintentional dictation errors.   Blake Divine, MD 11/25/22 1343

## 2022-11-27 ENCOUNTER — Telehealth: Payer: Self-pay

## 2022-11-27 ENCOUNTER — Encounter: Payer: Self-pay | Admitting: Cardiovascular Disease

## 2022-11-27 ENCOUNTER — Ambulatory Visit: Payer: Medicare Other | Attending: Cardiovascular Disease | Admitting: Cardiovascular Disease

## 2022-11-27 VITALS — BP 128/82 | HR 66 | Ht 64.0 in | Wt 189.0 lb

## 2022-11-27 DIAGNOSIS — R0602 Shortness of breath: Secondary | ICD-10-CM

## 2022-11-27 DIAGNOSIS — R072 Precordial pain: Secondary | ICD-10-CM

## 2022-11-27 MED ORDER — METOPROLOL TARTRATE 25 MG PO TABS: ORAL_TABLET | ORAL | 0 refills | Status: DC

## 2022-11-27 NOTE — Progress Notes (Signed)
Cardiology Office Note:   Date:  11/27/2022  NAME:  Martha Walsh    MRN: [id number] DOB:  1950-09-27   PCP:  Glean Hess, MD  Cardiologist:  None  Electrophysiologist:  None   Referring MD: Eulogio Bear, MD   Chief Complaint  Patient presents with   Chest Pain   History of Present Illness:   Martha Walsh is a 72 y.o. female with a hx of HLD, GERD, hypothyroidism who is being seen today for the evaluation of chest pain at the request of Glean Hess, MD. Seen in ER 11/25/2022 for chest pain. Negative work-up. She reports for the past week she has had episodes of chest pressure.  She reports that occurs intermittently.  Comes and goes.  No identifiable trigger.  No alleviating factors.  Symptoms last several minutes and resolve without intervention.  She does notice some symptoms with exertion.  She also gets short of breath with exertion.  She is never had a heart attack or stroke.  She reports her father may have had a heart attack.  Her EKG is normal.  She has no history of high blood pressure.  Cholesterol seems to be fairly well-controlled.  She has never had a calcium score.  She is not diabetic.  She does not smoke.  No alcohol or drug use is reported.  She is retired.  She reports she will have eye surgery next week.  We discussed we will likely hold on this until evaluation is completed.  Her CV examination is normal.  She is married.  She has 3 children.  She has 3 grandchildren.  T chol 235, HDL 66, LDL 146, TG 133  Past Medical History: Past Medical History:  Diagnosis Date   Anxiety    Arthritis    GERD (gastroesophageal reflux disease)    Hyperlipidemia    Hypothyroid    Motion sickness    Boats    Past Surgical History: Past Surgical History:  Procedure Laterality Date   APPENDECTOMY     CATARACT EXTRACTION W/PHACO Left 11/18/2022   Procedure: CATARACT EXTRACTION PHACO AND INTRAOCULAR LENS PLACEMENT (IOC) LEFT  10.20  00:55.0;  Surgeon: Eulogio Bear, MD;  Location: [place];  Service: Ophthalmology;  Laterality: Left;   CHOLECYSTECTOMY  09/2022   COLONOSCOPY     ESOPHAGOGASTRODUODENOSCOPY (EGD) WITH PROPOFOL N/A 07/27/2020   Procedure: ESOPHAGOGASTRODUODENOSCOPY (EGD) WITH PROPOFOL;  Surgeon: Lucilla Lame, MD;  Location: [place];  Service: Endoscopy;  Laterality: N/A;   JOINT REPLACEMENT Left 02/2016   partial knee   TONSILLECTOMY     TUBAL LIGATION      Current Medications: Current Meds  Medication Sig   acetaminophen (TYLENOL) 500 MG tablet Take 2 tablets (1,000 mg total) by mouth every 6 (six) hours as needed for mild pain.   amoxicillin (AMOXIL) 500 MG tablet TAKE 4 TABLETS BY MOUTH (2 GRAMS) 60 MINUTES BEFORE DENTAL PROCEDURE   Calcium Carbonate-Vitamin D 600-400 MG-UNIT tablet Take 1 tablet by mouth daily.   Cholecalciferol 25 MCG (1000 UT) tablet Take 1,000 Units by mouth daily.   citalopram (CELEXA) 10 MG tablet Take 1 tablet (10 mg total) by mouth daily. (Patient taking differently: Take 10 mg by mouth at bedtime.)   ezetimibe (ZETIA) 10 MG tablet Take 1 tablet (10 mg total) by mouth daily. (Patient taking differently: Take 10 mg by mouth at bedtime.)   ibuprofen (ADVIL) 800 MG tablet Take 1 tablet (800 mg total) by  mouth every 8 (eight) hours as needed for moderate pain.   levothyroxine (SYNTHROID) 50 MCG tablet Take 1 tablet (50 mcg total) by mouth daily before breakfast.   metoprolol tartrate (LOPRESSOR) 25 MG tablet Take 1 tablet by mouth once for procedure.   Omega-3 Fatty Acids (FISH OIL PO) Take by mouth.   pantoprazole (PROTONIX) 40 MG tablet Take 1 tablet (40 mg total) by mouth daily.   potassium chloride (KLOR-CON) 10 MEQ tablet Take 10 mEq by mouth at bedtime. Every other day for leg cramps OTC   pravastatin (PRAVACHOL) 80 MG tablet Take 1 tablet (80 mg total) by mouth daily. (Patient taking differently: Take 80 mg by mouth at bedtime.)     Allergies:    Augmentin [amoxicillin-pot  clavulanate], Wound dressing adhesive, and Lisinopril   Social History: Social History   Socioeconomic History   Marital status: Married    Spouse name: Not on file   Number of children: 3   Years of education: Not on file   Highest education level: Associate degree: academic program  Occupational History   Occupation: retired  Tobacco Use   Smoking status: Former    Types: Cigarettes    Quit date: 09/23/1998    Years since quitting: 24.1    Passive exposure: Past   Smokeless tobacco: Never  Vaping Use   Vaping Use: Never used  Substance and Sexual Activity   Alcohol use: Yes    Alcohol/week: 7.0 standard drinks of alcohol    Types: 7 Glasses of wine per week    Comment: occasionally   Drug use: Never   Sexual activity: Not Currently  Other Topics Concern   Not on file  Social History Narrative   Not on file   Social Determinants of Health   Financial Resource Strain: Low Risk  (03/06/2022)   Overall Financial Resource Strain (CARDIA)    Difficulty of Paying Living Expenses: Not hard at all  Food Insecurity: No Food Insecurity (11/27/2022)   Hunger Vital Sign    Worried About Running Out of Food in the Last Year: Never true    Ran Out of Food in the Last Year: Never true  Transportation Needs: No Transportation Needs (11/27/2022)   PRAPARE - Hydrologist (Medical): No    Lack of Transportation (Non-Medical): No  Physical Activity: Inactive (03/06/2022)   Exercise Vital Sign    Days of Exercise per Week: 0 days    Minutes of Exercise per Session: 0 min  Stress: No Stress Concern Present (03/06/2022)   [place]    Feeling of Stress : Not at all  Social Connections: [place] (03/06/2022)   Social Connection and Isolation Panel [NHANES]    Frequency of Communication with Friends and Family: More than three times a week    Frequency of Social Gatherings with Friends and  Family: More than three times a week    Attends Religious Services: More than 4 times per year    Active Member of [employer] or Organizations: Yes    Attends Music therapist: More than 4 times per year    Marital Status: Married     Family History: The patient's family history includes Heart disease in her father.  ROS:   All other ROS reviewed and negative. Pertinent positives noted in the HPI.     EKGs/Labs/Other Studies Reviewed:   The following studies were personally reviewed by me today:  EKG:  EKG is ordered today.  The ekg ordered today demonstrates normal sinus rhythm heart rate 66, no acute ischemic changes or evidence of infarction, and was personally reviewed by me.   Recent Labs: 05/21/2022: ALT 13 11/04/2022: TSH 5.820 11/25/2022: BUN 23; Creatinine, Ser 0.92; Hemoglobin 11.2; Platelets 200; Potassium 4.2; Sodium 139   Recent Lipid Panel    Component Value Date/Time   CHOL 235 (H) 05/06/2022 0936   TRIG 133 05/06/2022 0936   HDL 66 05/06/2022 0936   CHOLHDL 3.6 05/06/2022 0936   LDLCALC 146 (H) 05/06/2022 0936    Physical Exam:   VS:  BP 128/82 (BP Location: Left Arm, Patient Position: Sitting, Cuff Size: Normal)   Pulse 66   Ht '5\' 4"'$  (1.626 m)   Wt 189 lb (85.7 kg)   SpO2 92%   BMI 32.44 kg/m    Wt Readings from Last 3 Encounters:  11/27/22 189 lb (85.7 kg)  11/18/22 188 lb (85.3 kg)  11/04/22 188 lb 6.4 oz (85.5 kg)    General: Well nourished, well developed, in no acute distress Head: Atraumatic, normal size  Eyes: PEERLA, EOMI  Neck: Supple, no JVD Endocrine: No thryomegaly Cardiac: Normal S1, S2; RRR; no murmurs, rubs, or gallops Lungs: Clear to auscultation bilaterally, no wheezing, rhonchi or rales  Abd: Soft, nontender, no hepatomegaly  Ext: No edema, pulses 2+ Musculoskeletal: No deformities, BUE and BLE strength normal and equal Skin: Warm and dry, no rashes   Neuro: Alert and oriented to person, place, time, and situation,  CNII-XII grossly intact, no focal deficits  Psych: Normal mood and affect   ASSESSMENT:   Martha Walsh is a 72 y.o. female who presents for the following: 1. Precordial pain   2. SOB (shortness of breath) on exertion     PLAN:    1. Precordial pain 2. SOB (shortness of breath) on exertion -Intermittent episodes of chest pressure.  Does not sound cardiac but uncertain.  EKG is normal.  Recent emergency room evaluation was negative.  No strong family history of heart disease but does have a family history of this.  Her biggest CV risk factors are age and hyperlipidemia.  She is also obese.  We will proceed with coronary CTA and echocardiogram for evaluation.  No need for BMP.  She will take 25 mg metoprolol to tartrate 2 hours before the scan.  She will see me back as needed based on the results of the scan.  Unfortunately she will have to delay her eye surgery.      Disposition: Return if symptoms worsen or fail to improve.  Medication Adjustments/Labs and Tests Ordered: Current medicines are reviewed at length with the patient today.  Concerns regarding medicines are outlined above.  Orders Placed This Encounter  Procedures   CT CORONARY MORPH W/CTA COR W/SCORE W/CA W/CM &/OR WO/CM   EKG 12-Lead   ECHOCARDIOGRAM COMPLETE   Meds ordered this encounter  Medications   metoprolol tartrate (LOPRESSOR) 25 MG tablet    Sig: Take 1 tablet by mouth once for procedure.    Dispense:  1 tablet    Refill:  0    Patient Instructions  Medication Instructions:  Take Metoprolol 25 mg two hours before CT when scheduled.   *If you need a refill on your cardiac medications before your next appointment, please call your pharmacy*   Testing/Procedures: Coronary CTA- they will call you to schedule.  Echocardiogram - Your physician has requested that you have an echocardiogram. Echocardiography is  a painless test that uses sound waves to create images of your heart. It provides your doctor  with information about the size and shape of your heart and how well your heart's chambers and valves are working. This procedure takes approximately one hour. There are no restrictions for this procedure.    Follow-Up: At [facility], you and your health needs are our priority.  As part of our continuing mission to provide you with exceptional heart care, we have created designated Provider Care Teams.  These Care Teams include your primary Cardiologist (physician) and Advanced Practice Providers (APPs -  Physician Assistants and Nurse Practitioners) who all work together to provide you with the care you need, when you need it.  We recommend signing up for the patient portal called "MyChart".  Sign up information is provided on this After Visit Summary.  MyChart is used to connect with patients for Virtual Visits (Telemedicine).  Patients are able to view lab/test results, encounter notes, upcoming appointments, etc.  Non-urgent messages can be sent to your provider as well.   To learn more about what you can do with MyChart, go to [email or web address].    Your next appointment:   As needed  Provider:   Eleonore Chiquito, MD   Other Instructions   Your cardiac CT will be scheduled at one of the below locations:   [facility] [address] [place], [place] [address] [phone]   If scheduled at [facility], please arrive at the [facility] and Children's Entrance (Entrance C2) of [facility] 30 minutes prior to test start time. You can use the FREE valet parking offered at entrance C (encouraged to control the heart rate for the test)  Proceed to the [facility] Radiology Department (first floor) to check-in and test prep.  All radiology patients and guests should use entrance C2 at [facility], accessed from [facility], even though the hospital's physical address listed is [address]      Please follow these  instructions carefully (unless otherwise directed):   On the Night Before the Test: Be sure to Drink plenty of water. Do not consume any caffeinated/decaffeinated beverages or chocolate 12 hours prior to your test. Do not take any antihistamines 12 hours prior to your test.  On the Day of the Test: Drink plenty of water until 1 hour prior to the test. Do not eat any food 1 hour prior to test. You may take your regular medications prior to the test.  Take metoprolol (Lopressor) two hours prior to test. If you take Furosemide/Hydrochlorothiazide/Spironolactone, please HOLD on the morning of the test. FEMALES- please wear underwire-free bra if available, avoid dresses & tight clothing      After the Test: Drink plenty of water. After receiving IV contrast, you may experience a mild flushed feeling. This is normal. On occasion, you may experience a mild rash up to 24 hours after the test. This is not dangerous. If this occurs, you can take Benadryl 25 mg and increase your fluid intake. If you experience trouble breathing, this can be serious. If it is severe call 911 IMMEDIATELY. If it is mild, please call our office. If you take any of these medications: Glipizide/Metformin, Avandament, Glucavance, please do not take 48 hours after completing test unless otherwise instructed.  We will call to schedule your test 2-4 weeks out understanding that some insurance companies will need an authorization prior to the service being performed.  For non-scheduling related questions, please contact the cardiac imaging nurse navigator should you have any questions/concerns: Marchia Bond, Cardiac Imaging Nurse Navigator Gordy Clement, Cardiac Imaging Nurse Navigator [facility] Direct Office Dial: [phone]   For scheduling needs, including cancellations and rescheduling, please call [place], [phone].    Signed, Addison Naegeli. Audie Box, MD, [place]  [address], [place], [place] [address] [phone]  11/27/2022 4:15 PM

## 2022-11-27 NOTE — Patient Instructions (Addendum)
Medication Instructions:  Take Metoprolol 25 mg two hours before CT when scheduled.   *If you need a refill on your cardiac medications before your next appointment, please call your pharmacy*   Testing/Procedures: Coronary CTA- they will call you to schedule.  Echocardiogram - Your physician has requested that you have an echocardiogram. Echocardiography is a painless test that uses sound waves to create images of your heart. It provides your doctor with information about the size and shape of your heart and how well your heart's chambers and valves are working. This procedure takes approximately one hour. There are no restrictions for this procedure.    Follow-Up: At [facility], you and your health needs are our priority.  As part of our continuing mission to provide you with exceptional heart care, we have created designated Provider Care Teams.  These Care Teams include your primary Cardiologist (physician) and Advanced Practice Providers (APPs -  Physician Assistants and Nurse Practitioners) who all work together to provide you with the care you need, when you need it.  We recommend signing up for the patient portal called "MyChart".  Sign up information is provided on this After Visit Summary.  MyChart is used to connect with patients for Virtual Visits (Telemedicine).  Patients are able to view lab/test results, encounter notes, upcoming appointments, etc.  Non-urgent messages can be sent to your provider as well.   To learn more about what you can do with MyChart, go to [email or web address].    Your next appointment:   As needed  Provider:   Eleonore Chiquito, MD   Other Instructions   Your cardiac CT will be scheduled at one of the below locations:   [facility] [address] [place], [place] [address] [phone]   If scheduled at [facility], please arrive at the [facility] and Children's Entrance (Entrance C2) of [facility] 30  minutes prior to test start time. You can use the FREE valet parking offered at entrance C (encouraged to control the heart rate for the test)  Proceed to the [facility] Radiology Department (first floor) to check-in and test prep.  All radiology patients and guests should use entrance C2 at [facility], accessed from [facility], even though the hospital's physical address listed is [address]      Please follow these instructions carefully (unless otherwise directed):   On the Night Before the Test: Be sure to Drink plenty of water. Do not consume any caffeinated/decaffeinated beverages or chocolate 12 hours prior to your test. Do not take any antihistamines 12 hours prior to your test.  On the Day of the Test: Drink plenty of water until 1 hour prior to the test. Do not eat any food 1 hour prior to test. You may take your regular medications prior to the test.  Take metoprolol (Lopressor) two hours prior to test. If you take Furosemide/Hydrochlorothiazide/Spironolactone, please HOLD on the morning of the test. FEMALES- please wear underwire-free bra if available, avoid dresses & tight clothing      After the Test: Drink plenty of water. After receiving IV contrast, you may experience a mild flushed feeling. This is normal. On occasion, you may experience a mild rash up to 24 hours after the test. This is not dangerous. If this occurs, you can take Benadryl 25 mg and increase your fluid intake. If you experience trouble breathing, this can be serious. If it is severe call 911 IMMEDIATELY. If it  is mild, please call our office. If you take any of these medications: Glipizide/Metformin, Avandament, Glucavance, please do not take 48 hours after completing test unless otherwise instructed.  We will call to schedule your test 2-4 weeks out understanding that some insurance companies will need an authorization prior to the service being performed.   For  non-scheduling related questions, please contact the cardiac imaging nurse navigator should you have any questions/concerns: Marchia Bond, Cardiac Imaging Nurse Navigator Gordy Clement, Cardiac Imaging Nurse Navigator [facility] Direct Office Dial: [phone]   For scheduling needs, including cancellations and rescheduling, please call [place], [phone].

## 2022-11-27 NOTE — Transitions of Care (Post Inpatient/ED Visit) (Signed)
   11/27/2022  Name: Martha Walsh MRN: [id number] DOB: 1950-10-02  Today's TOC FU Call Status: Today's TOC FU Call Status:: Unsuccessul Call (1st Attempt) Unsuccessful Call (1st Attempt) Date: 11/27/22 (Red on EMMI-ED Discharge Alert Date & Reason:11/26/22-"Scheduled follow-up appt? No")  Attempted to reach the patient regarding the most recent Inpatient/ED visit.  Follow Up Plan: Additional outreach attempts will be made to reach the patient to complete the Transitions of Care (Post Inpatient/ED visit) call.     Enzo Montgomery, RN,BSN,CCM [facility] Care Management Care Management Community Coordinator Direct Phone: [phone] Toll Free: [phone] Fax: [phone]

## 2022-11-27 NOTE — Transitions of Care (Post Inpatient/ED Visit) (Signed)
   11/27/2022  Name: Martha Walsh MRN: [id number] DOB: July 26, 1951  Today's TOC FU Call Status: Today's TOC FU Call Status:: Successful TOC FU Call Competed TOC FU Call Complete Date: 11/27/22  Transition Care Management Follow-up Telephone Call Date of Discharge: 11/25/22 Discharge Facility: [facility]) Type of Discharge: Emergency Department Reason for ED Visit: Cardiac Conditions ("nonspecific CP") How have you been since you were released from the hospital?: Better Any questions or concerns?: Yes Patient Questions/Concerns:: Patient states she is scheduled for cataracts surgery next week adn wondering if she will be okay to proceed with surgery Patient Questions/Concerns Addressed: Other: (patient has cardiology appt today-advised to discuss with provider)  Items Reviewed: Did you receive and understand the discharge instructions provided?: Yes Medications obtained and verified?: Yes (Medications Reviewed) Any new allergies since your discharge?: No Dietary orders reviewed?: Yes Type of Diet Ordered:: low salt/heart healthy Do you have support at home?: Yes People in Home: spouse Name of Support/Comfort Primary Source: [facility] and Equipment/Supplies: [place] Ordered?: NA Any new equipment or medical supplies ordered?: NA  Functional Questionnaire: Do you need assistance with bathing/showering or dressing?: No Do you need assistance with meal preparation?: No Do you need assistance with eating?: No Do you have difficulty maintaining continence: No Do you need assistance with getting out of bed/getting out of a chair/moving?: No Do you have difficulty managing or taking your medications?: No  Folllow up appointments reviewed: PCP Follow-up appointment confirmed?: NA Specialist Hospital Follow-up appointment confirmed?: Yes Date of Specialist follow-up appointment?: 11/27/22 Follow-Up Specialty Provider:: Dr. Audie Box Do  you need transportation to your follow-up appointment?: No Do you understand care options if your condition(s) worsen?: Yes-patient verbalized understanding  SDOH Interventions Today    Flowsheet Row Most Recent Value  SDOH Interventions   Food Insecurity Interventions Intervention Not Indicated         [name] Care Management Care Management Community Coordinator Direct Phone: [phone] Toll Free: [phone] Fax: [phone]

## 2022-11-28 ENCOUNTER — Telehealth: Payer: Self-pay | Admitting: Internal Medicine

## 2022-11-28 ENCOUNTER — Other Ambulatory Visit: Payer: Self-pay

## 2022-11-28 MED ORDER — NITROGLYCERIN 0.4 MG SL SUBL: 0.4000 mg | SUBLINGUAL_TABLET | SUBLINGUAL | 1 refills | Status: DC | PRN

## 2022-11-28 NOTE — Discharge Instructions (Signed)

## 2022-11-28 NOTE — Telephone Encounter (Signed)
Pt spoke with someone on Monday regarding pressure she was having and was told to go to the emergency room. Pt went to the emergency room and all tests that were done came back negative. Pt saw a cardiologist and forgot to ask them to prescribe Nitroglycerin and wanted to know if Dr. Army Melia could prescribe it for her. Please advise.

## 2022-11-28 NOTE — Telephone Encounter (Signed)
Nitroglycerin sent to [facility] in [place].

## 2022-12-02 ENCOUNTER — Ambulatory Visit: Admission: RE | Admit: 2022-12-02 | Payer: Medicare Other | Source: Home / Self Care | Admitting: Ophthalmology

## 2022-12-02 SURGERY — PHACOEMULSIFICATION, CATARACT, WITH IOL INSERTION
Anesthesia: Topical | Laterality: Right

## 2022-12-04 ENCOUNTER — Telehealth (HOSPITAL_COMMUNITY): Payer: Self-pay | Admitting: *Deleted

## 2022-12-04 NOTE — Telephone Encounter (Signed)
Attempted to call patient regarding upcoming cardiac CT appointment. °Left message on voicemail with name and callback number ° °Collins Dimaria RN Navigator Cardiac Imaging °[facility] °[phone] Office °[phone] Cell ° °

## 2022-12-04 NOTE — Telephone Encounter (Signed)
Patient returning call about her upcoming cardiac imaging study; pt verbalizes understanding of appt date/time, parking situation and where to check in, pre-test NPO status and medications ordered, and verified current allergies; name and call back number provided for further questions should they arise  Gordy Clement RN Navigator Cardiac Imaging [facility] [phone] office [phone] cell  Patient to take '25mg'$  metoprolol tartrate two hours prior to her cardiac CT scan.  She is aware to arrive at 11:30am.

## 2022-12-05 ENCOUNTER — Ambulatory Visit (HOSPITAL_COMMUNITY)
Admission: RE | Admit: 2022-12-05 | Discharge: 2022-12-05 | Disposition: A | Discharge status: Home / Self Care | Payer: Medicare Other | Source: Ambulatory Visit | Attending: Cardiovascular Disease | Admitting: Cardiovascular Disease

## 2022-12-05 ENCOUNTER — Ambulatory Visit (HOSPITAL_BASED_OUTPATIENT_CLINIC_OR_DEPARTMENT_OTHER)
Admission: RE | Admit: 2022-12-05 | Discharge: 2022-12-05 | Disposition: A | Discharge status: Home / Self Care | Payer: Medicare Other | Source: Ambulatory Visit | Attending: Cardiology | Admitting: Cardiology

## 2022-12-05 ENCOUNTER — Other Ambulatory Visit: Payer: Self-pay | Admitting: Cardiology

## 2022-12-05 DIAGNOSIS — I2510 Atherosclerotic heart disease of native coronary artery without angina pectoris: Secondary | ICD-10-CM | POA: Diagnosis not present

## 2022-12-05 DIAGNOSIS — R931 Abnormal findings on diagnostic imaging of heart and coronary circulation: Secondary | ICD-10-CM | POA: Diagnosis not present

## 2022-12-05 DIAGNOSIS — R072 Precordial pain: Secondary | ICD-10-CM | POA: Diagnosis not present

## 2022-12-05 MED ORDER — IOHEXOL 350 MG/ML SOLN
100.0000 mL | Freq: Once | INTRAVENOUS | Status: AC | PRN
  Administered 2022-12-05: 100 mL via INTRAVENOUS

## 2022-12-05 MED ORDER — NITROGLYCERIN 0.4 MG SL SUBL: 0.8000 mg | SUBLINGUAL_TABLET | SUBLINGUAL | Status: DC | PRN

## 2022-12-05 MED ORDER — NITROGLYCERIN 0.4 MG SL SUBL
SUBLINGUAL_TABLET | SUBLINGUAL | Status: AC
  Administered 2022-12-05: 0.8 mg via SUBLINGUAL
  Filled 2022-12-05: qty 2

## 2022-12-09 ENCOUNTER — Other Ambulatory Visit: Payer: Self-pay

## 2022-12-09 ENCOUNTER — Telehealth: Payer: Self-pay | Admitting: Cardiovascular Disease

## 2022-12-09 MED ORDER — ATORVASTATIN CALCIUM 40 MG PO TABS: 40.0000 mg | ORAL_TABLET | Freq: Every day | ORAL | 3 refills | Status: DC

## 2022-12-09 NOTE — Telephone Encounter (Signed)
Called patient, advised of message below.   Patient will try Lipitor 40- removed Pravastatin from list.   Verified pharmacy.   Patient verbalized understanding.  Also questioned if she should keep ECHO appointment, recommended she keep this- she understood.

## 2022-12-09 NOTE — Telephone Encounter (Signed)
Pt c/o medication issue:  1. Name of Medication: crestor  2. How are you currently taking this medication (dosage and times per day)? She is not take it yet.   3. Are you having a reaction (difficulty breathing--STAT)? no  4. What is your medication issue? Patient called stating doctor wants to put her on crestor but she can't take crestor because it gives her bad neuropathy.  She wants to know if there is another medication she can be put on.

## 2022-12-09 NOTE — Telephone Encounter (Signed)
Called patient, advised that I would send a message over to Dr.O'Neal to review.   Patient states she did not remember until he said Crestor in his message- she is not able to take it and would like to have something else- I added this to her allergy list.   Will route to MD. Thanks!

## 2022-12-10 DIAGNOSIS — M9905 Segmental and somatic dysfunction of pelvic region: Secondary | ICD-10-CM | POA: Diagnosis not present

## 2022-12-10 DIAGNOSIS — M1711 Unilateral primary osteoarthritis, right knee: Secondary | ICD-10-CM | POA: Diagnosis not present

## 2022-12-10 DIAGNOSIS — M9903 Segmental and somatic dysfunction of lumbar region: Secondary | ICD-10-CM | POA: Diagnosis not present

## 2022-12-10 DIAGNOSIS — M5416 Radiculopathy, lumbar region: Secondary | ICD-10-CM | POA: Diagnosis not present

## 2022-12-10 DIAGNOSIS — M4306 Spondylolysis, lumbar region: Secondary | ICD-10-CM | POA: Diagnosis not present

## 2022-12-11 DIAGNOSIS — M25561 Pain in right knee: Secondary | ICD-10-CM | POA: Diagnosis not present

## 2022-12-17 DIAGNOSIS — M25561 Pain in right knee: Secondary | ICD-10-CM | POA: Diagnosis not present

## 2022-12-25 NOTE — Anesthesia Preprocedure Evaluation (Addendum)
Anesthesia Evaluation  Patient identified by MRN, date of birth, ID band Patient awake    History of Anesthesia Complications (+) PONV and history of anesthetic complications  Airway Mallampati: III  TM Distance: <3 FB Neck ROM: Full    Dental no notable dental hx.    Pulmonary former smoker   Pulmonary exam normal breath sounds clear to auscultation       Cardiovascular Normal cardiovascular exam Rhythm:Regular Rate:Normal     Neuro/Psych  PSYCHIATRIC DISORDERS Anxiety        GI/Hepatic   Endo/Other  Hypothyroidism    Renal/GU CRFRenal diseaseCKD stage 3a     Musculoskeletal  (+) Arthritis , Osteoarthritis,    Abdominal   Peds  Hematology   Anesthesia Other Findings Hyperlipidemia  GERD (gastroesophageal reflux disease) Hypothyroid  Arthritis Anxiety Motion sickness CKD stage 3a    Reproductive/Obstetrics                             Anesthesia Physical Anesthesia Plan  ASA: 3  Anesthesia Plan: MAC   Post-op Pain Management:    Induction: Intravenous  PONV Risk Score and Plan:   Airway Management Planned: Natural Airway and Nasal Cannula  Additional Equipment:   Intra-op Plan:   Post-operative Plan:   Informed Consent: I have reviewed the patients History and Physical, chart, labs and discussed the procedure including the risks, benefits and alternatives for the proposed anesthesia with the patient or authorized representative who has indicated his/her understanding and acceptance.     Dental Advisory Given  Plan Discussed with: Anesthesiologist, CRNA and Surgeon  Anesthesia Plan Comments: (Patient consented for risks of anesthesia including but not limited to:  - adverse reactions to medications - damage to eyes, teeth, lips or other oral mucosa - nerve damage due to positioning  - sore throat or hoarseness - Damage to heart, brain, nerves, lungs, other parts  of body or loss of life  Patient voiced understanding.)       Anesthesia Quick Evaluation

## 2022-12-26 NOTE — Discharge Instructions (Signed)

## 2022-12-27 ENCOUNTER — Ambulatory Visit (HOSPITAL_COMMUNITY): Payer: Medicare Other | Attending: Cardiology

## 2022-12-27 DIAGNOSIS — R072 Precordial pain: Secondary | ICD-10-CM | POA: Diagnosis not present

## 2022-12-27 LAB — ECHOCARDIOGRAM COMPLETE
Area-P 1/2: 4.64 cm2
P 1/2 time: 722 msec
S' Lateral: 2.9 cm

## 2022-12-30 ENCOUNTER — Ambulatory Visit
Admission: RE | Admit: 2022-12-30 | Discharge: 2022-12-30 | Disposition: A | Discharge status: Home / Self Care | Payer: Medicare Other | Attending: Ophthalmology | Admitting: Ophthalmology

## 2022-12-30 ENCOUNTER — Ambulatory Visit: Payer: Medicare Other | Admitting: Anesthesiology

## 2022-12-30 ENCOUNTER — Other Ambulatory Visit: Payer: Self-pay

## 2022-12-30 ENCOUNTER — Encounter
Admission: RE | Disposition: A | Discharge status: Home / Self Care | Payer: Self-pay | Source: Home / Self Care | Attending: Ophthalmology

## 2022-12-30 ENCOUNTER — Encounter: Payer: Self-pay | Admitting: Ophthalmology

## 2022-12-30 DIAGNOSIS — E785 Hyperlipidemia, unspecified: Secondary | ICD-10-CM | POA: Diagnosis not present

## 2022-12-30 DIAGNOSIS — H2511 Age-related nuclear cataract, right eye: Secondary | ICD-10-CM | POA: Diagnosis not present

## 2022-12-30 DIAGNOSIS — E782 Mixed hyperlipidemia: Secondary | ICD-10-CM | POA: Diagnosis not present

## 2022-12-30 DIAGNOSIS — I509 Heart failure, unspecified: Secondary | ICD-10-CM | POA: Insufficient documentation

## 2022-12-30 DIAGNOSIS — K219 Gastro-esophageal reflux disease without esophagitis: Secondary | ICD-10-CM | POA: Diagnosis not present

## 2022-12-30 DIAGNOSIS — N1831 Chronic kidney disease, stage 3a: Secondary | ICD-10-CM | POA: Diagnosis not present

## 2022-12-30 DIAGNOSIS — Z87891 Personal history of nicotine dependence: Secondary | ICD-10-CM | POA: Diagnosis not present

## 2022-12-30 DIAGNOSIS — F419 Anxiety disorder, unspecified: Secondary | ICD-10-CM | POA: Diagnosis not present

## 2022-12-30 DIAGNOSIS — M1990 Unspecified osteoarthritis, unspecified site: Secondary | ICD-10-CM | POA: Insufficient documentation

## 2022-12-30 DIAGNOSIS — E039 Hypothyroidism, unspecified: Secondary | ICD-10-CM | POA: Diagnosis not present

## 2022-12-30 HISTORY — PX: CATARACT EXTRACTION W/PHACO: SHX586

## 2022-12-30 SURGERY — PHACOEMULSIFICATION, CATARACT, WITH IOL INSERTION
Anesthesia: Monitor Anesthesia Care | Site: Eye | Laterality: Right

## 2022-12-30 MED ORDER — TETRACAINE HCL 0.5 % OP SOLN
1.0000 [drp] | OPHTHALMIC | Status: DC | PRN
  Administered 2022-12-30 (×3): 1 [drp] via OPHTHALMIC

## 2022-12-30 MED ORDER — MIDAZOLAM HCL 2 MG/2ML IJ SOLN
INTRAMUSCULAR | Status: DC | PRN
  Administered 2022-12-30: 2 mg via INTRAVENOUS

## 2022-12-30 MED ORDER — SIGHTPATH DOSE#1 BSS IO SOLN
INTRAOCULAR | Status: DC | PRN
  Administered 2022-12-30: 15 mL

## 2022-12-30 MED ORDER — MOXIFLOXACIN HCL 0.5 % OP SOLN
OPHTHALMIC | Status: DC | PRN
  Administered 2022-12-30: .2 mL via OPHTHALMIC

## 2022-12-30 MED ORDER — LACTATED RINGERS IV SOLN: INTRAVENOUS | Status: DC

## 2022-12-30 MED ORDER — SIGHTPATH DOSE#1 NA HYALUR & NA CHOND-NA HYALUR IO KIT
PACK | INTRAOCULAR | Status: DC | PRN
  Administered 2022-12-30: 1 via OPHTHALMIC

## 2022-12-30 MED ORDER — SIGHTPATH DOSE#1 BSS IO SOLN
INTRAOCULAR | Status: DC | PRN
  Administered 2022-12-30: 68 mL via OPHTHALMIC

## 2022-12-30 MED ORDER — ARMC OPHTHALMIC DILATING DROPS
1.0000 | OPHTHALMIC | Status: DC | PRN
  Administered 2022-12-30 (×3): 1 via OPHTHALMIC

## 2022-12-30 MED ORDER — LIDOCAINE HCL (PF) 2 % IJ SOLN
INTRAOCULAR | Status: DC | PRN
  Administered 2022-12-30: 1 mL via INTRAOCULAR

## 2022-12-30 MED ORDER — FENTANYL CITRATE (PF) 100 MCG/2ML IJ SOLN
INTRAMUSCULAR | Status: DC | PRN
  Administered 2022-12-30: 25 ug via INTRAVENOUS

## 2022-12-30 SURGICAL SUPPLY — 13 items
CATARACT SUITE SIGHTPATH (MISCELLANEOUS) ×1 IMPLANT
DISSECTOR HYDRO NUCLEUS 50X22 (MISCELLANEOUS) ×1 IMPLANT
FEE CATARACT SUITE SIGHTPATH (MISCELLANEOUS) ×1 IMPLANT
GLOVE SURG GAMMEX PI TX LF 7.5 (GLOVE) ×1 IMPLANT
GLOVE SURG SYN 8.5  E (GLOVE) ×1
GLOVE SURG SYN 8.5 E (GLOVE) ×1 IMPLANT
GLOVE SURG SYN 8.5 PF PI (GLOVE) ×1 IMPLANT
LENS IOL TECNIS EYHANCE 24.0 (Intraocular Lens) IMPLANT
NDL FILTER BLUNT 18X1 1/2 (NEEDLE) ×1 IMPLANT
NEEDLE FILTER BLUNT 18X1 1/2 (NEEDLE) ×1 IMPLANT
SYR 3ML LL SCALE MARK (SYRINGE) ×1 IMPLANT
SYR 5ML LL (SYRINGE) ×1 IMPLANT
WATER STERILE IRR 250ML POUR (IV SOLUTION) ×1 IMPLANT

## 2022-12-30 NOTE — H&P (Signed)
[facility]   Primary Care Physician:  Reubin MilanBerglund, Laura H, MD Ophthalmologist: Dr. Willey BladeBradley Nadina Fomby  Pre-Procedure History & Physical: HPI:  Leroy Libmanllen S Parrales is a 72 y.o. female here for cataract surgery.   Past Medical History:  Diagnosis Date   Anxiety    Arthritis    GERD (gastroesophageal reflux disease)    Hyperlipidemia    Hypothyroid    Motion sickness    Boats    Past Surgical History:  Procedure Laterality Date   APPENDECTOMY     CATARACT EXTRACTION W/PHACO Left 11/18/2022   Procedure: CATARACT EXTRACTION PHACO AND INTRAOCULAR LENS PLACEMENT (IOC) LEFT  10.20  00:55.0;  Surgeon: [place] CraneKing, Rishabh Rinkenberger Mark, MD;  [facility];  Service: Ophthalmology;  Laterality: Left;   CHOLECYSTECTOMY  09/2022   COLONOSCOPY     ESOPHAGOGASTRODUODENOSCOPY (EGD) WITH PROPOFOL N/A 07/27/2020   Procedure: ESOPHAGOGASTRODUODENOSCOPY (EGD) WITH PROPOFOL;  Surgeon: Midge MiniumWohl, Darren, MD;  [facility];  Service: Endoscopy;  Laterality: N/A;   JOINT REPLACEMENT Left 02/2016   partial knee   TONSILLECTOMY     TUBAL LIGATION      Prior to Admission medications   Medication Sig Start Date End Date Taking? Authorizing Provider  acetaminophen (TYLENOL) 500 MG tablet Take 2 tablets (1,000 mg total) by mouth every 6 (six) hours as needed for mild pain. 09/26/22  Yes Piscoya, Jose, MD  amoxicillin (AMOXIL) 500 MG tablet TAKE 4 TABLETS BY MOUTH (2 GRAMS) 60 MINUTES BEFORE DENTAL PROCEDURE   Yes [provider]  atorvastatin (LIPITOR) 40 MG tablet Take 1 tablet (40 mg total) by mouth daily. 12/09/22 12/04/23 Yes O'Neal, Ronnald RampWesley Thomas, MD  Calcium Carbonate-Vitamin D 600-400 MG-UNIT tablet Take 1 tablet by mouth daily. 09/29/12  Yes [provider]  Cholecalciferol 25 MCG (1000 UT) tablet Take 1,000 Units by mouth daily.   Yes [provider]  citalopram (CELEXA) 10 MG tablet Take 1 tablet (10 mg total) by mouth daily. Patient taking differently: Take 10 mg  by mouth at bedtime. 05/06/22  Yes Reubin MilanBerglund, Laura H, MD  clonazePAM (KLONOPIN) 0.5 MG tablet Take 0.5 mg by mouth as needed for anxiety. 03/03/19  Yes [provider]  cyclobenzaprine (FLEXERIL) 5 MG tablet Take 5 mg by mouth as needed. 11/03/20  Yes [provider]  ezetimibe (ZETIA) 10 MG tablet Take 1 tablet (10 mg total) by mouth daily. Patient taking differently: Take 10 mg by mouth at bedtime. 05/06/22  Yes Reubin MilanBerglund, Laura H, MD  ibuprofen (ADVIL) 800 MG tablet Take 1 tablet (800 mg total) by mouth every 8 (eight) hours as needed for moderate pain. 09/26/22  Yes Piscoya, Elita QuickJose, MD  levothyroxine (SYNTHROID) 50 MCG tablet Take 1 tablet (50 mcg total) by mouth daily before breakfast. 11/06/22  Yes Reubin MilanBerglund, Laura H, MD  nitroGLYCERIN (NITROSTAT) 0.4 MG SL tablet Place 1 tablet (0.4 mg total) under the tongue every 5 (five) minutes as needed for chest pain. 11/28/22  Yes Reubin MilanBerglund, Laura H, MD  Omega-3 Fatty Acids (FISH OIL PO) Take by mouth.   Yes [provider]  pantoprazole (PROTONIX) 40 MG tablet Take 1 tablet (40 mg total) by mouth daily. 12/24/21  Yes Midge MiniumWohl, Darren, MD  potassium chloride (KLOR-CON) 10 MEQ tablet Take 10 mEq by mouth at bedtime. Every other day for leg cramps OTC   Yes [provider]  metoprolol tartrate (LOPRESSOR) 25 MG tablet Take 1 tablet by mouth once for procedure. Patient not taking: Reported on 12/30/2022 11/27/22  Sande Rives, MD    Allergies as of 12/10/2022 - Review Complete 12/05/2022  Allergen Reaction Noted   Augmentin [amoxicillin-pot clavulanate] Nausea Only 11/22/2013   Wound dressing adhesive Itching and Rash 09/18/2022   Crestor [rosuvastatin] Other (See Comments) 12/09/2022   Lisinopril Cough 09/10/2016    Family History  Problem Relation Age of Onset   Heart disease Father     Social History   Socioeconomic History   Marital status: Married    Spouse name: Not on file   Number of children: 3   Years of  education: Not on file   Highest education level: Associate degree: academic program  Occupational History   Occupation: retired  Tobacco Use   Smoking status: Former    Types: Cigarettes    Quit date: 09/23/1998    Years since quitting: 24.2    Passive exposure: Past   Smokeless tobacco: Never  Vaping Use   Vaping Use: Never used  Substance and Sexual Activity   Alcohol use: Yes    Alcohol/week: 7.0 standard drinks of alcohol    Types: 7 Glasses of wine per week    Comment: occasionally   Drug use: Never   Sexual activity: Not Currently  Other Topics Concern   Not on file  Social History Narrative   Not on file   Social Determinants of Health   Financial Resource Strain: Low Risk  (03/06/2022)   Overall Financial Resource Strain (CARDIA)    Difficulty of Paying Living Expenses: Not hard at all  Food Insecurity: No Food Insecurity (11/27/2022)   Hunger Vital Sign    Worried About Running Out of Food in the Last Year: Never true    Ran Out of Food in the Last Year: Never true  Transportation Needs: No Transportation Needs (11/27/2022)   PRAPARE - Administrator, Civil Service (Medical): No    Lack of Transportation (Non-Medical): No  Physical Activity: Inactive (03/06/2022)   Exercise Vital Sign    Days of Exercise per Week: 0 days    Minutes of Exercise per Session: 0 min  Stress: No Stress Concern Present (03/06/2022)   [employer] of Occupational Health - Occupational Stress Questionnaire    Feeling of Stress : Not at all  Social Connections: Socially Integrated (03/06/2022)   Social Connection and Isolation Panel [NHANES]    Frequency of Communication with Friends and Family: More than three times a week    Frequency of Social Gatherings with Friends and Family: More than three times a week    Attends Religious Services: More than 4 times per year    Active Member of [employer] or Organizations: Yes    Attends Banker Meetings: More than 4 times  per year    Marital Status: Married  Catering manager Violence: Not At Risk (03/06/2022)   Humiliation, Afraid, Rape, and Kick questionnaire    Fear of Current or Ex-Partner: No    Emotionally Abused: No    Physically Abused: No    Sexually Abused: No    Review of Systems: See HPI, otherwise negative ROS  Physical Exam: BP (!) 146/79   Temp 98.6 F (37 C) (Temporal)   Resp 18   Wt 83.9 kg   SpO2 99%   BMI 31.76 kg/m  General:   Alert, cooperative in NAD Head:  Normocephalic and atraumatic. Respiratory:  Normal work of breathing. Cardiovascular:  RRR  Impression/Plan: JONTEL HARTSHORN is here for cataract surgery.  Risks, benefits, limitations,  and alternatives regarding cataract surgery have been reviewed with the patient.  Questions have been answered.  All parties agreeable.   Willey Blade, MD  12/30/2022, 12:29 PM

## 2022-12-30 NOTE — Anesthesia Postprocedure Evaluation (Signed)
Anesthesia Post Note  Patient: Martha Walsh  Procedure(s) Performed: CATARACT EXTRACTION PHACO AND INTRAOCULAR LENS PLACEMENT (IOC) RIGHT  3.37  00:30.2 (Right: Eye)  Patient location during evaluation: PACU Anesthesia Type: MAC Level of consciousness: awake and alert Pain management: pain level controlled Vital Signs Assessment: post-procedure vital signs reviewed and stable Respiratory status: spontaneous breathing, nonlabored ventilation, respiratory function stable and patient connected to nasal cannula oxygen Cardiovascular status: stable and blood pressure returned to baseline Postop Assessment: no apparent nausea or vomiting Anesthetic complications: no   No notable events documented.   Last Vitals:  Vitals:   12/30/22 1301 12/30/22 1305  BP: 120/74 127/72  Pulse: 66 60  Resp: 14 13  Temp: (!) 36.3 C (!) 36.3 C  SpO2: 96% 96%    Last Pain:  Vitals:   12/30/22 1305  TempSrc:   PainSc: 0-No pain                 Krisandra Bueno C Bright Spielmann

## 2022-12-30 NOTE — Transfer of Care (Signed)
Immediate Anesthesia Transfer of Care Note  Patient: Martha Walsh  Procedure(s) Performed: CATARACT EXTRACTION PHACO AND INTRAOCULAR LENS PLACEMENT (IOC) RIGHT  3.37  00:30.2 (Right: Eye)  Patient Location: PACU  Anesthesia Type: MAC  Level of Consciousness: awake, alert  and patient cooperative  Airway and Oxygen Therapy: Patient Spontanous Breathing and Patient connected to supplemental oxygen  Post-op Assessment: Post-op Vital signs reviewed, Patient's Cardiovascular Status Stable, Respiratory Function Stable, Patent Airway and No signs of Nausea or vomiting  Post-op Vital Signs: Reviewed and stable  Complications: No notable events documented.

## 2022-12-30 NOTE — Op Note (Signed)
OPERATIVE NOTE  Martha Walsh [id number] 12/30/2022   PREOPERATIVE DIAGNOSIS:  Nuclear sclerotic cataract right eye.  H25.11   POSTOPERATIVE DIAGNOSIS:    Nuclear sclerotic cataract right eye.     PROCEDURE:  Phacoemusification with posterior chamber intraocular lens placement of the right eye   LENS:   Implant Name Type Inv. Item Serial No. Manufacturer Lot No. LRB No. Used Action  LENS IOL TECNIS EYHANCE 24.0 - [id number] Intraocular Lens LENS IOL TECNIS EYHANCE 24.0 [id number] [employer]  Right 1 Implanted       Procedure(s): CATARACT EXTRACTION PHACO AND INTRAOCULAR LENS PLACEMENT (IOC) RIGHT  3.37  00:30.2 (Right)  DIB00 +24.0   ULTRASOUND TIME: 0 minutes 30 seconds.  CDE 3.37   SURGEON:  Willey Blade, MD, MPH  ANESTHESIOLOGIST: Anesthesiologist: Marisue Humble, MD CRNA: Bynum, [place], CRNA   ANESTHESIA:  Topical with tetracaine drops augmented with 1% preservative-free intracameral lidocaine.  ESTIMATED BLOOD LOSS: less than 1 mL.   COMPLICATIONS:  None.   DESCRIPTION OF PROCEDURE:  The patient was identified in the holding room and transported to the operating room and placed in the supine position under the operating microscope.  The right eye was identified as the operative eye and it was prepped and draped in the usual sterile ophthalmic fashion.   A 1.0 millimeter clear-corneal paracentesis was made at the 10:30 position. 0.5 ml of preservative-free 1% lidocaine with epinephrine was injected into the anterior chamber.  The anterior chamber was filled with viscoelastic.  A 2.4 millimeter keratome was used to make a near-clear corneal incision at the 8:00 position.  A curvilinear capsulorrhexis was made with a cystotome and capsulorrhexis forceps.  Balanced salt solution was used to hydrodissect and hydrodelineate the nucleus.   Phacoemulsification was then used in stop and chop fashion to remove the lens nucleus and epinucleus.  The remaining cortex was then removed  using the irrigation and aspiration handpiece. Viscoelastic was then placed into the capsular bag to distend it for lens placement.  A lens was then injected into the capsular bag.  The remaining viscoelastic was aspirated.   Wounds were hydrated with balanced salt solution.  The anterior chamber was inflated to a physiologic pressure with balanced salt solution.   Intracameral vigamox 0.1 mL undiluted was injected into the eye and a drop placed onto the ocular surface.  No wound leaks were noted.  The patient was taken to the recovery room in stable condition without complications of anesthesia or surgery  Willey Blade 12/30/2022, 1:00 PM

## 2023-01-01 ENCOUNTER — Encounter: Payer: Self-pay | Admitting: Ophthalmology

## 2023-01-07 DIAGNOSIS — M9905 Segmental and somatic dysfunction of pelvic region: Secondary | ICD-10-CM | POA: Diagnosis not present

## 2023-01-07 DIAGNOSIS — M4306 Spondylolysis, lumbar region: Secondary | ICD-10-CM | POA: Diagnosis not present

## 2023-01-07 DIAGNOSIS — M9903 Segmental and somatic dysfunction of lumbar region: Secondary | ICD-10-CM | POA: Diagnosis not present

## 2023-01-07 DIAGNOSIS — M5416 Radiculopathy, lumbar region: Secondary | ICD-10-CM | POA: Diagnosis not present

## 2023-01-17 ENCOUNTER — Other Ambulatory Visit: Payer: Self-pay | Admitting: Gastroenterology

## 2023-01-23 DIAGNOSIS — H02831 Dermatochalasis of right upper eyelid: Secondary | ICD-10-CM | POA: Diagnosis not present

## 2023-01-23 DIAGNOSIS — H02834 Dermatochalasis of left upper eyelid: Secondary | ICD-10-CM | POA: Diagnosis not present

## 2023-01-28 ENCOUNTER — Telehealth: Payer: Self-pay | Admitting: *Deleted

## 2023-01-28 DIAGNOSIS — Z961 Presence of intraocular lens: Secondary | ICD-10-CM | POA: Diagnosis not present

## 2023-01-28 NOTE — Telephone Encounter (Signed)
Pt called back and we talked about the pre op appt and the appt with Dr. Flora Lipps 03/20/23. Pt opts to just see Dr. Flora Lipps for pre op clearance. I tell the pt that I will update notes to reflect the surgery is 04/10/23 as request came to our office as TBD. Pt thanked me for the help and the call.

## 2023-01-28 NOTE — Telephone Encounter (Signed)
I have s/w the pt about tele pre op appt, but pt was at another doctor's appt and asked if she can call back. I said yes and to ask for the pre op team.   After I hung up, I realized pt has appt with Dr. Flora Lipps 03/20/23 for her 3 month f/u. Pt tells me that her procedure is not until July 18th, which will allow enough time for the pt to see MD for the pre op clearance.   If pt still wants to schedule a tele pre op appt we can, just as long as the pt is made aware her insurance will be billed for 2 appts, one for pre op clearance then her f/u with Dr. Flora Lipps.   We are happy to accommodate the pt to what decision she opts for.

## 2023-01-28 NOTE — Telephone Encounter (Signed)
   Name: Martha Walsh  DOB: 16-Jan-1951  MRN: [id number]  Primary Cardiologist: None   Preoperative team, please contact this patient and set up a phone call appointment for further preoperative risk assessment. Please obtain consent and complete medication review. Thank you for your help.  I confirm that guidance regarding antiplatelet and oral anticoagulation therapy has been completed and, if necessary, noted below.  None requested.    Carlos Levering, NP 01/28/2023, 1:19 PM [facility]

## 2023-01-28 NOTE — Telephone Encounter (Signed)
   Pre-operative Risk Assessment    Patient Name: Martha Walsh  DOB: 1950-10-21 MRN: [id number]      Request for Surgical Clearance    Procedure:   RIGHT TOTAL KNEE ARTHROPLASTY  Date of Surgery:  Clearance TBD                                 Surgeon:  DR. Odis Luster Surgeon's Group or Practice Name:  [facility] Phone number:  [phone] Fax number:  [phone]   Type of Clearance Requested:   - Medical    Type of Anesthesia:   CHOICE   Additional requests/questions:    Wilhemina Cash   01/28/2023, 8:31 AM

## 2023-01-29 ENCOUNTER — Encounter: Payer: Self-pay | Admitting: *Deleted

## 2023-01-29 DIAGNOSIS — Z006 Encounter for examination for normal comparison and control in clinical research program: Secondary | ICD-10-CM

## 2023-01-29 NOTE — Research (Signed)
Message left about V1P research study. Encouraged Ms Patruno to call with any questions.

## 2023-02-10 DIAGNOSIS — L821 Other seborrheic keratosis: Secondary | ICD-10-CM | POA: Diagnosis not present

## 2023-02-10 DIAGNOSIS — D229 Melanocytic nevi, unspecified: Secondary | ICD-10-CM | POA: Diagnosis not present

## 2023-02-10 DIAGNOSIS — Z85828 Personal history of other malignant neoplasm of skin: Secondary | ICD-10-CM | POA: Diagnosis not present

## 2023-02-10 DIAGNOSIS — L814 Other melanin hyperpigmentation: Secondary | ICD-10-CM | POA: Diagnosis not present

## 2023-02-11 DIAGNOSIS — M9905 Segmental and somatic dysfunction of pelvic region: Secondary | ICD-10-CM | POA: Diagnosis not present

## 2023-02-11 DIAGNOSIS — M5416 Radiculopathy, lumbar region: Secondary | ICD-10-CM | POA: Diagnosis not present

## 2023-02-11 DIAGNOSIS — M9903 Segmental and somatic dysfunction of lumbar region: Secondary | ICD-10-CM | POA: Diagnosis not present

## 2023-02-11 DIAGNOSIS — M4306 Spondylolysis, lumbar region: Secondary | ICD-10-CM | POA: Diagnosis not present

## 2023-02-12 DIAGNOSIS — H02834 Dermatochalasis of left upper eyelid: Secondary | ICD-10-CM | POA: Diagnosis not present

## 2023-02-14 ENCOUNTER — Other Ambulatory Visit: Payer: Self-pay | Admitting: Internal Medicine

## 2023-02-14 NOTE — Telephone Encounter (Signed)
Requested medications are due for refill today.  yes  Requested medications are on the active medications list.  yes  Last refill. 11/06/2022 #90 0 rf  Future visit scheduled.   yes  Notes to clinic.  Abnormal labs.    Requested Prescriptions  Pending Prescriptions Disp Refills   levothyroxine (SYNTHROID) 50 MCG tablet [Pharmacy Med Name: Levothyroxine Sodium 50 MCG Oral Tablet] 90 tablet 0    Sig: TAKE 1 TABLET BY MOUTH ONCE DAILY BEFORE BREAKFAST     Endocrinology:  Hypothyroid Agents Failed - 02/14/2023  9:41 AM      Failed - TSH in normal range and within 360 days    TSH  Date Value Ref Range Status  11/04/2022 5.820 (H) 0.450 - 4.500 uIU/mL Final         Passed - Valid encounter within last 12 months    Recent Outpatient Visits           3 months ago Acquired hypothyroidism   [facility], Nyoka Cowden, MD   5 months ago Preop general physical exam   [facility], Nyoka Cowden, MD   8 months ago Calculus of gallbladder without cholecystitis without obstruction   [facility] at [facility], Nyoka Cowden, MD   9 months ago Annual physical exam   [facility] at [facility], Nyoka Cowden, MD   1 year ago UTI symptoms   [facility] at [facility], Nyoka Cowden, MD       Future Appointments             In 1 month O'Neal, Ronnald Ramp, MD [facility]   In 3 months Judithann Graves, Nyoka Cowden, MD [facility] at [facility]

## 2023-02-27 ENCOUNTER — Telehealth: Payer: Self-pay | Admitting: Internal Medicine

## 2023-02-27 NOTE — Telephone Encounter (Signed)
Copied from CRM [id number]. Topic: Medicare AWV >> Feb 27, 2023  3:44 PM Payton Doughty wrote: Reason for CRM: LM 02/27/2023 to schedule AWV   Verlee Rossetti; Care Guide Ambulatory Clinical Support [facility] Direct Dial: [phone]

## 2023-03-04 ENCOUNTER — Encounter: Payer: Self-pay | Admitting: Internal Medicine

## 2023-03-11 DIAGNOSIS — M9903 Segmental and somatic dysfunction of lumbar region: Secondary | ICD-10-CM | POA: Diagnosis not present

## 2023-03-11 DIAGNOSIS — R600 Localized edema: Secondary | ICD-10-CM | POA: Diagnosis not present

## 2023-03-11 DIAGNOSIS — M9905 Segmental and somatic dysfunction of pelvic region: Secondary | ICD-10-CM | POA: Diagnosis not present

## 2023-03-11 DIAGNOSIS — M25561 Pain in right knee: Secondary | ICD-10-CM | POA: Diagnosis not present

## 2023-03-11 DIAGNOSIS — M5416 Radiculopathy, lumbar region: Secondary | ICD-10-CM | POA: Diagnosis not present

## 2023-03-11 DIAGNOSIS — M4306 Spondylolysis, lumbar region: Secondary | ICD-10-CM | POA: Diagnosis not present

## 2023-03-17 NOTE — Progress Notes (Unsigned)
Cardiology Office Note:   Date:  03/20/2023  NAME:  Martha Walsh    MRN: [id number] DOB:  04/22/1951   PCP:  Reubin Milan, MD  Cardiologist:  None  Electrophysiologist:  None   Referring MD: Lyndle Herrlich, MD   Chief Complaint  Patient presents with   Follow-up    History of Present Illness:   Martha Walsh is a 72 y.o. female with a hx of CAD who presents for follow-up.  She reports she is doing well.  No further chest pain episodes.  Nonobstructive CAD on coronary CTA.  She reports she can climb a flight of stairs with limitations.  She will have knee surgery with Dr. Odis Luster.  Reports no chest pain or trouble breathing.  CV exam normal.  EKG normal.  No symptoms of angina.  She is currently on pravastatin and Zetia.  Intolerant to Lipitor and Crestor.  We discussed that we will likely need PCSK9 inhibitor therapy.  She will have repeat labs in August.  Would like to see those labs before we make that decision.  Problem List CAD -CAC 956 (94th percentile) -mid LAD 50-69% (CT FFR 0.94) -mid RCA 25-49%  -LCX abnormal CT FFR due to artifact  2. Aortic regurgitation  -mild to moderate 12/27/2022 3. HLD -T chol 235, HDL 66, LDL 146, TG 133 -Intolerant to Lipitor/Crestor  Past Medical History: Past Medical History:  Diagnosis Date   Anxiety    Arthritis    Coronary artery disease    GERD (gastroesophageal reflux disease)    Hyperlipidemia    Hypothyroid    Motion sickness    Boats    Past Surgical History: Past Surgical History:  Procedure Laterality Date   APPENDECTOMY     CATARACT EXTRACTION W/PHACO Left 11/18/2022   Procedure: CATARACT EXTRACTION PHACO AND INTRAOCULAR LENS PLACEMENT (IOC) LEFT  10.20  00:55.0;  Surgeon: Nevada Crane, MD;  Location: [facility];  Service: Ophthalmology;  Laterality: Left;   CATARACT EXTRACTION W/PHACO Right 12/30/2022   Procedure: CATARACT EXTRACTION PHACO AND INTRAOCULAR LENS PLACEMENT (IOC) RIGHT  3.37  00:30.2;   Surgeon: Nevada Crane, MD;  Location: [facility];  Service: Ophthalmology;  Laterality: Right;   CHOLECYSTECTOMY  09/2022   COLONOSCOPY     ESOPHAGOGASTRODUODENOSCOPY (EGD) WITH PROPOFOL N/A 07/27/2020   Procedure: ESOPHAGOGASTRODUODENOSCOPY (EGD) WITH PROPOFOL;  Surgeon: Midge Minium, MD;  Location: [facility];  Service: Endoscopy;  Laterality: N/A;   JOINT REPLACEMENT Left 02/2016   partial knee   TONSILLECTOMY     TUBAL LIGATION      Current Medications: Current Meds  Medication Sig   acetaminophen (TYLENOL) 500 MG tablet Take 2 tablets (1,000 mg total) by mouth every 6 (six) hours as needed for mild pain.   amoxicillin (AMOXIL) 500 MG tablet TAKE 4 TABLETS BY MOUTH (2 GRAMS) 60 MINUTES BEFORE DENTAL PROCEDURE   atorvastatin (LIPITOR) 40 MG tablet Take 1 tablet (40 mg total) by mouth daily.   Calcium Carbonate-Vitamin D 600-400 MG-UNIT tablet Take 1 tablet by mouth daily.   Cholecalciferol 25 MCG (1000 UT) tablet Take 1,000 Units by mouth daily.   citalopram (CELEXA) 10 MG tablet Take 1 tablet (10 mg total) by mouth daily. (Patient taking differently: Take 10 mg by mouth at bedtime.)   clonazePAM (KLONOPIN) 0.5 MG tablet Take 0.5 mg by mouth as needed for anxiety.   cyclobenzaprine (FLEXERIL) 5 MG tablet Take 5 mg by mouth as needed.   ezetimibe (  ZETIA) 10 MG tablet Take 1 tablet (10 mg total) by mouth daily. (Patient taking differently: Take 10 mg by mouth at bedtime.)   ibuprofen (ADVIL) 800 MG tablet Take 1 tablet (800 mg total) by mouth every 8 (eight) hours as needed for moderate pain.   levothyroxine (SYNTHROID) 50 MCG tablet TAKE 1 TABLET BY MOUTH ONCE DAILY BEFORE BREAKFAST   nitroGLYCERIN (NITROSTAT) 0.4 MG SL tablet Place 1 tablet (0.4 mg total) under the tongue every 5 (five) minutes as needed for chest pain.   Omega-3 Fatty Acids (FISH OIL PO) Take by mouth.   pantoprazole (PROTONIX) 40 MG tablet Take 1 tablet by mouth once daily   potassium chloride  (KLOR-CON) 10 MEQ tablet Take 10 mEq by mouth at bedtime. Every other day for leg cramps OTC     Allergies:    Augmentin [amoxicillin-pot clavulanate], Wound dressing adhesive, Crestor [rosuvastatin], and Lisinopril   Social History: Social History   Socioeconomic History   Marital status: Married    Spouse name: Not on file   Number of children: 3   Years of education: Not on file   Highest education level: Associate degree: academic program  Occupational History   Occupation: retired  Tobacco Use   Smoking status: Former    Types: Cigarettes    Quit date: 09/23/1998    Years since quitting: 24.5    Passive exposure: Past   Smokeless tobacco: Never  Vaping Use   Vaping Use: Never used  Substance and Sexual Activity   Alcohol use: Yes    Alcohol/week: 7.0 standard drinks of alcohol    Types: 7 Glasses of wine per week    Comment: occasionally   Drug use: Never   Sexual activity: Not Currently  Other Topics Concern   Not on file  Social History Narrative   Not on file   Social Determinants of Health   Financial Resource Strain: Low Risk  (03/06/2022)   Overall Financial Resource Strain (CARDIA)    Difficulty of Paying Living Expenses: Not hard at all  Food Insecurity: No Food Insecurity (11/27/2022)   Hunger Vital Sign    Worried About Running Out of Food in the Last Year: Never true    Ran Out of Food in the Last Year: Never true  Transportation Needs: No Transportation Needs (11/27/2022)   PRAPARE - Administrator, Civil Service (Medical): No    Lack of Transportation (Non-Medical): No  Physical Activity: Inactive (03/06/2022)   Exercise Vital Sign    Days of Exercise per Week: 0 days    Minutes of Exercise per Session: 0 min  Stress: No Stress Concern Present (03/06/2022)   [employer] of Occupational Health - Occupational Stress Questionnaire    Feeling of Stress : Not at all  Social Connections: Socially Integrated (03/06/2022)   Social Connection  and Isolation Panel [NHANES]    Frequency of Communication with Friends and Family: More than three times a week    Frequency of Social Gatherings with Friends and Family: More than three times a week    Attends Religious Services: More than 4 times per year    Active Member of [employer] or Organizations: Yes    Attends Engineer, structural: More than 4 times per year    Marital Status: Married     Family History: The patient's family history includes Heart disease in her father.  ROS:   All other ROS reviewed and negative. Pertinent positives noted in the HPI.  EKGs/Labs/Other Studies Reviewed:   The following studies were personally reviewed by me today:  EKG:  EKG is ordered today.    EKG Interpretation Date/Time:  Thursday March 20 2023 14:19:35 EDT Ventricular Rate:  66 PR Interval:  172 QRS Duration:  84 QT Interval:  372 QTC Calculation: 389 R Axis:   -1  Text Interpretation: Normal sinus rhythm Normal ECG Confirmed by Lennie Odor ([id number]) on 03/20/2023 2:25:40 PM   Recent Labs: 05/21/2022: ALT 13 11/04/2022: TSH 5.820 11/25/2022: BUN 23; Creatinine, Ser 0.92; Hemoglobin 11.2; Platelets 200; Potassium 4.2; Sodium 139   Recent Lipid Panel    Component Value Date/Time   CHOL 235 (H) 05/06/2022 0936   TRIG 133 05/06/2022 0936   HDL 66 05/06/2022 0936   CHOLHDL 3.6 05/06/2022 0936   LDLCALC 146 (H) 05/06/2022 0936    Physical Exam:   VS:  BP 132/77 (BP Location: Right Arm, Patient Position: Sitting, Cuff Size: Normal)   Pulse 66   Ht 5\' 4"  (1.626 m)   Wt 194 lb 9.6 oz (88.3 kg)   SpO2 96%   BMI 33.40 kg/m    Wt Readings from Last 3 Encounters:  03/20/23 194 lb 9.6 oz (88.3 kg)  12/30/22 185 lb (83.9 kg)  11/27/22 189 lb (85.7 kg)    General: Well nourished, well developed, in no acute distress Head: Atraumatic, normal size  Eyes: PEERLA, EOMI  Neck: Supple, no JVD Endocrine: No thryomegaly Cardiac: Normal S1, S2; RRR; no murmurs, rubs, or  gallops Lungs: Clear to auscultation bilaterally, no wheezing, rhonchi or rales  Abd: Soft, nontender, no hepatomegaly  Ext: No edema, pulses 2+ Musculoskeletal: No deformities, BUE and BLE strength normal and equal Skin: Warm and dry, no rashes   Neuro: Alert and oriented to person, place, time, and situation, CNII-XII grossly intact, no focal deficits  Psych: Normal mood and affect   ASSESSMENT:   Martha Walsh is a 72 y.o. female who presents for the following: 1. Preoperative cardiovascular examination   2. Coronary artery disease involving native coronary artery of native heart without angina pectoris   3. Agatston coronary artery calcium score greater than 400   4. Mixed hyperlipidemia     PLAN:   1. Preoperative cardiovascular examination -Nonobstructive CAD on coronary CTA.  Echo with normal LV function.  Mild to moderate AI.  No symptoms from this.  She can complete greater than 4 METS.  She may proceed to surgery.  2. Coronary artery disease involving native coronary artery of native heart without angina pectoris 3. Agatston coronary artery calcium score greater than 400 4. Mixed hyperlipidemia -Nonobstructive CAD.  Elevated calcium score.  We discussed that she will start aspirin after her surgery.  She can start a baby aspirin once they let her know is okay.  She is on pravastatin and Zetia.  She is intolerant of Lipitor and Crestor.  We discussed that her current regimen may not get her LDL less than 55.  She will have repeat labs in August.  She will forward me the results.  If her cholesterol is not at goal would recommend PCSK9 inhibitor versus Leqvio.  We will have her meet with pharmacy if needed.      Disposition: Return in about 1 year (around 03/19/2024).  Medication Adjustments/Labs and Tests Ordered: Current medicines are reviewed at length with the patient today.  Concerns regarding medicines are outlined above.  Orders Placed This Encounter  Procedures    EKG 12-Lead   No  orders of the defined types were placed in this encounter.  Patient Instructions  Medication Instructions:  No changes *If you need a refill on your cardiac medications before your next appointment, please call your pharmacy*   Lab Work: Please forward your Cholesterol from your PCP in August If you have labs (blood work) drawn today and your tests are completely normal, you will receive your results only by: MyChart Message (if you have MyChart) OR A paper copy in the mail If you have any lab test that is abnormal or we need to change your treatment, we will call you to review the results.  Follow-Up: At [facility], you and your health needs are our priority.  As part of our continuing mission to provide you with exceptional heart care, we have created designated Provider Care Teams.  These Care Teams include your primary Cardiologist (physician) and Advanced Practice Providers (APPs -  Physician Assistants and Nurse Practitioners) who all work together to provide you with the care you need, when you need it.  We recommend signing up for the patient portal called "MyChart".  Sign up information is provided on this After Visit Summary.  MyChart is used to connect with patients for Virtual Visits (Telemedicine).  Patients are able to view lab/test results, encounter notes, upcoming appointments, etc.  Non-urgent messages can be sent to your provider as well.   To learn more about what you can do with MyChart, go to [email or web address].    Your next appointment:   1 year(s)  Provider:   Dr Flora Lipps    Time Spent with Patient: I have spent a total of 25 minutes with patient reviewing hospital notes, telemetry, EKGs, labs and examining the patient as well as establishing an assessment and plan that was discussed with the patient.  > 50% of time was spent in direct patient care.  Signed, Lenna Gilford. Flora Lipps, MD, [id number] [facility]  [facility]  [address], [address] [place], [place] [address] [phone]  03/20/2023 2:36 PM

## 2023-03-20 ENCOUNTER — Ambulatory Visit: Payer: Medicare Other | Attending: Cardiovascular Disease | Admitting: Cardiovascular Disease

## 2023-03-20 ENCOUNTER — Encounter: Payer: Self-pay | Admitting: Cardiovascular Disease

## 2023-03-20 VITALS — BP 132/77 | HR 66 | Ht 64.0 in | Wt 194.6 lb

## 2023-03-20 DIAGNOSIS — I2510 Atherosclerotic heart disease of native coronary artery without angina pectoris: Secondary | ICD-10-CM | POA: Diagnosis not present

## 2023-03-20 DIAGNOSIS — R931 Abnormal findings on diagnostic imaging of heart and coronary circulation: Secondary | ICD-10-CM | POA: Diagnosis not present

## 2023-03-20 DIAGNOSIS — Z01810 Encounter for preprocedural cardiovascular examination: Secondary | ICD-10-CM

## 2023-03-20 DIAGNOSIS — E782 Mixed hyperlipidemia: Secondary | ICD-10-CM | POA: Diagnosis not present

## 2023-03-20 NOTE — Patient Instructions (Signed)
Medication Instructions:  No changes *If you need a refill on your cardiac medications before your next appointment, please call your pharmacy*   Lab Work: Please forward your Cholesterol from your PCP in August If you have labs (blood work) drawn today and your tests are completely normal, you will receive your results only by: MyChart Message (if you have MyChart) OR A paper copy in the mail If you have any lab test that is abnormal or we need to change your treatment, we will call you to review the results.  Follow-Up: At [facility], you and your health needs are our priority.  As part of our continuing mission to provide you with exceptional heart care, we have created designated Provider Care Teams.  These Care Teams include your primary Cardiologist (physician) and Advanced Practice Providers (APPs -  Physician Assistants and Nurse Practitioners) who all work together to provide you with the care you need, when you need it.  We recommend signing up for the patient portal called "MyChart".  Sign up information is provided on this After Visit Summary.  MyChart is used to connect with patients for Virtual Visits (Telemedicine).  Patients are able to view lab/test results, encounter notes, upcoming appointments, etc.  Non-urgent messages can be sent to your provider as well.   To learn more about what you can do with MyChart, go to [email or web address].    Your next appointment:   1 year(s)  Provider:   Dr Flora Lipps

## 2023-03-24 HISTORY — PX: REPLACEMENT TOTAL KNEE: SUR1224

## 2023-03-31 DIAGNOSIS — Z1231 Encounter for screening mammogram for malignant neoplasm of breast: Secondary | ICD-10-CM | POA: Diagnosis not present

## 2023-03-31 LAB — HM MAMMOGRAPHY

## 2023-04-07 ENCOUNTER — Other Ambulatory Visit: Payer: Self-pay | Admitting: Gastroenterology

## 2023-04-09 DIAGNOSIS — M4306 Spondylolysis, lumbar region: Secondary | ICD-10-CM | POA: Diagnosis not present

## 2023-04-09 DIAGNOSIS — M5416 Radiculopathy, lumbar region: Secondary | ICD-10-CM | POA: Diagnosis not present

## 2023-04-09 DIAGNOSIS — M9905 Segmental and somatic dysfunction of pelvic region: Secondary | ICD-10-CM | POA: Diagnosis not present

## 2023-04-09 DIAGNOSIS — M9903 Segmental and somatic dysfunction of lumbar region: Secondary | ICD-10-CM | POA: Diagnosis not present

## 2023-04-10 DIAGNOSIS — M1711 Unilateral primary osteoarthritis, right knee: Secondary | ICD-10-CM | POA: Diagnosis not present

## 2023-04-10 DIAGNOSIS — I351 Nonrheumatic aortic (valve) insufficiency: Secondary | ICD-10-CM | POA: Diagnosis not present

## 2023-04-10 DIAGNOSIS — G8918 Other acute postprocedural pain: Secondary | ICD-10-CM | POA: Diagnosis not present

## 2023-04-10 DIAGNOSIS — F32A Depression, unspecified: Secondary | ICD-10-CM | POA: Diagnosis not present

## 2023-04-10 DIAGNOSIS — Z87891 Personal history of nicotine dependence: Secondary | ICD-10-CM | POA: Diagnosis not present

## 2023-04-10 DIAGNOSIS — Z9889 Other specified postprocedural states: Secondary | ICD-10-CM | POA: Diagnosis not present

## 2023-04-10 DIAGNOSIS — I2510 Atherosclerotic heart disease of native coronary artery without angina pectoris: Secondary | ICD-10-CM | POA: Diagnosis not present

## 2023-04-10 DIAGNOSIS — K219 Gastro-esophageal reflux disease without esophagitis: Secondary | ICD-10-CM | POA: Diagnosis not present

## 2023-04-10 DIAGNOSIS — E785 Hyperlipidemia, unspecified: Secondary | ICD-10-CM | POA: Diagnosis not present

## 2023-04-10 DIAGNOSIS — E039 Hypothyroidism, unspecified: Secondary | ICD-10-CM | POA: Diagnosis not present

## 2023-04-11 DIAGNOSIS — Z9889 Other specified postprocedural states: Secondary | ICD-10-CM | POA: Diagnosis not present

## 2023-04-11 DIAGNOSIS — E039 Hypothyroidism, unspecified: Secondary | ICD-10-CM | POA: Diagnosis not present

## 2023-04-11 DIAGNOSIS — I351 Nonrheumatic aortic (valve) insufficiency: Secondary | ICD-10-CM | POA: Diagnosis not present

## 2023-04-11 DIAGNOSIS — Z87891 Personal history of nicotine dependence: Secondary | ICD-10-CM | POA: Diagnosis not present

## 2023-04-11 DIAGNOSIS — K219 Gastro-esophageal reflux disease without esophagitis: Secondary | ICD-10-CM | POA: Diagnosis not present

## 2023-04-11 DIAGNOSIS — E785 Hyperlipidemia, unspecified: Secondary | ICD-10-CM | POA: Diagnosis not present

## 2023-04-11 DIAGNOSIS — M1711 Unilateral primary osteoarthritis, right knee: Secondary | ICD-10-CM | POA: Diagnosis not present

## 2023-04-11 DIAGNOSIS — I2510 Atherosclerotic heart disease of native coronary artery without angina pectoris: Secondary | ICD-10-CM | POA: Diagnosis not present

## 2023-04-12 DIAGNOSIS — Z87891 Personal history of nicotine dependence: Secondary | ICD-10-CM | POA: Diagnosis not present

## 2023-04-12 DIAGNOSIS — Z9889 Other specified postprocedural states: Secondary | ICD-10-CM | POA: Diagnosis not present

## 2023-04-12 DIAGNOSIS — M1711 Unilateral primary osteoarthritis, right knee: Secondary | ICD-10-CM | POA: Diagnosis not present

## 2023-04-15 ENCOUNTER — Other Ambulatory Visit: Payer: Self-pay | Admitting: Gastroenterology

## 2023-04-15 DIAGNOSIS — R600 Localized edema: Secondary | ICD-10-CM | POA: Diagnosis not present

## 2023-04-15 DIAGNOSIS — M25561 Pain in right knee: Secondary | ICD-10-CM | POA: Diagnosis not present

## 2023-04-17 DIAGNOSIS — M25561 Pain in right knee: Secondary | ICD-10-CM | POA: Diagnosis not present

## 2023-04-17 DIAGNOSIS — R600 Localized edema: Secondary | ICD-10-CM | POA: Diagnosis not present

## 2023-04-22 DIAGNOSIS — M1711 Unilateral primary osteoarthritis, right knee: Secondary | ICD-10-CM | POA: Diagnosis not present

## 2023-04-22 DIAGNOSIS — R600 Localized edema: Secondary | ICD-10-CM | POA: Diagnosis not present

## 2023-04-22 DIAGNOSIS — M25561 Pain in right knee: Secondary | ICD-10-CM | POA: Diagnosis not present

## 2023-04-24 DIAGNOSIS — R600 Localized edema: Secondary | ICD-10-CM | POA: Diagnosis not present

## 2023-04-24 DIAGNOSIS — M25561 Pain in right knee: Secondary | ICD-10-CM | POA: Diagnosis not present

## 2023-04-29 DIAGNOSIS — R600 Localized edema: Secondary | ICD-10-CM | POA: Diagnosis not present

## 2023-04-29 DIAGNOSIS — M25561 Pain in right knee: Secondary | ICD-10-CM | POA: Diagnosis not present

## 2023-05-01 DIAGNOSIS — M25561 Pain in right knee: Secondary | ICD-10-CM | POA: Diagnosis not present

## 2023-05-01 DIAGNOSIS — R600 Localized edema: Secondary | ICD-10-CM | POA: Diagnosis not present

## 2023-05-06 DIAGNOSIS — M25561 Pain in right knee: Secondary | ICD-10-CM | POA: Diagnosis not present

## 2023-05-06 DIAGNOSIS — R600 Localized edema: Secondary | ICD-10-CM | POA: Diagnosis not present

## 2023-05-08 DIAGNOSIS — R600 Localized edema: Secondary | ICD-10-CM | POA: Diagnosis not present

## 2023-05-08 DIAGNOSIS — M25561 Pain in right knee: Secondary | ICD-10-CM | POA: Diagnosis not present

## 2023-05-12 ENCOUNTER — Encounter: Payer: Medicare Other | Admitting: Internal Medicine

## 2023-05-12 DIAGNOSIS — M9903 Segmental and somatic dysfunction of lumbar region: Secondary | ICD-10-CM | POA: Diagnosis not present

## 2023-05-12 DIAGNOSIS — M4306 Spondylolysis, lumbar region: Secondary | ICD-10-CM | POA: Diagnosis not present

## 2023-05-12 DIAGNOSIS — M9905 Segmental and somatic dysfunction of pelvic region: Secondary | ICD-10-CM | POA: Diagnosis not present

## 2023-05-12 DIAGNOSIS — M5416 Radiculopathy, lumbar region: Secondary | ICD-10-CM | POA: Diagnosis not present

## 2023-05-13 DIAGNOSIS — R600 Localized edema: Secondary | ICD-10-CM | POA: Diagnosis not present

## 2023-05-13 DIAGNOSIS — M25561 Pain in right knee: Secondary | ICD-10-CM | POA: Diagnosis not present

## 2023-05-15 ENCOUNTER — Other Ambulatory Visit: Payer: Self-pay | Admitting: Internal Medicine

## 2023-05-15 DIAGNOSIS — R600 Localized edema: Secondary | ICD-10-CM | POA: Diagnosis not present

## 2023-05-15 DIAGNOSIS — M25561 Pain in right knee: Secondary | ICD-10-CM | POA: Diagnosis not present

## 2023-05-20 DIAGNOSIS — M25561 Pain in right knee: Secondary | ICD-10-CM | POA: Diagnosis not present

## 2023-05-20 DIAGNOSIS — R600 Localized edema: Secondary | ICD-10-CM | POA: Diagnosis not present

## 2023-05-21 DIAGNOSIS — R600 Localized edema: Secondary | ICD-10-CM | POA: Diagnosis not present

## 2023-05-21 DIAGNOSIS — M25561 Pain in right knee: Secondary | ICD-10-CM | POA: Diagnosis not present

## 2023-05-23 DIAGNOSIS — Z96651 Presence of right artificial knee joint: Secondary | ICD-10-CM | POA: Diagnosis not present

## 2023-06-03 DIAGNOSIS — R600 Localized edema: Secondary | ICD-10-CM | POA: Diagnosis not present

## 2023-06-03 DIAGNOSIS — M25561 Pain in right knee: Secondary | ICD-10-CM | POA: Diagnosis not present

## 2023-06-05 DIAGNOSIS — R600 Localized edema: Secondary | ICD-10-CM | POA: Diagnosis not present

## 2023-06-05 DIAGNOSIS — M25561 Pain in right knee: Secondary | ICD-10-CM | POA: Diagnosis not present

## 2023-06-06 ENCOUNTER — Ambulatory Visit (INDEPENDENT_AMBULATORY_CARE_PROVIDER_SITE_OTHER): Payer: Medicare Other | Admitting: Internal Medicine

## 2023-06-06 ENCOUNTER — Encounter: Payer: Self-pay | Admitting: Internal Medicine

## 2023-06-06 VITALS — BP 128/76 | HR 78 | Ht 64.0 in | Wt 193.0 lb

## 2023-06-06 DIAGNOSIS — N1831 Chronic kidney disease, stage 3a: Secondary | ICD-10-CM

## 2023-06-06 DIAGNOSIS — E039 Hypothyroidism, unspecified: Secondary | ICD-10-CM | POA: Diagnosis not present

## 2023-06-06 DIAGNOSIS — K219 Gastro-esophageal reflux disease without esophagitis: Secondary | ICD-10-CM

## 2023-06-06 DIAGNOSIS — Z0000 Encounter for general adult medical examination without abnormal findings: Secondary | ICD-10-CM | POA: Diagnosis not present

## 2023-06-06 DIAGNOSIS — D649 Anemia, unspecified: Secondary | ICD-10-CM | POA: Diagnosis not present

## 2023-06-06 DIAGNOSIS — E782 Mixed hyperlipidemia: Secondary | ICD-10-CM

## 2023-06-06 DIAGNOSIS — F411 Generalized anxiety disorder: Secondary | ICD-10-CM

## 2023-06-06 DIAGNOSIS — M8580 Other specified disorders of bone density and structure, unspecified site: Secondary | ICD-10-CM | POA: Insufficient documentation

## 2023-06-06 MED ORDER — EZETIMIBE 10 MG PO TABS: 10.0000 mg | ORAL_TABLET | Freq: Every evening | ORAL | 1 refills | Status: DC

## 2023-06-06 MED ORDER — CITALOPRAM HYDROBROMIDE 10 MG PO TABS: 10.0000 mg | ORAL_TABLET | Freq: Every evening | ORAL | 1 refills | Status: DC

## 2023-06-06 NOTE — Assessment & Plan Note (Signed)
Monitoring regularly Avoiding NSAIDS

## 2023-06-06 NOTE — Assessment & Plan Note (Signed)
Clinically stable on current regimen with good control of symptoms, No SI or HI. No change in management at this time. Celexa 10 mg.

## 2023-06-06 NOTE — Assessment & Plan Note (Signed)
Reflux symptoms are minimal on current therapy - omeprazole. No red flag signs such as weight loss, n/v, melena

## 2023-06-06 NOTE — Assessment & Plan Note (Addendum)
LDL is  Lab Results  Component Value Date   LDLCALC 146 (H) 05/06/2022   Currently being treated with pravastatin with good compliance and no concerns. She did not tolerate atorvastatin.

## 2023-06-06 NOTE — Progress Notes (Signed)
Date:  06/06/2023   Name:  Martha Walsh   DOB:  07-Nov-1950   MRN:  [id number]   Chief Complaint: Annual Exam Martha Walsh is a 72 y.o. female who presents today for her Complete Annual Exam. She feels well. She reports exercising pt goes to PT. She reports she is sleeping well. Breast complaints none.  Recent right knee replacement - doing well and in PT.  Mammogram: 03/2023 DEXA: 03/2021 normal spine, osteopenia hip [facility]) Colonoscopy: 09/2017 repeat 10 yrs?  Health Maintenance Due  Topic Date Due   Medicare Annual Wellness (AWV)  03/07/2023   COVID-19 Vaccine (4 - 2023-24 season) 05/25/2023    Immunization History  Administered Date(s) Administered   Influenza, High Dose Seasonal PF 06/03/2019   Influenza, Seasonal, Injecte, Preservative Fre 06/06/2010   Influenza,inj,Quad PF,6+ Mos 09/10/2016, 10/14/2017, 06/15/2018   Influenza-Unspecified 06/03/2019, 06/07/2020, 06/17/2022   PFIZER(Purple Top)SARS-COV-2 Vaccination 11/05/2019, 11/26/2019, 09/18/2020   Pneumococcal Conjugate-13 11/22/2013   Pneumococcal Polysaccharide-23 01/16/2015   Tdap 06/06/2010, 07/29/2020   Zoster Recombinant(Shingrix) 10/18/2021, 04/08/2022   Zoster, Live 02/07/2016    Hyperlipidemia This is a chronic problem. The problem is controlled. Pertinent negatives include no chest pain or shortness of breath. Current antihyperlipidemic treatment includes statins.  Gastroesophageal Reflux She complains of heartburn. She reports no abdominal pain, no chest pain, no coughing or no wheezing. This is a recurrent problem. The problem occurs occasionally. Pertinent negatives include no fatigue. She has tried a PPI for the symptoms. The treatment provided significant relief.  Thyroid Problem Presents for follow-up visit. Patient reports no anxiety, constipation, diarrhea, fatigue, palpitations or tremors. The symptoms have been stable. Her past medical history is significant for hyperlipidemia.  Depression         This is a chronic problem.The problem is unchanged.  Associated symptoms include no fatigue and no headaches.  Past treatments include SSRIs - Selective serotonin reuptake inhibitors.  Past medical history includes thyroid problem.     Lab Results  Component Value Date   NA 139 11/25/2022   K 4.2 11/25/2022   CO2 25 11/25/2022   GLUCOSE 105 (H) 11/25/2022   BUN 23 11/25/2022   CREATININE 0.92 11/25/2022   CALCIUM 9.2 11/25/2022   EGFR 58 (L) 11/04/2022   GFRNONAA >60 11/25/2022   Lab Results  Component Value Date   CHOL 235 (H) 05/06/2022   HDL 66 05/06/2022   LDLCALC 146 (H) 05/06/2022   TRIG 133 05/06/2022   CHOLHDL 3.6 05/06/2022   Lab Results  Component Value Date   TSH 5.820 (H) 11/04/2022   No results found for: "HGBA1C" Lab Results  Component Value Date   WBC 5.0 11/25/2022   HGB 11.2 (L) 11/25/2022   HCT 34.1 (L) 11/25/2022   MCV 87.7 11/25/2022   PLT 200 11/25/2022   Lab Results  Component Value Date   ALT 13 05/21/2022   AST 18 05/21/2022   ALKPHOS 69 05/21/2022   BILITOT 0.4 05/21/2022   No results found for: "25OHVITD2", "25OHVITD3", "VD25OH"   Review of Systems  Constitutional:  Negative for chills, fatigue and fever.  HENT:  Negative for congestion, hearing loss, tinnitus, trouble swallowing and voice change.   Eyes:  Negative for visual disturbance.  Respiratory:  Negative for cough, chest tightness, shortness of breath and wheezing.   Cardiovascular:  Negative for chest pain, palpitations and leg swelling.  Gastrointestinal:  Positive for heartburn. Negative for abdominal pain, constipation, diarrhea and vomiting.  Endocrine: Negative for polydipsia and  polyuria.  Genitourinary:  Negative for dysuria, frequency, genital sores, vaginal bleeding and vaginal discharge.  Musculoskeletal:  Positive for arthralgias. Negative for gait problem and joint swelling.  Skin:  Negative for color change and rash.  Neurological:  Negative for dizziness,  tremors, light-headedness and headaches.  Hematological:  Negative for adenopathy. Does not bruise/bleed easily.  Psychiatric/Behavioral:  Positive for depression. Negative for dysphoric mood and sleep disturbance. The patient is not nervous/anxious.     Patient Active Problem List   Diagnosis Date Noted   Osteopenia determined by x-ray 06/06/2023   Chronic kidney disease, stage 3a (HCC) 11/04/2022   Low back pain 10/05/2021   History of nonmelanoma skin cancer 12/04/2020   Generalized anxiety disorder 06/04/2019   Status post left partial knee replacement 03/12/2016   Acquired hypothyroidism 02/07/2016   Atypical mole 11/22/2015   Gastro-esophageal reflux disease without esophagitis 08/04/2015   Nocturnal leg cramps 11/22/2013   Obesity (BMI 30-39.9) 11/22/2013   Mixed hyperlipidemia 09/28/2013    Allergies  Allergen Reactions   Atorvastatin Other (See Comments)   Augmentin [Amoxicillin-Pot Clavulanate] Nausea Only    Abdominal Pain    Rosuvastatin Other (See Comments)    Severe Neuropathy   Wound Dressing Adhesive Itching and Rash    "Beet red" at dressing site after knee replacement   Clavulanic Acid Other (See Comments)    Abdominal pain   Lisinopril Cough         Past Surgical History:  Procedure Laterality Date   APPENDECTOMY     CATARACT EXTRACTION W/PHACO Left 11/18/2022   Procedure: CATARACT EXTRACTION PHACO AND INTRAOCULAR LENS PLACEMENT (IOC) LEFT  10.20  00:55.0;  Surgeon: Nevada Crane, MD;  Location: [facility];  Service: Ophthalmology;  Laterality: Left;   CATARACT EXTRACTION W/PHACO Right 12/30/2022   Procedure: CATARACT EXTRACTION PHACO AND INTRAOCULAR LENS PLACEMENT (IOC) RIGHT  3.37  00:30.2;  Surgeon: Nevada Crane, MD;  Location: [facility];  Service: Ophthalmology;  Laterality: Right;   CHOLECYSTECTOMY  09/2022   COLONOSCOPY     ESOPHAGOGASTRODUODENOSCOPY (EGD) WITH PROPOFOL N/A 07/27/2020   Procedure:  ESOPHAGOGASTRODUODENOSCOPY (EGD) WITH PROPOFOL;  Surgeon: Midge Minium, MD;  Location: [facility];  Service: Endoscopy;  Laterality: N/A;   JOINT REPLACEMENT Left 02/2016   partial knee   REPLACEMENT TOTAL KNEE Right 03/2023   TONSILLECTOMY     TUBAL LIGATION      Social History   Tobacco Use   Smoking status: Former    Current packs/day: 0.00    Types: Cigarettes    Quit date: 09/23/1998    Years since quitting: 24.7    Passive exposure: Past   Smokeless tobacco: Never  Vaping Use   Vaping status: Never Used  Substance Use Topics   Alcohol use: Yes    Alcohol/week: 7.0 standard drinks of alcohol    Types: 7 Glasses of wine per week    Comment: occasionally   Drug use: Never     Medication list has been reviewed and updated.  Current Meds  Medication Sig   acetaminophen (TYLENOL) 500 MG tablet Take 2 tablets (1,000 mg total) by mouth every 6 (six) hours as needed for mild pain.   amoxicillin (AMOXIL) 500 MG tablet TAKE 4 TABLETS BY MOUTH (2 GRAMS) 60 MINUTES BEFORE DENTAL PROCEDURE   Calcium Carbonate-Vitamin D 600-400 MG-UNIT tablet Take 1 tablet by mouth daily.   Cholecalciferol 25 MCG (1000 UT) tablet Take 1,000 Units by mouth daily.   clonazePAM (KLONOPIN) 0.5  MG tablet Take 0.5 mg by mouth as needed for anxiety.   cyclobenzaprine (FLEXERIL) 5 MG tablet Take 5 mg by mouth as needed.   ibuprofen (ADVIL) 800 MG tablet Take 1 tablet (800 mg total) by mouth every 8 (eight) hours as needed for moderate pain.   levothyroxine (SYNTHROID) 50 MCG tablet TAKE 1 TABLET BY MOUTH ONCE DAILY BEFORE BREAKFAST   nitroGLYCERIN (NITROSTAT) 0.4 MG SL tablet Place 1 tablet (0.4 mg total) under the tongue every 5 (five) minutes as needed for chest pain.   Omega-3 Fatty Acids (FISH OIL PO) Take by mouth.   pantoprazole (PROTONIX) 40 MG tablet Take 1 tablet (40 mg total) by mouth daily. MUST SCHEDULE OFFICE VISIT   potassium chloride (KLOR-CON) 10 MEQ tablet Take 10 mEq by mouth at  bedtime. Every other day for leg cramps OTC   pravastatin (PRAVACHOL) 80 MG tablet Take 80 mg by mouth daily.   [DISCONTINUED] atorvastatin (LIPITOR) 40 MG tablet Take 1 tablet (40 mg total) by mouth daily.   [DISCONTINUED] citalopram (CELEXA) 10 MG tablet Take 1 tablet (10 mg total) by mouth daily. (Patient taking differently: Take 10 mg by mouth at bedtime.)   [DISCONTINUED] ezetimibe (ZETIA) 10 MG tablet Take 1 tablet (10 mg total) by mouth daily. (Patient taking differently: Take 10 mg by mouth at bedtime.)       06/06/2023    9:27 AM 11/04/2022    9:26 AM 09/10/2022   10:33 AM 05/21/2022    2:11 PM  GAD 7 : Generalized Anxiety Score  Nervous, Anxious, on Edge 0 0 0 0  Control/stop worrying 0 0 0 0  Worry too much - different things 0 0 0 0  Trouble relaxing 0 0 0 0  Restless 0 0 0 0  Easily annoyed or irritable 0 0 0 0  Afraid - awful might happen 0 0 0 0  Total GAD 7 Score 0 0 0 0  Anxiety Difficulty Not difficult at all Not difficult at all Not difficult at all Not difficult at all       06/06/2023    9:26 AM 11/04/2022    9:26 AM 09/10/2022   10:32 AM  Depression screen PHQ 2/9  Decreased Interest 0 0 0  Down, Depressed, Hopeless 0 0 0  PHQ - 2 Score 0 0 0  Altered sleeping 0 0 0  Tired, decreased energy 0 0 0  Change in appetite 0 0 0  Feeling bad or failure about yourself  0 0 0  Trouble concentrating 0 0 0  Moving slowly or fidgety/restless 0 0 0  Suicidal thoughts 0 0 0  PHQ-9 Score 0 0 0  Difficult doing work/chores Not difficult at all Not difficult at all Not difficult at all    BP Readings from Last 3 Encounters:  06/06/23 128/76  03/20/23 132/77  12/30/22 127/72    Physical Exam Vitals and nursing note reviewed.  Constitutional:      General: She is not in acute distress.    Appearance: She is well-developed.  HENT:     Head: Normocephalic and atraumatic.     Right Ear: Tympanic membrane and ear canal normal.     Left Ear: Tympanic membrane and  ear canal normal.     Nose:     Right Sinus: No maxillary sinus tenderness.     Left Sinus: No maxillary sinus tenderness.  Eyes:     General: No scleral icterus.       Right  eye: No discharge.        Left eye: No discharge.     Conjunctiva/sclera: Conjunctivae normal.  Neck:     Thyroid: No thyromegaly.     Vascular: No carotid bruit.  Cardiovascular:     Rate and Rhythm: Normal rate and regular rhythm.     Pulses: Normal pulses.     Heart sounds: Normal heart sounds.  Pulmonary:     Effort: Pulmonary effort is normal. No respiratory distress.     Breath sounds: No wheezing.  Chest:  Breasts:    Right: No mass, nipple discharge, skin change or tenderness.     Left: No mass, nipple discharge, skin change or tenderness.  Abdominal:     General: Bowel sounds are normal.     Palpations: Abdomen is soft.     Tenderness: There is no abdominal tenderness.  Musculoskeletal:     Cervical back: Normal range of motion. No erythema.     Left knee: Swelling present. No erythema. Decreased range of motion.     Right lower leg: No edema.     Left lower leg: No edema.  Lymphadenopathy:     Cervical: No cervical adenopathy.  Skin:    General: Skin is warm and dry.     Findings: No rash.  Neurological:     Mental Status: She is alert and oriented to person, place, and time.     Cranial Nerves: No cranial nerve deficit.     Sensory: No sensory deficit.     Deep Tendon Reflexes: Reflexes are normal and symmetric.  Psychiatric:        Attention and Perception: Attention normal.        Mood and Affect: Mood normal.     Wt Readings from Last 3 Encounters:  06/06/23 193 lb (87.5 kg)  03/20/23 194 lb 9.6 oz (88.3 kg)  12/30/22 185 lb (83.9 kg)    BP 128/76   Pulse 78   Ht 5\' 4"  (1.626 m)   Wt 193 lb (87.5 kg)   SpO2 98%   BMI 33.13 kg/m   Assessment and Plan:  Problem List Items Addressed This Visit       Unprioritized   Osteopenia determined by x-ray   Relevant Orders    VITAMIN D 25 Hydroxy (Vit-D Deficiency, Fractures)   DG Bone Density   Mixed hyperlipidemia (Chronic)    LDL is  Lab Results  Component Value Date   LDLCALC 146 (H) 05/06/2022   Currently being treated with pravastatin with good compliance and no concerns. She did not tolerate atorvastatin.       Relevant Medications   ezetimibe (ZETIA) 10 MG tablet   pravastatin (PRAVACHOL) 80 MG tablet   Other Relevant Orders   Lipid panel   Generalized anxiety disorder (Chronic)    Clinically stable on current regimen with good control of symptoms, No SI or HI. No change in management at this time. Celexa 10 mg.      Relevant Medications   citalopram (CELEXA) 10 MG tablet   Gastro-esophageal reflux disease without esophagitis (Chronic)    Reflux symptoms are minimal on current therapy - omeprazole. No red flag signs such as weight loss, n/v, melena       Relevant Orders   CBC with Differential/Platelet   Chronic kidney disease, stage 3a (HCC) (Chronic)    Monitoring regularly Avoiding NSAIDS      Relevant Orders   Comprehensive metabolic panel   Acquired hypothyroidism (Chronic)  Supplemented Lab Results  Component Value Date   TSH 5.820 (H) 11/04/2022  T4 normal.      Relevant Orders   TSH + free T4   Other Visit Diagnoses     Annual physical exam    -  Primary   up to date on screenings       Return in about 6 months (around 12/04/2023) for anxiety, renal function.    Reubin Milan, MD [facility] [place]

## 2023-06-06 NOTE — Assessment & Plan Note (Addendum)
Supplemented Lab Results  Component Value Date   TSH 5.820 (H) 11/04/2022  T4 normal.

## 2023-06-07 LAB — COMPREHENSIVE METABOLIC PANEL
ALT: 17 IU/L (ref 0–32)
AST: 18 IU/L (ref 0–40)
Albumin: 4.2 g/dL (ref 3.8–4.8)
Alkaline Phosphatase: 80 IU/L (ref 44–121)
BUN/Creatinine Ratio: 23 (ref 12–28)
BUN: 24 mg/dL (ref 8–27)
Bilirubin Total: <0.2 mg/dL (ref 0.0–1.2)
CO2: 22 mmol/L (ref 20–29)
Calcium: 9.4 mg/dL (ref 8.7–10.3)
Chloride: 107 mmol/L — ABNORMAL HIGH (ref 96–106)
Creatinine, Ser: 1.06 mg/dL — ABNORMAL HIGH (ref 0.57–1.00)
Globulin, Total: 2 g/dL (ref 1.5–4.5)
Glucose: 93 mg/dL (ref 70–99)
Potassium: 4.7 mmol/L (ref 3.5–5.2)
Sodium: 142 mmol/L (ref 134–144)
Total Protein: 6.2 g/dL (ref 6.0–8.5)
eGFR: 55 mL/min/{1.73_m2} — ABNORMAL LOW (ref >59)

## 2023-06-07 LAB — CBC WITH DIFFERENTIAL/PLATELET
Basophils Absolute: 0 10*3/uL (ref 0.0–0.2)
Basos: 1 %
EOS (ABSOLUTE): 0.3 10*3/uL (ref 0.0–0.4)
Eos: 5 %
Hematocrit: 31.9 % — ABNORMAL LOW (ref 34.0–46.6)
Hemoglobin: 10.5 g/dL — ABNORMAL LOW (ref 11.1–15.9)
Immature Grans (Abs): 0 10*3/uL (ref 0.0–0.1)
Immature Granulocytes: 0 %
Lymphocytes Absolute: 1.1 10*3/uL (ref 0.7–3.1)
Lymphs: 23 %
MCH: 28.5 pg (ref 26.6–33.0)
MCHC: 32.9 g/dL (ref 31.5–35.7)
MCV: 86 fL (ref 79–97)
Monocytes Absolute: 0.4 10*3/uL (ref 0.1–0.9)
Monocytes: 9 %
Neutrophils Absolute: 2.9 10*3/uL (ref 1.4–7.0)
Neutrophils: 62 %
Platelets: 237 10*3/uL (ref 150–450)
RBC: 3.69 x10E6/uL — ABNORMAL LOW (ref 3.77–5.28)
RDW: 13.1 % (ref 11.7–15.4)
WBC: 4.7 10*3/uL (ref 3.4–10.8)

## 2023-06-07 LAB — TSH+FREE T4
Free T4: 1.18 ng/dL (ref 0.82–1.77)
TSH: 3.11 u[IU]/mL (ref 0.450–4.500)

## 2023-06-07 LAB — LIPID PANEL
Chol/HDL Ratio: 3.6 ratio (ref 0.0–4.4)
Cholesterol, Total: 211 mg/dL — ABNORMAL HIGH (ref 100–199)
HDL: 58 mg/dL (ref >39)
LDL Chol Calc (NIH): 129 mg/dL — ABNORMAL HIGH (ref 0–99)
Triglycerides: 136 mg/dL (ref 0–149)
VLDL Cholesterol Cal: 24 mg/dL (ref 5–40)

## 2023-06-07 LAB — VITAMIN D 25 HYDROXY (VIT D DEFICIENCY, FRACTURES): Vit D, 25-Hydroxy: 47.5 ng/mL (ref 30.0–100.0)

## 2023-06-09 DIAGNOSIS — M9903 Segmental and somatic dysfunction of lumbar region: Secondary | ICD-10-CM | POA: Diagnosis not present

## 2023-06-09 DIAGNOSIS — M9905 Segmental and somatic dysfunction of pelvic region: Secondary | ICD-10-CM | POA: Diagnosis not present

## 2023-06-09 DIAGNOSIS — M5416 Radiculopathy, lumbar region: Secondary | ICD-10-CM | POA: Diagnosis not present

## 2023-06-09 DIAGNOSIS — M4306 Spondylolysis, lumbar region: Secondary | ICD-10-CM | POA: Diagnosis not present

## 2023-06-10 DIAGNOSIS — M25561 Pain in right knee: Secondary | ICD-10-CM | POA: Diagnosis not present

## 2023-06-10 DIAGNOSIS — R600 Localized edema: Secondary | ICD-10-CM | POA: Diagnosis not present

## 2023-06-11 NOTE — Progress Notes (Signed)
Labs added D64.9.  KP

## 2023-06-12 DIAGNOSIS — M25561 Pain in right knee: Secondary | ICD-10-CM | POA: Diagnosis not present

## 2023-06-12 DIAGNOSIS — R600 Localized edema: Secondary | ICD-10-CM | POA: Diagnosis not present

## 2023-06-18 ENCOUNTER — Encounter: Payer: Self-pay | Admitting: Internal Medicine

## 2023-06-19 DIAGNOSIS — M25561 Pain in right knee: Secondary | ICD-10-CM | POA: Diagnosis not present

## 2023-06-19 DIAGNOSIS — R600 Localized edema: Secondary | ICD-10-CM | POA: Diagnosis not present

## 2023-06-21 LAB — IRON AND TIBC
Iron Saturation: 15 % (ref 15–55)
Iron: 45 ug/dL (ref 27–139)
Total Iron Binding Capacity: 292 ug/dL (ref 250–450)
UIBC: 247 ug/dL (ref 118–369)

## 2023-06-21 LAB — FERRITIN: Ferritin: 55 ng/mL (ref 15–150)

## 2023-06-21 LAB — SPECIMEN STATUS REPORT

## 2023-06-30 DIAGNOSIS — M79671 Pain in right foot: Secondary | ICD-10-CM | POA: Diagnosis not present

## 2023-07-02 ENCOUNTER — Ambulatory Visit
Admission: RE | Admit: 2023-07-02 | Discharge: 2023-07-02 | Disposition: A | Discharge status: Home / Self Care | Payer: Medicare Other | Source: Ambulatory Visit | Attending: Internal Medicine | Admitting: Internal Medicine

## 2023-07-02 DIAGNOSIS — Z13820 Encounter for screening for osteoporosis: Secondary | ICD-10-CM | POA: Diagnosis not present

## 2023-07-02 DIAGNOSIS — M85851 Other specified disorders of bone density and structure, right thigh: Secondary | ICD-10-CM | POA: Diagnosis not present

## 2023-07-02 DIAGNOSIS — M8580 Other specified disorders of bone density and structure, unspecified site: Secondary | ICD-10-CM | POA: Diagnosis present

## 2023-07-02 DIAGNOSIS — Z780 Asymptomatic menopausal state: Secondary | ICD-10-CM | POA: Diagnosis not present

## 2023-07-02 DIAGNOSIS — Z96653 Presence of artificial knee joint, bilateral: Secondary | ICD-10-CM | POA: Insufficient documentation

## 2023-07-04 ENCOUNTER — Other Ambulatory Visit: Payer: Self-pay | Admitting: Gastroenterology

## 2023-07-04 DIAGNOSIS — M25561 Pain in right knee: Secondary | ICD-10-CM | POA: Diagnosis not present

## 2023-07-04 DIAGNOSIS — R600 Localized edema: Secondary | ICD-10-CM | POA: Diagnosis not present

## 2023-07-08 ENCOUNTER — Other Ambulatory Visit: Payer: Self-pay | Admitting: Internal Medicine

## 2023-07-08 DIAGNOSIS — M9905 Segmental and somatic dysfunction of pelvic region: Secondary | ICD-10-CM | POA: Diagnosis not present

## 2023-07-08 DIAGNOSIS — M5416 Radiculopathy, lumbar region: Secondary | ICD-10-CM | POA: Diagnosis not present

## 2023-07-08 DIAGNOSIS — M4306 Spondylolysis, lumbar region: Secondary | ICD-10-CM | POA: Diagnosis not present

## 2023-07-08 DIAGNOSIS — M9903 Segmental and somatic dysfunction of lumbar region: Secondary | ICD-10-CM | POA: Diagnosis not present

## 2023-07-11 ENCOUNTER — Other Ambulatory Visit: Payer: Self-pay | Admitting: Surgery

## 2023-07-23 ENCOUNTER — Other Ambulatory Visit: Payer: Self-pay | Admitting: Gastroenterology

## 2023-07-24 ENCOUNTER — Telehealth: Payer: Self-pay | Admitting: Gastroenterology

## 2023-07-24 MED ORDER — PANTOPRAZOLE SODIUM 40 MG PO TBEC: 40.0000 mg | DELAYED_RELEASE_TABLET | Freq: Every day | ORAL | 0 refills | Status: DC

## 2023-07-24 NOTE — Addendum Note (Signed)
Addended by: Roena Malady on: 07/24/2023 01:46 PM   Modules accepted: Orders

## 2023-07-24 NOTE — Telephone Encounter (Signed)
Patient called in to schedule appointment with Dr. Servando Snare. Patient appointment is on 08/26/23 in [place] and she pharmacy is [facility] in [place].

## 2023-07-29 DIAGNOSIS — M25561 Pain in right knee: Secondary | ICD-10-CM | POA: Diagnosis not present

## 2023-08-05 DIAGNOSIS — M4306 Spondylolysis, lumbar region: Secondary | ICD-10-CM | POA: Diagnosis not present

## 2023-08-05 DIAGNOSIS — M9903 Segmental and somatic dysfunction of lumbar region: Secondary | ICD-10-CM | POA: Diagnosis not present

## 2023-08-05 DIAGNOSIS — M5416 Radiculopathy, lumbar region: Secondary | ICD-10-CM | POA: Diagnosis not present

## 2023-08-05 DIAGNOSIS — M9905 Segmental and somatic dysfunction of pelvic region: Secondary | ICD-10-CM | POA: Diagnosis not present

## 2023-08-09 ENCOUNTER — Other Ambulatory Visit: Payer: Self-pay | Admitting: Internal Medicine

## 2023-08-11 NOTE — Telephone Encounter (Signed)
Requested by interface surescripts. Future visit in 3 months.  Requested Prescriptions  Pending Prescriptions Disp Refills   levothyroxine (SYNTHROID) 50 MCG tablet [Pharmacy Med Name: Levothyroxine Sodium 50 MCG Oral Tablet] 90 tablet 0    Sig: TAKE 1 TABLET BY MOUTH ONCE DAILY BEFORE BREAKFAST     Endocrinology:  Hypothyroid Agents Passed - 08/09/2023 10:58 AM      Passed - TSH in normal range and within 360 days    TSH  Date Value Ref Range Status  06/06/2023 3.110 0.450 - 4.500 uIU/mL Final         Passed - Valid encounter within last 12 months    Recent Outpatient Visits           2 months ago Annual physical exam   [place] [facility] at [facility], Nyoka Cowden, MD   9 months ago Acquired hypothyroidism   [place] [facility] at [facility], Nyoka Cowden, MD   11 months ago Preop general physical exam   [facility] at [facility], Nyoka Cowden, MD   1 year ago Calculus of gallbladder without cholecystitis without obstruction   [facility] at [facility], Nyoka Cowden, MD   1 year ago Annual physical exam   [facility] at [facility], Nyoka Cowden, MD       Future Appointments             In 2 weeks Midge Minium, MD [facility] at [facility]   In 3 months Judithann Graves, Nyoka Cowden, MD [facility] at [facility], [facility]

## 2023-08-25 DIAGNOSIS — Z96651 Presence of right artificial knee joint: Secondary | ICD-10-CM | POA: Diagnosis not present

## 2023-08-26 ENCOUNTER — Encounter: Payer: Self-pay | Admitting: Gastroenterology

## 2023-08-26 ENCOUNTER — Ambulatory Visit: Payer: Medicare Other | Admitting: Gastroenterology

## 2023-08-26 VITALS — BP 122/75 | HR 69 | Temp 98.0°F | Wt 196.0 lb

## 2023-08-26 DIAGNOSIS — Z860101 Personal history of adenomatous and serrated colon polyps: Secondary | ICD-10-CM | POA: Diagnosis not present

## 2023-08-26 DIAGNOSIS — R194 Change in bowel habit: Secondary | ICD-10-CM | POA: Diagnosis not present

## 2023-08-26 DIAGNOSIS — K219 Gastro-esophageal reflux disease without esophagitis: Secondary | ICD-10-CM | POA: Diagnosis not present

## 2023-08-26 MED ORDER — PANTOPRAZOLE SODIUM 40 MG PO TBEC: 40.0000 mg | DELAYED_RELEASE_TABLET | Freq: Every day | ORAL | 3 refills | Status: DC

## 2023-08-26 MED ORDER — PEG 3350-KCL-NABCB-NACL-NASULF 236 G PO SOLR: 4000.0000 mL | Freq: Once | ORAL | 0 refills | Status: AC

## 2023-08-26 NOTE — Progress Notes (Signed)
Primary Care Physician: Reubin Milan, MD  Primary Gastroenterologist:  Dr. Midge Minium  Chief Complaint  Patient presents with   Follow-up   Gastroesophageal Reflux    HPI: Martha Walsh is a 72 y.o. female here for refill of her medication.  The patient has been taking pantoprazole.  The patient had last seen me in April 2023 and had been reporting that she had been having loose stools and was gassy.  The patient was told to avoid milk products for 1 week and see if that helps her stools and if they did not that she would be considered for a colonoscopy to investigate her change in bowel habits. She reports the feeling of incomplete evacuation for the last few months.  The patient reports that the feeling of incomplete evacuation has only been going on for the last few months.  She reports that the diarrhea she had before has resolved.  She now reports that she just feels like she is not completely emptying after she goes to the bathroom.  The patient did have a adenomatous polyp in 2014 but states that her colonoscopy in 2019 was negative therefore she was told she does not need another colonoscopy unless she is having problems.  Past Medical History:  Diagnosis Date   Anxiety    Arthritis    Coronary artery disease    GERD (gastroesophageal reflux disease)    Hyperlipidemia    Hypothyroid    Motion sickness    Boats   Primary osteoarthritis of right knee 12/01/2020   S/p TKA  2024      Current Outpatient Medications  Medication Sig Dispense Refill   acetaminophen (TYLENOL) 500 MG tablet Take 2 tablets (1,000 mg total) by mouth every 6 (six) hours as needed for mild pain.     Calcium Carbonate-Vitamin D 600-400 MG-UNIT tablet Take 1 tablet by mouth daily.     Cholecalciferol 25 MCG (1000 UT) tablet Take 1,000 Units by mouth daily.     citalopram (CELEXA) 10 MG tablet Take 1 tablet (10 mg total) by mouth at bedtime. 90 tablet 1   clonazePAM (KLONOPIN) 0.5 MG tablet Take  0.5 mg by mouth as needed for anxiety.     cyclobenzaprine (FLEXERIL) 5 MG tablet Take 5 mg by mouth as needed.     ezetimibe (ZETIA) 10 MG tablet Take 1 tablet (10 mg total) by mouth at bedtime. 90 tablet 1   gabapentin (NEURONTIN) 300 MG capsule Take 1 capsule by mouth 3 (three) times daily.     ibuprofen (ADVIL) 800 MG tablet Take 1 tablet (800 mg total) by mouth every 8 (eight) hours as needed for moderate pain. 60 tablet 1   levothyroxine (SYNTHROID) 50 MCG tablet TAKE 1 TABLET BY MOUTH ONCE DAILY BEFORE BREAKFAST 90 tablet 0   nitroGLYCERIN (NITROSTAT) 0.4 MG SL tablet Place 1 tablet (0.4 mg total) under the tongue every 5 (five) minutes as needed for chest pain. 30 tablet 1   Omega-3 Fatty Acids (FISH OIL PO) Take by mouth.     pantoprazole (PROTONIX) 40 MG tablet Take 1 tablet (40 mg total) by mouth daily. 90 tablet 0   potassium chloride (KLOR-CON) 10 MEQ tablet Take 10 mEq by mouth at bedtime. Every other day for leg cramps OTC     pravastatin (PRAVACHOL) 80 MG tablet Take 1 tablet by mouth once daily 90 tablet 3   amoxicillin (AMOXIL) 500 MG tablet TAKE 4 TABLETS BY MOUTH (2 GRAMS) 60  MINUTES BEFORE DENTAL PROCEDURE (Patient not taking: Reported on 08/26/2023)     No current facility-administered medications for this visit.    Allergies as of 08/26/2023 - Review Complete 08/26/2023  Allergen Reaction Noted   Atorvastatin Other (See Comments) 06/06/2023   Augmentin [amoxicillin-pot clavulanate] Nausea Only 11/22/2013   Rosuvastatin Other (See Comments) 12/09/2022   Wound dressing adhesive Itching and Rash 09/18/2022   Clavulanic acid Other (See Comments) 01/14/2022   Lisinopril Cough 09/10/2016    ROS:  General: Negative for anorexia, weight loss, fever, chills, fatigue, weakness. ENT: Negative for hoarseness, difficulty swallowing , nasal congestion. CV: Negative for chest pain, angina, palpitations, dyspnea on exertion, peripheral edema.  Respiratory: Negative for dyspnea at  rest, dyspnea on exertion, cough, sputum, wheezing.  GI: See history of present illness. GU:  Negative for dysuria, hematuria, urinary incontinence, urinary frequency, nocturnal urination.  Endo: Negative for unusual weight change.    Physical Examination:   There were no vitals taken for this visit.  General: Well-nourished, well-developed in no acute distress.  Eyes: No icterus. Conjunctivae pink. Neuro: Alert and oriented x 3.  Grossly intact. Skin: Warm and dry, no jaundice.   Psych: Alert and cooperative, normal mood and affect.  Labs:    Imaging Studies: No results found.  Assessment and Plan:   Martha Walsh is a 72 y.o. y/o female who comes in today with a need for refill of her pantoprazole and that will be sent in.  The patient also has a change in bowel habits with the feeling of incomplete evacuation when she moves her bowels.  The patient will be set up for colonoscopy due to her change in bowel habits and she did have a history of an adenomatous polyp back in 2014.  The patient has been explained the plan and agrees with it.     Midge Minium, MD. Clementeen Graham    Note: This dictation was prepared with Dragon dictation along with smaller phrase technology. Any transcriptional errors that result from this process are unintentional.

## 2023-08-26 NOTE — Addendum Note (Signed)
Addended by: Roena Malady on: 08/26/2023 03:38 PM   Modules accepted: Orders

## 2023-08-27 NOTE — Addendum Note (Signed)
Addended by: Roena Malady on: 08/27/2023 05:43 PM   Modules accepted: Orders

## 2023-09-01 DIAGNOSIS — M5416 Radiculopathy, lumbar region: Secondary | ICD-10-CM | POA: Diagnosis not present

## 2023-09-01 DIAGNOSIS — M9903 Segmental and somatic dysfunction of lumbar region: Secondary | ICD-10-CM | POA: Diagnosis not present

## 2023-09-01 DIAGNOSIS — M9905 Segmental and somatic dysfunction of pelvic region: Secondary | ICD-10-CM | POA: Diagnosis not present

## 2023-09-01 DIAGNOSIS — M4306 Spondylolysis, lumbar region: Secondary | ICD-10-CM | POA: Diagnosis not present

## 2023-09-05 ENCOUNTER — Ambulatory Visit: Payer: Medicare Other | Admitting: Podiatry

## 2023-09-05 ENCOUNTER — Encounter: Payer: Self-pay | Admitting: Podiatry

## 2023-09-05 VITALS — Ht 64.0 in | Wt 196.0 lb

## 2023-09-05 DIAGNOSIS — M778 Other enthesopathies, not elsewhere classified: Secondary | ICD-10-CM

## 2023-09-05 MED ORDER — BETAMETHASONE SOD PHOS & ACET 6 (3-3) MG/ML IJ SUSP
3.0000 mg | Freq: Once | INTRAMUSCULAR | Status: AC
Start: 2023-09-05 — End: 2023-09-05
  Administered 2023-09-05: 3 mg via INTRA_ARTICULAR

## 2023-09-05 NOTE — Progress Notes (Signed)
Chief Complaint  Patient presents with   Foot Pain    Patient is here for bilateral foot pain    HPI: 72 y.o. female presenting today for a new complaint of pain and tenderness associated to the bilateral forefoot.  Patient recently had right knee replacement this past summer and since that time she has developed some pain and tenderness associated to the bilateral forefoot.  No history of injury.  She says that when she wears her tennis shoes she is essentially asymptomatic and has no pain.  Past Medical History:  Diagnosis Date   Anxiety    Arthritis    Coronary artery disease    GERD (gastroesophageal reflux disease)    Hyperlipidemia    Hypothyroid    Motion sickness    Boats   Primary osteoarthritis of right knee 12/01/2020   S/p TKA  2024      Past Surgical History:  Procedure Laterality Date   APPENDECTOMY     CATARACT EXTRACTION W/PHACO Left 11/18/2022   Procedure: CATARACT EXTRACTION PHACO AND INTRAOCULAR LENS PLACEMENT (IOC) LEFT  10.20  00:55.0;  Surgeon: Nevada Crane, MD;  Location: [facility];  Service: Ophthalmology;  Laterality: Left;   CATARACT EXTRACTION W/PHACO Right 12/30/2022   Procedure: CATARACT EXTRACTION PHACO AND INTRAOCULAR LENS PLACEMENT (IOC) RIGHT  3.37  00:30.2;  Surgeon: Nevada Crane, MD;  Location: [facility];  Service: Ophthalmology;  Laterality: Right;   CHOLECYSTECTOMY  09/2022   COLONOSCOPY     ESOPHAGOGASTRODUODENOSCOPY (EGD) WITH PROPOFOL N/A 07/27/2020   Procedure: ESOPHAGOGASTRODUODENOSCOPY (EGD) WITH PROPOFOL;  Surgeon: Midge Minium, MD;  Location: [facility];  Service: Endoscopy;  Laterality: N/A;   JOINT REPLACEMENT Left 02/2016   partial knee   REPLACEMENT TOTAL KNEE Right 03/2023   TONSILLECTOMY     TUBAL LIGATION      Allergies  Allergen Reactions   Atorvastatin Other (See Comments)   Augmentin [Amoxicillin-Pot Clavulanate] Nausea Only    Abdominal Pain    Rosuvastatin Other (See  Comments)    Severe Neuropathy   Wound Dressing Adhesive Itching and Rash    "Beet red" at dressing site after knee replacement   Clavulanic Acid Other (See Comments)    Abdominal pain   Lisinopril Cough          Physical Exam: General: The patient is alert and oriented x3 in no acute distress.  Dermatology: Skin is warm, dry and supple bilateral lower extremities.   Vascular: Palpable pedal pulses bilaterally. Capillary refill within normal limits.  No appreciable edema.  No erythema.  Neurological: Grossly intact via light touch  Musculoskeletal Exam: No pedal deformities noted.  There is some tenderness with palpation just proximal to the second and third MTP of the bilateral forefoot  Assessment/Plan of Care: 1.  Capsulitis bilateral forefoot  -Patient evaluated.   -Injection of 0.5 cc Celestone Soluspan injected into the bilateral forefoot just proximal to the second and third MTP -Advised against going barefoot or wearing slippers only around the house.  Recommend good supportive shoes that provide arch support.  Patient states that she is essentially asymptomatic when she wears her sketchers tennis shoes -Continue OTC Motrin as needed -Patient also takes gabapentin for recent right knee surgery.  Continue as prescribed -Return to clinic as needed       Felecia Shelling, DPM [facility]  Dr. Felecia Shelling, DPM    [address]  [place], [place] [address]                Office [phone]  Fax [phone]

## 2023-09-25 ENCOUNTER — Other Ambulatory Visit: Payer: Self-pay

## 2023-09-25 ENCOUNTER — Encounter: Payer: Self-pay | Admitting: Gastroenterology

## 2023-09-29 NOTE — Anesthesia Preprocedure Evaluation (Addendum)
 Anesthesia Evaluation  Patient identified by MRN, date of birth, ID band Patient awake    History of Anesthesia Complications (+) PONV and history of anesthetic complications  Airway Mallampati: III  TM Distance: <3 FB Neck ROM: Full    Dental no notable dental hx.    Pulmonary former smoker   Pulmonary exam normal breath sounds clear to auscultation       Cardiovascular Normal cardiovascular exam Rhythm:Regular Rate:Normal     Neuro/Psych  PSYCHIATRIC DISORDERS Anxiety        GI/Hepatic   Endo/Other  Hypothyroidism    Renal/GU CKD stage 3a     Musculoskeletal   Abdominal   Peds  Hematology   Anesthesia Other Findings Hyperlipidemia  GERD (gastroesophageal reflux disease) Hypothyroid  Arthritis Anxiety  Motion sickness Coronary artery disease  Primary osteoarthritis of right knee    Reproductive/Obstetrics                             Anesthesia Physical Anesthesia Plan  ASA: 2  Anesthesia Plan: General   Post-op Pain Management: Minimal or no pain anticipated   Induction: Intravenous  PONV Risk Score and Plan: 3 and Propofol  infusion, TIVA and Ondansetron   Airway Management Planned: Nasal Cannula  Additional Equipment: None  Intra-op Plan:   Post-operative Plan:   Informed Consent: I have reviewed the patients History and Physical, chart, labs and discussed the procedure including the risks, benefits and alternatives for the proposed anesthesia with the patient or authorized representative who has indicated his/her understanding and acceptance.     Dental advisory given  Plan Discussed with: CRNA and Surgeon  Anesthesia Plan Comments: (Discussed risks of anesthesia with patient, including possibility of difficulty with spontaneous ventilation under anesthesia necessitating airway intervention, PONV, and rare risks such as cardiac or respiratory or neurological  events, and allergic reactions. Discussed the role of CRNA in patient's perioperative care. Patient understands.)       Anesthesia Quick Evaluation

## 2023-09-30 DIAGNOSIS — M5416 Radiculopathy, lumbar region: Secondary | ICD-10-CM | POA: Diagnosis not present

## 2023-09-30 DIAGNOSIS — M9903 Segmental and somatic dysfunction of lumbar region: Secondary | ICD-10-CM | POA: Diagnosis not present

## 2023-09-30 DIAGNOSIS — M9905 Segmental and somatic dysfunction of pelvic region: Secondary | ICD-10-CM | POA: Diagnosis not present

## 2023-09-30 DIAGNOSIS — M4306 Spondylolysis, lumbar region: Secondary | ICD-10-CM | POA: Diagnosis not present

## 2023-10-06 ENCOUNTER — Ambulatory Visit: Payer: Medicare Other | Admitting: Anesthesiology

## 2023-10-06 ENCOUNTER — Ambulatory Visit
Admission: RE | Admit: 2023-10-06 | Discharge: 2023-10-06 | Disposition: A | Discharge status: Home / Self Care | Payer: Medicare Other | Attending: Gastroenterology | Admitting: Gastroenterology

## 2023-10-06 ENCOUNTER — Other Ambulatory Visit: Payer: Self-pay

## 2023-10-06 ENCOUNTER — Encounter
Admission: RE | Disposition: A | Discharge status: Home / Self Care | Payer: Self-pay | Source: Home / Self Care | Attending: Gastroenterology

## 2023-10-06 ENCOUNTER — Encounter: Payer: Self-pay | Admitting: Gastroenterology

## 2023-10-06 DIAGNOSIS — K5730 Diverticulosis of large intestine without perforation or abscess without bleeding: Secondary | ICD-10-CM

## 2023-10-06 DIAGNOSIS — K219 Gastro-esophageal reflux disease without esophagitis: Secondary | ICD-10-CM | POA: Diagnosis not present

## 2023-10-06 DIAGNOSIS — I2510 Atherosclerotic heart disease of native coronary artery without angina pectoris: Secondary | ICD-10-CM | POA: Diagnosis not present

## 2023-10-06 DIAGNOSIS — Q438 Other specified congenital malformations of intestine: Secondary | ICD-10-CM | POA: Insufficient documentation

## 2023-10-06 DIAGNOSIS — Z87891 Personal history of nicotine dependence: Secondary | ICD-10-CM | POA: Insufficient documentation

## 2023-10-06 DIAGNOSIS — R194 Change in bowel habit: Secondary | ICD-10-CM

## 2023-10-06 DIAGNOSIS — N1831 Chronic kidney disease, stage 3a: Secondary | ICD-10-CM | POA: Insufficient documentation

## 2023-10-06 HISTORY — PX: COLONOSCOPY WITH PROPOFOL: SHX5780

## 2023-10-06 SURGERY — COLONOSCOPY WITH PROPOFOL
Anesthesia: General | Site: Rectum

## 2023-10-06 MED ORDER — LACTATED RINGERS IV SOLN: INTRAVENOUS | Status: DC

## 2023-10-06 MED ORDER — PROPOFOL 10 MG/ML IV BOLUS
INTRAVENOUS | Status: DC | PRN
  Administered 2023-10-06: 90 mg via INTRAVENOUS
  Administered 2023-10-06: 30 mg via INTRAVENOUS
  Administered 2023-10-06 (×2): 10 mg via INTRAVENOUS
  Administered 2023-10-06: 40 mg via INTRAVENOUS
  Administered 2023-10-06: 30 mg via INTRAVENOUS

## 2023-10-06 MED ORDER — STERILE WATER FOR IRRIGATION IR SOLN
Status: DC | PRN
  Administered 2023-10-06: 50 mL

## 2023-10-06 MED ORDER — SODIUM CHLORIDE 0.9 % IV SOLN: INTRAVENOUS | Status: DC

## 2023-10-06 MED ORDER — LIDOCAINE HCL (CARDIAC) PF 100 MG/5ML IV SOSY
PREFILLED_SYRINGE | INTRAVENOUS | Status: DC | PRN
  Administered 2023-10-06: 50 mg via INTRAVENOUS

## 2023-10-06 SURGICAL SUPPLY — 4 items
GOWN CVR UNV OPN BCK APRN NK (MISCELLANEOUS) ×2 IMPLANT
KIT PRC NS LF DISP ENDO (KITS) ×1 IMPLANT
MANIFOLD NEPTUNE II (INSTRUMENTS) ×1 IMPLANT
WATER STERILE IRR 250ML POUR (IV SOLUTION) ×1 IMPLANT

## 2023-10-06 NOTE — H&P (Signed)
 Rogelia Copping, MD [facility] [address] [place], [place] [address] Phone:[phone] Fax : [phone]  Primary Care Physician:  Justus Leita DEL, MD Primary Gastroenterologist:  Dr. Copping  Pre-Procedure History & Physical: HPI:  Martha Walsh is a 73 y.o. female is here for an colonoscopy.   Past Medical History:  Diagnosis Date   Anxiety    Arthritis    Coronary artery disease    GERD (gastroesophageal reflux disease)    Hyperlipidemia    Hypothyroid    Motion sickness    Boats   Primary osteoarthritis of right knee 12/01/2020   S/p TKA  2024      Past Surgical History:  Procedure Laterality Date   APPENDECTOMY     CATARACT EXTRACTION W/PHACO Left 11/18/2022   Procedure: CATARACT EXTRACTION PHACO AND INTRAOCULAR LENS PLACEMENT (IOC) LEFT  10.20  00:55.0;  Surgeon: Myrna Adine Anes, MD;  Location: [facility];  Service: Ophthalmology;  Laterality: Left;   CATARACT EXTRACTION W/PHACO Right 12/30/2022   Procedure: CATARACT EXTRACTION PHACO AND INTRAOCULAR LENS PLACEMENT (IOC) RIGHT  3.37  00:30.2;  Surgeon: Myrna Adine Anes, MD;  Location: [facility];  Service: Ophthalmology;  Laterality: Right;   CHOLECYSTECTOMY  09/2022   COLONOSCOPY     ESOPHAGOGASTRODUODENOSCOPY (EGD) WITH PROPOFOL  N/A 07/27/2020   Procedure: ESOPHAGOGASTRODUODENOSCOPY (EGD) WITH PROPOFOL ;  Surgeon: Copping Rogelia, MD;  Location: [facility];  Service: Endoscopy;  Laterality: N/A;   JOINT REPLACEMENT Left 02/2016   partial knee   REPLACEMENT TOTAL KNEE Right 03/2023   TONSILLECTOMY     TUBAL LIGATION      Prior to Admission medications   Medication Sig Start Date End Date Taking? Authorizing Provider  acetaminophen  (TYLENOL ) 500 MG tablet Take 2 tablets (1,000 mg total) by mouth every 6 (six) hours as needed for mild pain. 09/26/22  Yes Piscoya, Aloysius, MD  Calcium  Carbonate-Vitamin D  600-400 MG-UNIT tablet Take 1 tablet by mouth daily. 09/29/12  Yes [provider]  citalopram  (CELEXA ) 10 MG tablet Take 1 tablet (10 mg total) by mouth at bedtime. 06/06/23  Yes Justus Leita DEL, MD  ezetimibe  (ZETIA ) 10 MG tablet Take 1 tablet (10 mg total) by mouth at bedtime. 06/06/23  Yes Justus Leita DEL, MD  gabapentin  (NEURONTIN ) 300 MG capsule Take 1 capsule by mouth 3 (three) times daily.   Yes [provider]  ibuprofen  (ADVIL ) 800 MG tablet Take 1 tablet (800 mg total) by mouth every 8 (eight) hours as needed for moderate pain. 09/26/22  Yes Piscoya, Aloysius, MD  levothyroxine  (SYNTHROID ) 50 MCG tablet TAKE 1 TABLET BY MOUTH ONCE DAILY BEFORE BREAKFAST 08/11/23  Yes Justus Leita DEL, MD  Omega-3 Fatty Acids (FISH OIL PO) Take by mouth.   Yes [provider]  pantoprazole  (PROTONIX ) 40 MG tablet Take 1 tablet (40 mg total) by mouth daily. 08/26/23  Yes Copping Rogelia, MD  potassium chloride (KLOR-CON) 10 MEQ tablet Take 10 mEq by mouth at bedtime. Every other day for leg cramps OTC   Yes [provider]  pravastatin  (PRAVACHOL ) 80 MG tablet Take 1 tablet by mouth once daily 07/08/23  Yes Berglund, Laura H, MD  Cholecalciferol 25 MCG (1000 UT) tablet Take 1,000 Units by mouth daily.    [provider]  clonazePAM (KLONOPIN) 0.5 MG tablet Take 0.5 mg by mouth as needed for anxiety. 03/03/19   [provider]  cyclobenzaprine (FLEXERIL) 5 MG tablet Take 5 mg by mouth as needed. 11/03/20  [provider]  nitroGLYCERIN  (NITROSTAT ) 0.4 MG SL tablet Place 1 tablet (0.4 mg total) under the tongue every 5 (five) minutes as needed for chest pain. 11/28/22   Justus Leita DEL, MD    Allergies as of 08/27/2023 - Review Complete 08/26/2023  Allergen Reaction Noted   Atorvastatin  Other (See Comments) 06/06/2023   Augmentin [amoxicillin-pot clavulanate] Nausea Only 11/22/2013   Rosuvastatin Other (See Comments) 12/09/2022   Wound dressing adhesive Itching and Rash 09/18/2022   Clavulanic acid Other (See Comments)  01/14/2022   Lisinopril Cough 09/10/2016    Family History  Problem Relation Age of Onset   Heart disease Father     Social History   Socioeconomic History   Marital status: Married    Spouse name: Not on file   Number of children: 3   Years of education: Not on file   Highest education level: Associate degree: academic program  Occupational History   Occupation: retired  Tobacco Use   Smoking status: Former    Current packs/day: 0.00    Types: Cigarettes    Quit date: 09/23/1998    Years since quitting: 25.0    Passive exposure: Past   Smokeless tobacco: Never  Vaping Use   Vaping status: Never Used  Substance and Sexual Activity   Alcohol use: Yes    Alcohol/week: 7.0 standard drinks of alcohol    Types: 7 Glasses of wine per week    Comment: occasionally   Drug use: Never   Sexual activity: Not Currently  Other Topics Concern   Not on file  Social History Narrative   Not on file   Social Drivers of Health   Financial Resource Strain: Low Risk  (03/06/2022)   Overall Financial Resource Strain (CARDIA)    Difficulty of Paying Living Expenses: Not hard at all  Food Insecurity: No Food Insecurity (11/27/2022)   Hunger Vital Sign    Worried About Running Out of Food in the Last Year: Never true    Ran Out of Food in the Last Year: Never true  Transportation Needs: No Transportation Needs (11/27/2022)   PRAPARE - Administrator, Civil Service (Medical): No    Lack of Transportation (Non-Medical): No  Physical Activity: Inactive (03/06/2022)   Exercise Vital Sign    Days of Exercise per Week: 0 days    Minutes of Exercise per Session: 0 min  Stress: No Stress Concern Present (03/06/2022)   [employer] of Occupational Health - Occupational Stress Questionnaire    Feeling of Stress : Not at all  Social Connections: Socially Integrated (03/06/2022)   Social Connection and Isolation Panel [NHANES]    Frequency of Communication with Friends and Family:  More than three times a week    Frequency of Social Gatherings with Friends and Family: More than three times a week    Attends Religious Services: More than 4 times per year    Active Member of [employer] or Organizations: Yes    Attends Engineer, Structural: More than 4 times per year    Marital Status: Married  Catering Manager Violence: Not At Risk (03/06/2022)   Humiliation, Afraid, Rape, and Kick questionnaire    Fear of Current or Ex-Partner: No    Emotionally Abused: No    Physically Abused: No    Sexually Abused: No    Review of Systems: See HPI, otherwise negative ROS  Physical Exam: BP 125/74   Pulse 71   Temp (!) 97.2 F (36.2 C) (  Temporal)   Resp 10   Ht 5' 4 (1.626 m)   Wt 88 kg   SpO2 94%   BMI 33.30 kg/m  General:   Alert,  pleasant and cooperative in NAD Head:  Normocephalic and atraumatic. Neck:  Supple; no masses or thyromegaly. Lungs:  Clear throughout to auscultation.    Heart:  Regular rate and rhythm. Abdomen:  Soft, nontender and nondistended. Normal bowel sounds, without guarding, and without rebound.   Neurologic:  Alert and  oriented x4;  grossly normal neurologically.  Impression/Plan: Taryne S Battie is here for an colonoscopy to be performed for change in bowel habits  Risks, benefits, limitations, and alternatives regarding  colonoscopy have been reviewed with the patient.  Questions have been answered.  All parties agreeable.   Rogelia Copping, MD  10/06/2023, 10:26 AM

## 2023-10-06 NOTE — Transfer of Care (Signed)
 Immediate Anesthesia Transfer of Care Note  Patient: Martha Walsh  Procedure(s) Performed: COLONOSCOPY WITH PROPOFOL   Patient Location: PACU  Anesthesia Type: General  Level of Consciousness: awake, alert  and patient cooperative  Airway and Oxygen Therapy: Patient Spontanous Breathing and Patient connected to supplemental oxygen  Post-op Assessment: Post-op Vital signs reviewed, Patient's Cardiovascular Status Stable, Respiratory Function Stable, Patent Airway and No signs of Nausea or vomiting  Post-op Vital Signs: Reviewed and stable  Complications: No notable events documented.

## 2023-10-06 NOTE — Anesthesia Postprocedure Evaluation (Signed)
 Anesthesia Post Note  Patient: Martha Walsh  Procedure(s) Performed: COLONOSCOPY WITH PROPOFOL  (Rectum)  Patient location during evaluation: PACU Anesthesia Type: General Level of consciousness: awake and alert Pain management: pain level controlled Vital Signs Assessment: post-procedure vital signs reviewed and stable Respiratory status: spontaneous breathing, nonlabored ventilation, respiratory function stable and patient connected to nasal cannula oxygen Cardiovascular status: blood pressure returned to baseline and stable Postop Assessment: no apparent nausea or vomiting Anesthetic complications: no  No notable events documented.   Last Vitals:  Vitals:   10/06/23 1056 10/06/23 1103  BP: (!) 94/49 106/64  Pulse: 64 71  Resp: 14 16  Temp: 36.5 C (!) 36.4 C  SpO2: 94% 95%    Last Pain:  Vitals:   10/06/23 1103  TempSrc:   PainSc: 0-No pain                 Debby Mines

## 2023-10-06 NOTE — Op Note (Signed)
 [facility] Gastroenterology Patient Name: Martha Walsh Procedure Date: 10/06/2023 10:28 AM MRN: [id number] Account #: [id number] Date of Birth: [birth year] Admit Type: Outpatient Age: 73 Room: [facility] OR ROOM 01 Gender: Female Note Status: Finalized Instrument Name: [id number] Procedure:             Colonoscopy Indications:           Incidental change in bowel habits noted Providers:             Rogelia Copping MD, MD Referring MD:          Rogelia Copping MD, MD (Referring MD), Leita Adie, MD                         (Referring MD) Medicines:             Propofol  per Anesthesia Complications:         No immediate complications. Procedure:             Pre-Anesthesia Assessment:                        - Prior to the procedure, a History and Physical was                         performed, and patient medications and allergies were                         reviewed. The patient's tolerance of previous                         anesthesia was also reviewed. The risks and benefits                         of the procedure and the sedation options and risks                         were discussed with the patient. All questions were                         answered, and informed consent was obtained. Prior                         Anticoagulants: The patient has taken no anticoagulant                         or antiplatelet agents. ASA Grade Assessment: II - A                         patient with mild systemic disease. After reviewing                         the risks and benefits, the patient was deemed in                         satisfactory condition to undergo the procedure.                        After obtaining informed consent, the colonoscope was  passed under direct vision. Throughout the procedure,                         the patient's blood pressure, pulse, and oxygen                         saturations were monitored continuously. The                          Colonoscope was introduced through the anus and                         advanced to the the cecum, identified by appendiceal                         orifice and ileocecal valve. The colonoscopy was                         performed without difficulty. The patient tolerated                         the procedure well. The quality of the bowel                         preparation was excellent. Findings:      The perianal and digital rectal examinations were normal.      The sigmoid colon was moderately tortuous.      The exam was otherwise without abnormality.      A few small-mouthed diverticula were found in the sigmoid colon. Impression:            - Tortuous colon.                        - The examination was otherwise normal.                        - Diverticulosis in the sigmoid colon.                        - No specimens collected. Recommendation:        - Discharge patient to home.                        - Resume previous diet.                        - Continue present medications.                        - Repeat colonoscopy is not recommended for                         surveillance. Procedure Code(s):     --- Professional ---                        [phone], Colonoscopy, flexible; diagnostic, including                         collection of specimen(s) by brushing or washing, when  performed (separate procedure) Diagnosis Code(s):     --- Professional ---                        Q43.8, Other specified congenital malformations of                         intestine CPT copyright 2022 American Medical Association. All rights reserved. The codes documented in this report are preliminary and upon coder review may  be revised to meet current compliance requirements. Rogelia Copping MD, MD 10/06/2023 10:56:22 AM This report has been signed electronically. Number of Addenda: 0 Note Initiated On: 10/06/2023 10:28 AM Scope Withdrawal Time: 0 hours 6 minutes 33  seconds  Total Procedure Duration: 0 hours 19 minutes 44 seconds  Estimated Blood Loss:  Estimated blood loss: none.      [facility]

## 2023-10-07 ENCOUNTER — Encounter: Payer: Self-pay | Admitting: Gastroenterology

## 2023-10-10 ENCOUNTER — Ambulatory Visit: Admit: 2023-10-10 | Payer: Medicare Other | Admitting: Ophthalmology

## 2023-10-10 SURGERY — BLEPHAROPLASTY
Anesthesia: Monitor Anesthesia Care | Laterality: Bilateral

## 2023-10-14 ENCOUNTER — Telehealth: Payer: Self-pay | Admitting: Internal Medicine

## 2023-10-14 NOTE — Telephone Encounter (Signed)
Copied from CRM [id number]. Topic: Medicare AWV >> Oct 14, 2023  1:59 PM Payton Doughty wrote: Reason for CRM: Called LVM 10/14/2023 to schedule AWV. Please schedule Virtual or Telehealth visits ONLY.   Verlee Rossetti; Care Guide Ambulatory Clinical Support [place] [facility] Direct Dial: [phone]

## 2023-10-29 DIAGNOSIS — M9903 Segmental and somatic dysfunction of lumbar region: Secondary | ICD-10-CM | POA: Diagnosis not present

## 2023-10-29 DIAGNOSIS — M5416 Radiculopathy, lumbar region: Secondary | ICD-10-CM | POA: Diagnosis not present

## 2023-10-29 DIAGNOSIS — M9905 Segmental and somatic dysfunction of pelvic region: Secondary | ICD-10-CM | POA: Diagnosis not present

## 2023-10-29 DIAGNOSIS — M4306 Spondylolysis, lumbar region: Secondary | ICD-10-CM | POA: Diagnosis not present

## 2023-11-05 ENCOUNTER — Other Ambulatory Visit: Payer: Self-pay | Admitting: Internal Medicine

## 2023-11-06 ENCOUNTER — Ambulatory Visit: Payer: Self-pay | Admitting: *Deleted

## 2023-11-06 ENCOUNTER — Encounter: Payer: Self-pay | Admitting: Physician Assistant

## 2023-11-06 ENCOUNTER — Ambulatory Visit: Payer: Medicare Other | Admitting: Physician Assistant

## 2023-11-06 VITALS — BP 118/62 | HR 75 | Temp 98.2°F | Ht 64.0 in | Wt 194.0 lb

## 2023-11-06 DIAGNOSIS — J069 Acute upper respiratory infection, unspecified: Secondary | ICD-10-CM

## 2023-11-06 DIAGNOSIS — R011 Cardiac murmur, unspecified: Secondary | ICD-10-CM

## 2023-11-06 MED ORDER — BENZONATATE 200 MG PO CAPS: 200.0000 mg | ORAL_CAPSULE | Freq: Two times a day (BID) | ORAL | 0 refills | Status: DC | PRN

## 2023-11-06 MED ORDER — AZITHROMYCIN 250 MG PO TABS: ORAL_TABLET | ORAL | 0 refills | Status: AC

## 2023-11-06 MED ORDER — ALBUTEROL SULFATE HFA 108 (90 BASE) MCG/ACT IN AERS: 2.0000 | INHALATION_SPRAY | Freq: Four times a day (QID) | RESPIRATORY_TRACT | 2 refills | Status: DC | PRN

## 2023-11-06 NOTE — Telephone Encounter (Signed)
Reason for Disposition  [1] Continuous (nonstop) coughing interferes with work or school AND [2] no improvement using cough treatment per Care Advice  Answer Assessment - Initial Assessment Questions 1. ONSET: "When did the cough begin?"      1 week 2. SEVERITY: "How bad is the cough today?"      Coughing more- loose- feeling achy 3. SPUTUM: "Describe the color of your sputum" (none, dry cough; clear, white, yellow, green)     yellow 4. HEMOPTYSIS: "Are you coughing up any blood?" If so ask: "How much?" (flecks, streaks, tablespoons, etc.)     no 5. DIFFICULTY BREATHING: "Are you having difficulty breathing?" If Yes, ask: "How bad is it?" (e.g., mild, moderate, severe)    - MILD: No SOB at rest, mild SOB with walking, speaks normally in sentences, can lie down, no retractions, pulse < 100.    - MODERATE: SOB at rest, SOB with minimal exertion and prefers to sit, cannot lie down flat, speaks in phrases, mild retractions, audible wheezing, pulse 100-120.    - SEVERE: Very SOB at rest, speaks in single words, struggling to breathe, sitting hunched forward, retractions, pulse > 120      Wheezing-"squeaking" normal breathing 6. FEVER: "Do you have a fever?" If Yes, ask: "What is your temperature, how was it measured, and when did it start?"     chills  10. OTHER SYMPTOMS: "Do you have any other symptoms?" (e.g., runny nose, wheezing, chest pain)       Sore throat- scratchy  Protocols used: Cough - Acute Productive-A-AH

## 2023-11-06 NOTE — Telephone Encounter (Signed)
  Chief Complaint: cough, chest congestion Symptoms: Patient states her symptoms were improving- but now sem to be getting worse- cough with chest congestion-' squeaking' at night Frequency: 1 week Pertinent Negatives: Patient denies SOB Disposition: [] ED /[] Urgent Care (no appt availability in office) / [x] Appointment(In office/virtual)/ []  [facility]/ [] Home Care/ [] Refused Recommended Disposition /[] [place] [facility]/ []  Follow-up with PCP Additional Notes: Patient has air travel in am- daughter's wedding- patient has chest congestion with cough, feverish symptoms. Patient scheduled at office

## 2023-11-06 NOTE — Progress Notes (Signed)
 Date:  11/06/2023   Name:  Martha Walsh   DOB:  [birth year]   MRN:  [id number]   Chief Complaint: Cough  Cough This is a new problem. Episode onset: X1 week. The problem has been gradually worsening. The cough is Productive of sputum (yellow). Associated symptoms include nasal congestion and wheezing. Nothing aggravates the symptoms. She has tried steroid inhaler for the symptoms. The treatment provided mild relief.   Martha Walsh is a pleasant 73 year old female who typically sees my colleague Dr. Bari Edward, here for evaluation of acute URI symptoms for the past 7 days.  Two family members also sick; grandson started with a cold just over a week ago, then the patient and her husband came down with symptoms.  Patient thought she was recovering, but worsened in the last 24 hours.  She is using Delsym cough syrup at home with reasonable effect.  Has an old albuterol inhaler, not sure if it is expired or still works.  She is also flying out early tomorrow morning to attend her granddaughter's wedding.   Medication list has been reviewed and updated.  Current Meds  Medication Sig   acetaminophen (TYLENOL) 500 MG tablet Take 2 tablets (1,000 mg total) by mouth every 6 (six) hours as needed for mild pain.   albuterol (VENTOLIN HFA) 108 (90 Base) MCG/ACT inhaler Inhale 2 puffs into the lungs every 6 (six) hours as needed for wheezing or shortness of breath.   azithromycin (ZITHROMAX) 250 MG tablet Take 2 tablets on day 1, then 1 tablet daily on days 2 through 5   benzonatate (TESSALON) 200 MG capsule Take 1 capsule (200 mg total) by mouth 2 (two) times daily as needed for cough.   Calcium Carbonate-Vitamin D 600-400 MG-UNIT tablet Take 1 tablet by mouth daily.   Cholecalciferol 25 MCG (1000 UT) tablet Take 1,000 Units by mouth daily.   citalopram (CELEXA) 10 MG tablet Take 1 tablet (10 mg total) by mouth at bedtime.   clonazePAM (KLONOPIN) 0.5 MG tablet Take 0.5 mg by mouth as needed for anxiety.    cyclobenzaprine (FLEXERIL) 5 MG tablet Take 5 mg by mouth as needed.   ezetimibe (ZETIA) 10 MG tablet Take 1 tablet (10 mg total) by mouth at bedtime.   gabapentin (NEURONTIN) 300 MG capsule Take 1 capsule by mouth 3 (three) times daily.   ibuprofen (ADVIL) 800 MG tablet Take 1 tablet (800 mg total) by mouth every 8 (eight) hours as needed for moderate pain.   levothyroxine (SYNTHROID) 50 MCG tablet TAKE 1 TABLET BY MOUTH ONCE DAILY BEFORE BREAKFAST   nitroGLYCERIN (NITROSTAT) 0.4 MG SL tablet Place 1 tablet (0.4 mg total) under the tongue every 5 (five) minutes as needed for chest pain.   Omega-3 Fatty Acids (FISH OIL PO) Take by mouth.   pantoprazole (PROTONIX) 40 MG tablet Take 1 tablet (40 mg total) by mouth daily.   potassium chloride (KLOR-CON) 10 MEQ tablet Take 10 mEq by mouth at bedtime. Every other day for leg cramps OTC   pravastatin (PRAVACHOL) 80 MG tablet Take 1 tablet by mouth once daily     Review of Systems  Respiratory:  Positive for cough and wheezing.     Patient Active Problem List   Diagnosis Date Noted   Change in bowel habits 10/06/2023   Osteopenia determined by x-ray 06/06/2023   Chronic kidney disease, stage 3a (HCC) 11/04/2022   Low back pain 10/05/2021   History of nonmelanoma skin cancer 12/04/2020  Generalized anxiety disorder 06/04/2019   Status post left partial knee replacement 03/12/2016   Acquired hypothyroidism 02/07/2016   Atypical mole 11/22/2015   Gastro-esophageal reflux disease without esophagitis 08/04/2015   Nocturnal leg cramps 11/22/2013   Obesity (BMI 30-39.9) 11/22/2013   Mixed hyperlipidemia 09/28/2013    Allergies  Allergen Reactions   Atorvastatin Other (See Comments)   Augmentin [Amoxicillin-Pot Clavulanate] Nausea Only    Abdominal Pain    Rosuvastatin Other (See Comments)    Severe Neuropathy   Wound Dressing Adhesive Itching and Rash    "Beet red" at dressing site after knee replacement   Clavulanic Acid Other (See  Comments)    Abdominal pain   Lisinopril Cough         Immunization History  Administered Date(s) Administered   Influenza, High Dose Seasonal PF 06/03/2019   Influenza, Seasonal, Injecte, Preservative Fre 06/06/2010   Influenza,inj,Quad PF,6+ Mos 09/10/2016, 10/14/2017, 06/15/2018   Influenza-Unspecified 06/03/2019, 06/07/2020, 06/17/2022, 07/03/2023   PFIZER(Purple Top)SARS-COV-2 Vaccination 11/05/2019, 11/26/2019, 09/18/2020   Pneumococcal Conjugate-13 11/22/2013   Pneumococcal Polysaccharide-23 01/16/2015   Tdap 06/06/2010, 07/29/2020   Zoster Recombinant(Shingrix) 10/18/2021, 04/08/2022   Zoster, Live 02/07/2016    Past Surgical History:  Procedure Laterality Date   APPENDECTOMY     CATARACT EXTRACTION W/PHACO Left 11/18/2022   Procedure: CATARACT EXTRACTION PHACO AND INTRAOCULAR LENS PLACEMENT (IOC) LEFT  10.20  00:55.0;  Surgeon: Nevada Crane, MD;  Location: [facility];  Service: Ophthalmology;  Laterality: Left;   CATARACT EXTRACTION W/PHACO Right 12/30/2022   Procedure: CATARACT EXTRACTION PHACO AND INTRAOCULAR LENS PLACEMENT (IOC) RIGHT  3.37  00:30.2;  Surgeon: Nevada Crane, MD;  Location: [facility];  Service: Ophthalmology;  Laterality: Right;   CHOLECYSTECTOMY  09/2022   COLONOSCOPY     COLONOSCOPY WITH PROPOFOL N/A 10/06/2023   Procedure: COLONOSCOPY WITH PROPOFOL;  Surgeon: Midge Minium, MD;  Location: [facility];  Service: Endoscopy;  Laterality: N/A;   ESOPHAGOGASTRODUODENOSCOPY (EGD) WITH PROPOFOL N/A 07/27/2020   Procedure: ESOPHAGOGASTRODUODENOSCOPY (EGD) WITH PROPOFOL;  Surgeon: Midge Minium, MD;  Location: [facility];  Service: Endoscopy;  Laterality: N/A;   JOINT REPLACEMENT Left 02/2016   partial knee   REPLACEMENT TOTAL KNEE Right 03/2023   TONSILLECTOMY     TUBAL LIGATION      Social History   Tobacco Use   Smoking status: Former    Current packs/day: 0.00    Types: Cigarettes    Quit date:  09/23/1998    Years since quitting: 25.1    Passive exposure: Past   Smokeless tobacco: Never  Vaping Use   Vaping status: Never Used  Substance Use Topics   Alcohol use: Yes    Alcohol/week: 7.0 standard drinks of alcohol    Types: 7 Glasses of wine per week    Comment: occasionally   Drug use: Never    Family History  Problem Relation Age of Onset   Heart disease Father         11/06/2023    4:08 PM 06/06/2023    9:27 AM 11/04/2022    9:26 AM 09/10/2022   10:33 AM  GAD 7 : Generalized Anxiety Score  Nervous, Anxious, on Edge 0 0 0 0  Control/stop worrying 0 0 0 0  Worry too much - different things 0 0 0 0  Trouble relaxing 0 0 0 0  Restless 0 0 0 0  Easily annoyed or irritable 0 0 0 0  Afraid - awful might happen 0  0 0 0  Total GAD 7 Score 0 0 0 0  Anxiety Difficulty Not difficult at all Not difficult at all Not difficult at all Not difficult at all       11/06/2023    4:08 PM 06/06/2023    9:26 AM 11/04/2022    9:26 AM  Depression screen PHQ 2/9  Decreased Interest 0 0 0  Down, Depressed, Hopeless 0 0 0  PHQ - 2 Score 0 0 0  Altered sleeping  0 0  Tired, decreased energy  0 0  Change in appetite  0 0  Feeling bad or failure about yourself   0 0  Trouble concentrating  0 0  Moving slowly or fidgety/restless  0 0  Suicidal thoughts  0 0  PHQ-9 Score  0 0  Difficult doing work/chores  Not difficult at all Not difficult at all    BP Readings from Last 3 Encounters:  11/06/23 118/62  10/06/23 106/64  08/26/23 122/75    Wt Readings from Last 3 Encounters:  11/06/23 194 lb (88 kg)  10/06/23 194 lb (88 kg)  09/05/23 196 lb (88.9 kg)    BP 118/62   Pulse 75   Temp 98.2 F (36.8 C)   Ht 5\' 4"  (1.626 m)   Wt 194 lb (88 kg)   SpO2 95%   BMI 33.30 kg/m   Physical Exam Vitals and nursing note reviewed.  Constitutional:      General: She is not in acute distress.    Appearance: Normal appearance.  HENT:     Right Ear: Tympanic membrane normal.      Left Ear: Tympanic membrane normal.     Ears:     Comments: EAC clear bilaterally with good view of TM which is without effusion or erythema.     Nose: Congestion present.     Comments: Sinuses nontender    Mouth/Throat:     Mouth: Mucous membranes are moist.     Pharynx: No oropharyngeal exudate or posterior oropharyngeal erythema.  Eyes:     Conjunctiva/sclera: Conjunctivae normal.     Pupils: Pupils are equal, round, and reactive to light.  Cardiovascular:     Rate and Rhythm: Normal rate and regular rhythm.     Heart sounds: No murmur heard.    No friction rub. No gallop.  Pulmonary:     Effort: Pulmonary effort is normal.     Breath sounds: Normal breath sounds. No wheezing, rhonchi or rales.     Comments: Cough present Lymphadenopathy:     Cervical: No cervical adenopathy.     Recent Labs     Component Value Date/Time   NA 142 06/06/2023 1024   K 4.7 06/06/2023 1024   CL 107 (H) 06/06/2023 1024   CO2 22 06/06/2023 1024   GLUCOSE 93 06/06/2023 1024   GLUCOSE 105 (H) 11/25/2022 1053   BUN 24 06/06/2023 1024   CREATININE 1.06 (H) 06/06/2023 1024   CALCIUM 9.4 06/06/2023 1024   PROT 6.2 06/06/2023 1024   ALBUMIN 4.2 06/06/2023 1024   AST 18 06/06/2023 1024   ALT 17 06/06/2023 1024   ALKPHOS 80 06/06/2023 1024   BILITOT <0.2 06/06/2023 1024   GFRNONAA >60 11/25/2022 1053    Lab Results  Component Value Date   WBC 4.7 06/06/2023   HGB 10.5 (L) 06/06/2023   HCT 31.9 (L) 06/06/2023   MCV 86 06/06/2023   PLT 237 06/06/2023   No results found for: "HGBA1C" Lab Results  Component Value Date   CHOL 211 (H) 06/06/2023   HDL 58 06/06/2023   LDLCALC 129 (H) 06/06/2023   TRIG 136 06/06/2023   CHOLHDL 3.6 06/06/2023   Lab Results  Component Value Date   TSH 3.110 06/06/2023     Assessment and Plan:  1. Acute URI (Primary) Physical exam is actually quite reassuring today.  She sounds nasally congested and has a cough, but no concerning features for bacterial  disease.  That said, because of the duration of symptoms and the double sickening, will cover with azithromycin.  Also sending albuterol and benzonatate for additional symptomatic relief.  Likely viral etiology. Discussed self-limited nature of viral illnesses and advised conservative measures including rest, fluids, honey, and OTC cough/cold medications.   Contact precautions advised to limit spread. Encouraged mask wearing and good hand hygiene especially before meals. Call if acutely worsening symptoms or if no improvement after Day 7 of illness  - azithromycin (ZITHROMAX) 250 MG tablet; Take 2 tablets on day 1, then 1 tablet daily on days 2 through 5  Dispense: 6 tablet; Refill: 0 - albuterol (VENTOLIN HFA) 108 (90 Base) MCG/ACT inhaler; Inhale 2 puffs into the lungs every 6 (six) hours as needed for wheezing or shortness of breath.  Dispense: 8 g; Refill: 2 - benzonatate (TESSALON) 200 MG capsule; Take 1 capsule (200 mg total) by mouth 2 (two) times daily as needed for cough.  Dispense: 30 capsule; Refill: 0   F/u PRN   Alvester Morin, PA-C, DMSc, Nutritionist [facility] [phone]

## 2023-11-06 NOTE — Telephone Encounter (Signed)
Requested Prescriptions  Pending Prescriptions Disp Refills   levothyroxine (SYNTHROID) 50 MCG tablet [Pharmacy Med Name: Levothyroxine Sodium 50 MCG Oral Tablet] 90 tablet 0    Sig: TAKE 1 TABLET BY MOUTH ONCE DAILY BEFORE BREAKFAST     Endocrinology:  Hypothyroid Agents Passed - 11/06/2023 11:42 AM      Passed - TSH in normal range and within 360 days    TSH  Date Value Ref Range Status  06/06/2023 3.110 0.450 - 4.500 uIU/mL Final         Passed - Valid encounter within last 12 months    Recent Outpatient Visits           5 months ago Annual physical exam   [place] [facility] at [facility], Nyoka Cowden, MD   1 year ago Acquired hypothyroidism   [place] [facility] at [facility], Nyoka Cowden, MD   1 year ago Preop general physical exam   [facility] at [facility], Nyoka Cowden, MD   1 year ago Calculus of gallbladder without cholecystitis without obstruction   [facility] at [facility], Nyoka Cowden, MD   1 year ago Annual physical exam   [facility] at [facility], Nyoka Cowden, MD       Future Appointments             In 4 weeks Judithann Graves Nyoka Cowden, MD [facility] Primary [facility] Medicine at [facility]

## 2023-11-25 DIAGNOSIS — M9903 Segmental and somatic dysfunction of lumbar region: Secondary | ICD-10-CM | POA: Diagnosis not present

## 2023-11-25 DIAGNOSIS — M4306 Spondylolysis, lumbar region: Secondary | ICD-10-CM | POA: Diagnosis not present

## 2023-11-25 DIAGNOSIS — M9905 Segmental and somatic dysfunction of pelvic region: Secondary | ICD-10-CM | POA: Diagnosis not present

## 2023-11-25 DIAGNOSIS — M5416 Radiculopathy, lumbar region: Secondary | ICD-10-CM | POA: Diagnosis not present

## 2023-12-04 ENCOUNTER — Ambulatory Visit (INDEPENDENT_AMBULATORY_CARE_PROVIDER_SITE_OTHER): Payer: Medicare Other | Admitting: Internal Medicine

## 2023-12-04 ENCOUNTER — Encounter: Payer: Self-pay | Admitting: Internal Medicine

## 2023-12-04 VITALS — BP 112/70 | HR 66 | Ht 64.0 in | Wt 196.4 lb

## 2023-12-04 DIAGNOSIS — Z1231 Encounter for screening mammogram for malignant neoplasm of breast: Secondary | ICD-10-CM | POA: Diagnosis not present

## 2023-12-04 DIAGNOSIS — F411 Generalized anxiety disorder: Secondary | ICD-10-CM

## 2023-12-04 DIAGNOSIS — N1831 Chronic kidney disease, stage 3a: Secondary | ICD-10-CM | POA: Diagnosis not present

## 2023-12-04 DIAGNOSIS — D631 Anemia in chronic kidney disease: Secondary | ICD-10-CM | POA: Diagnosis not present

## 2023-12-04 NOTE — Assessment & Plan Note (Signed)
 Symptoms well controlled on Celexa and PRN clonazepam

## 2023-12-04 NOTE — Assessment & Plan Note (Addendum)
 Monitoring regularly.  Last Hbg 10.5 with normal iron studies She has started a B12 orally Reminded to stay hydrated and avoid NSAIDS

## 2023-12-04 NOTE — Progress Notes (Signed)
 Date:  12/04/2023   Name:  Martha Walsh   DOB:  1951-02-27   MRN:  [id number]   Chief Complaint: Anxiety  Anxiety Presents for follow-up visit. Patient reports no chest pain, dizziness, excessive worry, nervous/anxious behavior, obsessions, palpitations or shortness of breath.    CKD - she is no longer taking much Advil and has stopped gabapentin.  She is trying to stay hydrated as well.   CKD has been stable for the past several years.  Review of Systems  Constitutional:  Negative for fatigue and unexpected weight change.  HENT:  Negative for trouble swallowing.   Eyes:  Negative for visual disturbance.  Respiratory:  Negative for cough, chest tightness, shortness of breath and wheezing.   Cardiovascular:  Negative for chest pain, palpitations and leg swelling.  Gastrointestinal:  Negative for abdominal pain, constipation and diarrhea.  Musculoskeletal:  Negative for arthralgias and myalgias.  Neurological:  Negative for dizziness, weakness, light-headedness and headaches.  Psychiatric/Behavioral:  The patient is not nervous/anxious.      Lab Results  Component Value Date   NA 142 06/06/2023   K 4.7 06/06/2023   CO2 22 06/06/2023   GLUCOSE 93 06/06/2023   BUN 24 06/06/2023   CREATININE 1.06 (H) 06/06/2023   CALCIUM 9.4 06/06/2023   EGFR 55 (L) 06/06/2023   GFRNONAA >60 11/25/2022   Lab Results  Component Value Date   CHOL 211 (H) 06/06/2023   HDL 58 06/06/2023   LDLCALC 129 (H) 06/06/2023   TRIG 136 06/06/2023   CHOLHDL 3.6 06/06/2023   Lab Results  Component Value Date   TSH 3.110 06/06/2023   No results found for: "HGBA1C" Lab Results  Component Value Date   WBC 4.7 06/06/2023   HGB 10.5 (L) 06/06/2023   HCT 31.9 (L) 06/06/2023   MCV 86 06/06/2023   PLT 237 06/06/2023   Lab Results  Component Value Date   ALT 17 06/06/2023   AST 18 06/06/2023   ALKPHOS 80 06/06/2023   BILITOT <0.2 06/06/2023   Lab Results  Component Value Date   VD25OH 47.5  06/06/2023     Patient Active Problem List   Diagnosis Date Noted   Change in bowel habits 10/06/2023   Osteopenia determined by x-ray 06/06/2023   Anemia due to stage 3a chronic kidney disease (HCC) 11/04/2022   Low back pain 10/05/2021   History of nonmelanoma skin cancer 12/04/2020   Generalized anxiety disorder 06/04/2019   Status post left partial knee replacement 03/12/2016   Acquired hypothyroidism 02/07/2016   Atypical mole 11/22/2015   Gastro-esophageal reflux disease without esophagitis 08/04/2015   Nocturnal leg cramps 11/22/2013   Obesity (BMI 30-39.9) 11/22/2013   Mixed hyperlipidemia 09/28/2013    Allergies  Allergen Reactions   Atorvastatin Other (See Comments)   Augmentin [Amoxicillin-Pot Clavulanate] Nausea Only    Abdominal Pain    Rosuvastatin Other (See Comments)    Severe Neuropathy   Wound Dressing Adhesive Itching and Rash    "Beet red" at dressing site after knee replacement   Clavulanic Acid Other (See Comments)    Abdominal pain   Lisinopril Cough         Past Surgical History:  Procedure Laterality Date   APPENDECTOMY     CATARACT EXTRACTION W/PHACO Left 11/18/2022   Procedure: CATARACT EXTRACTION PHACO AND INTRAOCULAR LENS PLACEMENT (IOC) LEFT  10.20  00:55.0;  Surgeon: Nevada Crane, MD;  Location: [facility];  Service: Ophthalmology;  Laterality: Left;  CATARACT EXTRACTION W/PHACO Right 12/30/2022   Procedure: CATARACT EXTRACTION PHACO AND INTRAOCULAR LENS PLACEMENT (IOC) RIGHT  3.37  00:30.2;  Surgeon: Nevada Crane, MD;  Location: [facility];  Service: Ophthalmology;  Laterality: Right;   CHOLECYSTECTOMY  09/2022   COLONOSCOPY     COLONOSCOPY WITH PROPOFOL N/A 10/06/2023   Procedure: COLONOSCOPY WITH PROPOFOL;  Surgeon: Midge Minium, MD;  Location: [facility];  Service: Endoscopy;  Laterality: N/A;   ESOPHAGOGASTRODUODENOSCOPY (EGD) WITH PROPOFOL N/A 07/27/2020   Procedure: ESOPHAGOGASTRODUODENOSCOPY  (EGD) WITH PROPOFOL;  Surgeon: Midge Minium, MD;  Location: [facility];  Service: Endoscopy;  Laterality: N/A;   JOINT REPLACEMENT Left 02/2016   partial knee   REPLACEMENT TOTAL KNEE Right 03/2023   TONSILLECTOMY     TUBAL LIGATION      Social History   Tobacco Use   Smoking status: Former    Current packs/day: 0.00    Types: Cigarettes    Quit date: 09/23/1998    Years since quitting: 25.2    Passive exposure: Past   Smokeless tobacco: Never  Vaping Use   Vaping status: Never Used  Substance Use Topics   Alcohol use: Yes    Alcohol/week: 7.0 standard drinks of alcohol    Types: 7 Glasses of wine per week    Comment: occasionally   Drug use: Never     Medication list has been reviewed and updated.  Current Meds  Medication Sig   acetaminophen (TYLENOL) 500 MG tablet Take 2 tablets (1,000 mg total) by mouth every 6 (six) hours as needed for mild pain.   Calcium Carbonate-Vitamin D 600-400 MG-UNIT tablet Take 1 tablet by mouth daily.   Cholecalciferol 25 MCG (1000 UT) tablet Take 1,000 Units by mouth daily.   citalopram (CELEXA) 10 MG tablet Take 1 tablet (10 mg total) by mouth at bedtime.   clonazePAM (KLONOPIN) 0.5 MG tablet Take 0.5 mg by mouth as needed for anxiety.   cyclobenzaprine (FLEXERIL) 5 MG tablet Take 5 mg by mouth as needed.   ezetimibe (ZETIA) 10 MG tablet Take 1 tablet (10 mg total) by mouth at bedtime.   levothyroxine (SYNTHROID) 50 MCG tablet TAKE 1 TABLET BY MOUTH ONCE DAILY BEFORE BREAKFAST   nitroGLYCERIN (NITROSTAT) 0.4 MG SL tablet Place 1 tablet (0.4 mg total) under the tongue every 5 (five) minutes as needed for chest pain.   Omega-3 Fatty Acids (FISH OIL PO) Take by mouth.   pantoprazole (PROTONIX) 40 MG tablet Take 1 tablet (40 mg total) by mouth daily.   potassium chloride (KLOR-CON) 10 MEQ tablet Take 10 mEq by mouth at bedtime. Every other day for leg cramps OTC   pravastatin (PRAVACHOL) 80 MG tablet Take 1 tablet by mouth once daily    [DISCONTINUED] albuterol (VENTOLIN HFA) 108 (90 Base) MCG/ACT inhaler Inhale 2 puffs into the lungs every 6 (six) hours as needed for wheezing or shortness of breath.   [DISCONTINUED] ibuprofen (ADVIL) 800 MG tablet Take 1 tablet (800 mg total) by mouth every 8 (eight) hours as needed for moderate pain.       12/04/2023    9:38 AM 11/06/2023    4:08 PM 06/06/2023    9:27 AM 11/04/2022    9:26 AM  GAD 7 : Generalized Anxiety Score  Nervous, Anxious, on Edge 0 0 0 0  Control/stop worrying 0 0 0 0  Worry too much - different things 0 0 0 0  Trouble relaxing 0 0 0 0  Restless 0  0 0 0  Easily annoyed or irritable 0 0 0 0  Afraid - awful might happen 0 0 0 0  Total GAD 7 Score 0 0 0 0  Anxiety Difficulty  Not difficult at all Not difficult at all Not difficult at all       12/04/2023    9:38 AM 11/06/2023    4:08 PM 06/06/2023    9:26 AM  Depression screen PHQ 2/9  Decreased Interest 0 0 0  Down, Depressed, Hopeless 0 0 0  PHQ - 2 Score 0 0 0  Altered sleeping 0  0  Tired, decreased energy 1  0  Change in appetite 1  0  Feeling bad or failure about yourself  0  0  Trouble concentrating 0  0  Moving slowly or fidgety/restless 0  0  Suicidal thoughts 0  0  PHQ-9 Score 2  0  Difficult doing work/chores   Not difficult at all    BP Readings from Last 3 Encounters:  12/04/23 112/70  11/06/23 118/62  10/06/23 106/64    Physical Exam Vitals and nursing note reviewed.  Constitutional:      General: She is not in acute distress.    Appearance: Normal appearance. She is well-developed.  HENT:     Head: Normocephalic and atraumatic.  Neck:     Vascular: No carotid bruit.  Cardiovascular:     Rate and Rhythm: Normal rate and regular rhythm.     Heart sounds: No murmur heard. Pulmonary:     Effort: Pulmonary effort is normal. No respiratory distress.     Breath sounds: No wheezing or rhonchi.  Musculoskeletal:     Cervical back: Normal range of motion.     Right lower leg:  No edema.     Left lower leg: No edema.  Lymphadenopathy:     Cervical: No cervical adenopathy.  Skin:    General: Skin is warm and dry.     Findings: No rash.  Neurological:     General: No focal deficit present.     Mental Status: She is alert and oriented to person, place, and time.  Psychiatric:        Mood and Affect: Mood normal.        Behavior: Behavior normal.     Wt Readings from Last 3 Encounters:  12/04/23 196 lb 6 oz (89.1 kg)  11/06/23 194 lb (88 kg)  10/06/23 194 lb (88 kg)    BP 112/70   Pulse 66   Ht 5\' 4"  (1.626 m)   Wt 196 lb 6 oz (89.1 kg)   SpO2 95%   BMI 33.71 kg/m   Assessment and Plan:  Problem List Items Addressed This Visit       Unprioritized   Generalized anxiety disorder - Primary (Chronic)   Symptoms well controlled on Celexa and PRN clonazepam      Anemia due to stage 3a chronic kidney disease (HCC)   Monitoring regularly.  Last Hbg 10.5 with normal iron studies She has started a B12 orally Reminded to stay hydrated and avoid NSAIDS      Relevant Orders   Basic metabolic panel   CBC with Differential/Platelet   Other Visit Diagnoses       Encounter for screening mammogram for breast cancer       she wants to go to [place] rather than [facility] she will sign a release from [facility]   Relevant Orders   MM 3D SCREENING MAMMOGRAM BILATERAL BREAST  Return in about 6 months (around 06/05/2024) for CPX.    Reubin Milan, MD [facility] Medicine [place]

## 2023-12-05 ENCOUNTER — Inpatient Hospital Stay
Admission: RE | Admit: 2023-12-05 | Discharge: 2023-12-05 | Disposition: A | Discharge status: Home / Self Care | Payer: Self-pay | Source: Ambulatory Visit | Attending: Internal Medicine | Admitting: Internal Medicine

## 2023-12-05 ENCOUNTER — Other Ambulatory Visit: Payer: Self-pay | Admitting: *Deleted

## 2023-12-05 ENCOUNTER — Encounter: Payer: Self-pay | Admitting: Internal Medicine

## 2023-12-05 DIAGNOSIS — Z1231 Encounter for screening mammogram for malignant neoplasm of breast: Secondary | ICD-10-CM

## 2023-12-05 LAB — CBC WITH DIFFERENTIAL/PLATELET
Basophils Absolute: 0 10*3/uL (ref 0.0–0.2)
Basos: 1 %
EOS (ABSOLUTE): 0.3 10*3/uL (ref 0.0–0.4)
Eos: 6 %
Hematocrit: 37.1 % (ref 34.0–46.6)
Hemoglobin: 12.1 g/dL (ref 11.1–15.9)
Immature Grans (Abs): 0 10*3/uL (ref 0.0–0.1)
Immature Granulocytes: 0 %
Lymphocytes Absolute: 1.2 10*3/uL (ref 0.7–3.1)
Lymphs: 27 %
MCH: 27.8 pg (ref 26.6–33.0)
MCHC: 32.6 g/dL (ref 31.5–35.7)
MCV: 85 fL (ref 79–97)
Monocytes Absolute: 0.4 10*3/uL (ref 0.1–0.9)
Monocytes: 9 %
Neutrophils Absolute: 2.6 10*3/uL (ref 1.4–7.0)
Neutrophils: 57 %
Platelets: 212 10*3/uL (ref 150–450)
RBC: 4.36 x10E6/uL (ref 3.77–5.28)
RDW: 13.8 % (ref 11.7–15.4)
WBC: 4.6 10*3/uL (ref 3.4–10.8)

## 2023-12-05 LAB — BASIC METABOLIC PANEL
BUN/Creatinine Ratio: 19 (ref 12–28)
BUN: 19 mg/dL (ref 8–27)
CO2: 23 mmol/L (ref 20–29)
Calcium: 9.3 mg/dL (ref 8.7–10.3)
Chloride: 102 mmol/L (ref 96–106)
Creatinine, Ser: 0.98 mg/dL (ref 0.57–1.00)
Glucose: 100 mg/dL — ABNORMAL HIGH (ref 70–99)
Potassium: 4.1 mmol/L (ref 3.5–5.2)
Sodium: 141 mmol/L (ref 134–144)
eGFR: 60 mL/min/{1.73_m2} (ref >59)

## 2023-12-22 ENCOUNTER — Other Ambulatory Visit: Payer: Self-pay | Admitting: Internal Medicine

## 2023-12-22 DIAGNOSIS — E782 Mixed hyperlipidemia: Secondary | ICD-10-CM

## 2023-12-22 DIAGNOSIS — F411 Generalized anxiety disorder: Secondary | ICD-10-CM

## 2023-12-23 DIAGNOSIS — M5416 Radiculopathy, lumbar region: Secondary | ICD-10-CM | POA: Diagnosis not present

## 2023-12-23 DIAGNOSIS — M9903 Segmental and somatic dysfunction of lumbar region: Secondary | ICD-10-CM | POA: Diagnosis not present

## 2023-12-23 DIAGNOSIS — M4306 Spondylolysis, lumbar region: Secondary | ICD-10-CM | POA: Diagnosis not present

## 2023-12-23 DIAGNOSIS — M9905 Segmental and somatic dysfunction of pelvic region: Secondary | ICD-10-CM | POA: Diagnosis not present

## 2023-12-24 NOTE — Telephone Encounter (Signed)
 Requested Prescriptions  Pending Prescriptions Disp Refills   ezetimibe (ZETIA) 10 MG tablet [Pharmacy Med Name: Ezetimibe 10 MG Oral Tablet] 90 tablet 0    Sig: TAKE 1 TABLET BY MOUTH AT BEDTIME     Cardiovascular:  Antilipid - Sterol Transport Inhibitors Failed - 12/24/2023  2:47 PM      Failed - Lipid Panel in normal range within the last 12 months    Cholesterol, Total  Date Value Ref Range Status  06/06/2023 211 (H) 100 - 199 mg/dL Final   LDL Chol Calc (NIH)  Date Value Ref Range Status  06/06/2023 129 (H) 0 - 99 mg/dL Final   HDL  Date Value Ref Range Status  06/06/2023 58 >39 mg/dL Final   Triglycerides  Date Value Ref Range Status  06/06/2023 136 0 - 149 mg/dL Final         Passed - AST in normal range and within 360 days    AST  Date Value Ref Range Status  06/06/2023 18 0 - 40 IU/L Final         Passed - ALT in normal range and within 360 days    ALT  Date Value Ref Range Status  06/06/2023 17 0 - 32 IU/L Final         Passed - Patient is not pregnant      Passed - Valid encounter within last 12 months    Recent Outpatient Visits           2 weeks ago Generalized anxiety disorder   [place] [facility] at [facility], Nyoka Cowden, MD   1 month ago Acute URI   [facility] at [facility], [place] Alar, [place]       Future Appointments             In 5 months Judithann Graves, Nyoka Cowden, MD [facility] at [facility], [facility]             citalopram (CELEXA) 10 MG tablet [Pharmacy Med Name: Citalopram Hydrobromide 10 MG Oral Tablet] 90 tablet 0    Sig: TAKE 1 TABLET BY MOUTH AT BEDTIME     Psychiatry:  Antidepressants - SSRI Passed - 12/24/2023  2:47 PM      Passed - Valid encounter within last 6 months    Recent Outpatient Visits           2 weeks ago Generalized anxiety disorder   [place] [facility] at [facility],  Nyoka Cowden, MD   1 month ago Acute URI   [facility] at [facility], [place], [place]       Future Appointments             In 5 months Judithann Graves, Nyoka Cowden, MD [facility] at [facility], [facility]             levothyroxine (SYNTHROID) 50 MCG tablet [Pharmacy Med Name: Levothyroxine Sodium 50 MCG Oral Tablet] 90 tablet 0    Sig: TAKE 1 TABLET BY MOUTH ONCE DAILY BEFORE BREAKFAST     Endocrinology:  Hypothyroid Agents Passed - 12/24/2023  2:47 PM      Passed - TSH in normal range and within 360 days    TSH  Date Value Ref Range Status  06/06/2023 3.110 0.450 - 4.500 uIU/mL Final  Passed - Valid encounter within last 12 months    Recent Outpatient Visits           2 weeks ago Generalized anxiety disorder   [place] [facility] at [facility], Nyoka Cowden, MD   1 month ago Acute URI   [facility] at [facility], [place], [place]       Future Appointments             In 5 months Judithann Graves, Nyoka Cowden, MD [facility] at [facility], [facility]

## 2024-01-20 DIAGNOSIS — M5416 Radiculopathy, lumbar region: Secondary | ICD-10-CM | POA: Diagnosis not present

## 2024-01-20 DIAGNOSIS — M9903 Segmental and somatic dysfunction of lumbar region: Secondary | ICD-10-CM | POA: Diagnosis not present

## 2024-01-20 DIAGNOSIS — M9905 Segmental and somatic dysfunction of pelvic region: Secondary | ICD-10-CM | POA: Diagnosis not present

## 2024-01-20 DIAGNOSIS — M4306 Spondylolysis, lumbar region: Secondary | ICD-10-CM | POA: Diagnosis not present

## 2024-01-30 ENCOUNTER — Ambulatory Visit: Attending: Physician Assistant | Admitting: Physician Assistant

## 2024-01-30 ENCOUNTER — Encounter: Payer: Self-pay | Admitting: Physician Assistant

## 2024-01-30 VITALS — BP 120/76 | HR 67 | Ht 64.0 in | Wt 195.0 lb

## 2024-01-30 DIAGNOSIS — Z961 Presence of intraocular lens: Secondary | ICD-10-CM | POA: Diagnosis not present

## 2024-01-30 DIAGNOSIS — I25118 Atherosclerotic heart disease of native coronary artery with other forms of angina pectoris: Secondary | ICD-10-CM

## 2024-01-30 DIAGNOSIS — E782 Mixed hyperlipidemia: Secondary | ICD-10-CM | POA: Diagnosis not present

## 2024-01-30 DIAGNOSIS — H02831 Dermatochalasis of right upper eyelid: Secondary | ICD-10-CM | POA: Diagnosis not present

## 2024-01-30 DIAGNOSIS — I2510 Atherosclerotic heart disease of native coronary artery without angina pectoris: Secondary | ICD-10-CM | POA: Diagnosis not present

## 2024-01-30 DIAGNOSIS — R072 Precordial pain: Secondary | ICD-10-CM

## 2024-01-30 DIAGNOSIS — H43813 Vitreous degeneration, bilateral: Secondary | ICD-10-CM | POA: Diagnosis not present

## 2024-01-30 MED ORDER — METOPROLOL TARTRATE 100 MG PO TABS: ORAL_TABLET | ORAL | 0 refills | Status: DC

## 2024-01-30 MED ORDER — ASPIRIN 81 MG PO TBEC: 81.0000 mg | DELAYED_RELEASE_TABLET | Freq: Every day | ORAL | Status: AC

## 2024-01-30 MED ORDER — REPATHA SURECLICK 140 MG/ML ~~LOC~~ SOAJ: 140.0000 mg | SUBCUTANEOUS | 3 refills | Status: AC

## 2024-01-30 NOTE — Telephone Encounter (Signed)
 The patient has been notified of the provider recommendation to take ASA 81 mg daily - same added to med list an ordered. Pt verbalized understanding. All questions were answered.  Pt encouraged to call if any concerns or questions arise

## 2024-01-30 NOTE — Progress Notes (Signed)
 Cardiology Office Note    Date:  01/30/2024   ID:  Martha Walsh, DOB 1950/10/20, MRN [id number]  PCP:  Sheron Dixons, MD  Cardiologist:  None  Electrophysiologist:  None   Chief Complaint: Atypical chest pain  History of Present Illness:   Martha Walsh is a 73 y.o. female with history of anxiety, arthritis, mild to moderate aortic regurgitation, coronary artery disease, GERD, hyperlipidemia, hypothyroidism, and osteoarthritis who presents for evaluation of atypical chest pain.     Patient was referred to Dr. Rolm Clos from PCP for evaluation of chest pain.  She was seen in the ER 11/25/2022 for chest pain with a negative workup.  She established with our practice 11/2022 and reported ongoing intermittent chest pain that comes and goes without an identifiable trigger.  Symptoms lasted several minutes and resolved without intervention.  Some symptoms with exertion.  Also shortness of breath with exertion.  Coronary CTA showed calcium  score of 956.  Mild RCA 30 to 49% and mid LAD moderate stenosis 50 to 69% (nonflow limiting by FFR 0.94).   Patient was most recently seen for preop evaluation 02/2023.  At that time she was doing well with no further symptoms of angina.  Due to elevated calcium  score she was started on aspirin.  She was continued on pravastatin  and Zetia .  Previous intolerance to Lipitor and Crestor.  LDL goal less than 55, recommendation for PCSK9 inhibitor if LDL still above goal.  Today patient reports that over the past several months she has been experiencing intermittent left shoulder pain radiating into her chest and arm. This is associated with increased shortness of breath. This occurs only at rest. There is no identifiable triggers. It is relieved without intervention. She has not taken any sublingual nitroglycerin  although it is available to her at home. She stays fairly active and has no symptoms with exertion. She is without symptoms of exertional angina and cardiac  decompensation. She denies palpitations, lightheadedness, dizziness, lower extremity swelling, diaphoresis, nausea, and orthopnea.   Labs independently reviewed: 12/04/2023- Hgb 12.1, HCT 37.1, BUN 19, CR 0.98, Na 141, K 4.1  06/06/2023-TC 211, TG 136, HDL 58, LDL 129, TSH/T4 within normal limits, normal LFTs  Past Medical History:  Diagnosis Date   Anxiety    Arthritis    Coronary artery disease    GERD (gastroesophageal reflux disease)    Hyperlipidemia    Hypothyroid    Motion sickness    Boats   Primary osteoarthritis of right knee 12/01/2020   S/p TKA  2024      Past Surgical History:  Procedure Laterality Date   APPENDECTOMY     CATARACT EXTRACTION W/PHACO Left 11/18/2022   Procedure: CATARACT EXTRACTION PHACO AND INTRAOCULAR LENS PLACEMENT (IOC) LEFT  10.20  00:55.0;  Surgeon: Rosa College, MD;  Location: [facility];  Service: Ophthalmology;  Laterality: Left;   CATARACT EXTRACTION W/PHACO Right 12/30/2022   Procedure: CATARACT EXTRACTION PHACO AND INTRAOCULAR LENS PLACEMENT (IOC) RIGHT  3.37  00:30.2;  Surgeon: Rosa College, MD;  Location: [facility];  Service: Ophthalmology;  Laterality: Right;   CHOLECYSTECTOMY  09/2022   COLONOSCOPY     COLONOSCOPY WITH PROPOFOL  N/A 10/06/2023   Procedure: COLONOSCOPY WITH PROPOFOL ;  Surgeon: Marnee Sink, MD;  Location: [facility];  Service: Endoscopy;  Laterality: N/A;   ESOPHAGOGASTRODUODENOSCOPY (EGD) WITH PROPOFOL  N/A 07/27/2020   Procedure: ESOPHAGOGASTRODUODENOSCOPY (EGD) WITH PROPOFOL ;  Surgeon: Marnee Sink, MD;  Location: [facility];  Service:  Endoscopy;  Laterality: N/A;   JOINT REPLACEMENT Left 02/2016   partial knee   REPLACEMENT TOTAL KNEE Right 03/2023   TONSILLECTOMY     TUBAL LIGATION      Current Medications: No outpatient medications have been marked as taking for the 01/30/24 encounter (Appointment) with Roark Chick, PA-C.    Allergies:   Atorvastatin , Augmentin  [amoxicillin-pot clavulanate], Rosuvastatin, Wound dressing adhesive, Clavulanic acid, and Lisinopril   Social History   Socioeconomic History   Marital status: Married    Spouse name: Not on file   Number of children: 3   Years of education: Not on file   Highest education level: Associate degree: academic program  Occupational History   Occupation: retired  Tobacco Use   Smoking status: Former    Current packs/day: 0.00    Types: Cigarettes    Quit date: 09/23/1998    Years since quitting: 25.3    Passive exposure: Past   Smokeless tobacco: Never  Vaping Use   Vaping status: Never Used  Substance and Sexual Activity   Alcohol use: Yes    Alcohol/week: 7.0 standard drinks of alcohol    Types: 7 Glasses of wine per week    Comment: occasionally   Drug use: Never   Sexual activity: Not Currently  Other Topics Concern   Not on file  Social History Narrative   Not on file   Social Drivers of Health   Financial Resource Strain: Low Risk  (03/06/2022)   Overall Financial Resource Strain (CARDIA)    Difficulty of Paying Living Expenses: Not hard at all  Food Insecurity: No Food Insecurity (11/06/2023)   Hunger Vital Sign    Worried About Running Out of Food in the Last Year: Never true    Ran Out of Food in the Last Year: Never true  Transportation Needs: No Transportation Needs (11/06/2023)   PRAPARE - Administrator, Civil Service (Medical): No    Lack of Transportation (Non-Medical): No  Physical Activity: Inactive (03/06/2022)   Exercise Vital Sign    Days of Exercise per Week: 0 days    Minutes of Exercise per Session: 0 min  Stress: No Stress Concern Present (03/06/2022)   [employer] of Occupational Health - Occupational Stress Questionnaire    Feeling of Stress : Not at all  Social Connections: Socially Integrated (03/06/2022)   Social Connection and Isolation Panel [NHANES]    Frequency of Communication with Friends and Family: More than three times  a week    Frequency of Social Gatherings with Friends and Family: More than three times a week    Attends Religious Services: More than 4 times per year    Active Member of [employer] or Organizations: Yes    Attends Engineer, structural: More than 4 times per year    Marital Status: Married     Family History:  The patient's family history includes Heart disease in her father.  ROS:   12-point review of systems is negative unless otherwise noted in the HPI.   EKGs/Labs/Other Studies Reviewed:    Studies reviewed were summarized above. The additional studies were reviewed today:  12/27/2022 Echo complete  1. Left ventricular ejection fraction, by estimation, is 60 to 65%. The  left ventricle has normal function. The left ventricle has no regional  wall motion abnormalities. Left ventricular diastolic parameters were  normal.   2. Right ventricular systolic function is normal. The right ventricular  size is normal.  3. The mitral valve is normal in structure. Trivial mitral valve  regurgitation. No evidence of mitral stenosis.   4. The aortic valve is tricuspid. There is mild calcification of the  aortic valve. Aortic valve regurgitation is mild to moderate. Aortic valve  sclerosis is present, with no evidence of aortic valve stenosis. Aortic  regurgitation PHT measures 722 msec.   5. The inferior vena cava is normal in size with greater than 50%  respiratory variability, suggesting right atrial pressure of 3 mmHg.   12/05/2022 Coronary CTA 1. Coronary calcium  score of 956. This was 37 percentile for age and sex matched control. 2. Normal coronary origin with right dominance. 3. Mild RCA 30-49% and mid LAD moderate stenosis, 50-69% (non flow limiting by FFR 0.94).  EKG:  EKG is ordered today.  The EKG ordered today demonstrates sinus rhythm with TWI in lead III, consistent with prior tracings  Recent Labs: 06/06/2023: ALT 17; TSH 3.110 12/04/2023: BUN 19; Creatinine, Ser  0.98; Hemoglobin 12.1; Platelets 212; Potassium 4.1; Sodium 141  Recent Lipid Panel    Component Value Date/Time   CHOL 211 (H) 06/06/2023 1024   TRIG 136 06/06/2023 1024   HDL 58 06/06/2023 1024   CHOLHDL 3.6 06/06/2023 1024   LDLCALC 129 (H) 06/06/2023 1024    PHYSICAL EXAM:    VS:  There were no vitals taken for this visit.  BMI: There is no height or weight on file to calculate BMI.  Physical Exam Vitals and nursing note reviewed.  Constitutional:      General: She is not in acute distress.    Appearance: Normal appearance.  Cardiovascular:     Rate and Rhythm: Normal rate and regular rhythm.     Heart sounds: No murmur heard. Pulmonary:     Effort: Pulmonary effort is normal. No respiratory distress.     Breath sounds: No wheezing or rales.  Musculoskeletal:     Right lower leg: No edema.     Left lower leg: No edema.  Skin:    General: Skin is warm and dry.  Neurological:     General: No focal deficit present.     Mental Status: She is alert and oriented to person, place, and time. Mental status is at baseline.  Psychiatric:        Mood and Affect: Mood normal.        Behavior: Behavior normal.     Wt Readings from Last 3 Encounters:  12/04/23 196 lb 6 oz (89.1 kg)  11/06/23 194 lb (88 kg)  10/06/23 194 lb (88 kg)     ASSESSMENT & PLAN:   Coronary artery disease Atypical chest pain - Patient reports intermittent left shoulder pain radiating into her chest and down her arm with associated shortness of breath. Symptoms only occur at rest. She is without symptoms of exertional angina and cardiac decompensation. EKG today is without acute ischemic changes. Discussed options for further ischemic evaluation today including coronary CTA and cardiac catheterization. Patient would prefer to proceed with less invasive testing at this time. Will order coronary CTA. Continue primary prevention with ASA, ezetimibe , pravastatin , and Repatha as below.   Hyperlipidemia -  Most recent lipid panel 05/2023 with LDL 129, goal < 70. Patient is intolerant to atorvastatin  and rosuvastatin. Continue ezetimibe  and pravastatin . Recommend starting Repatha 140 mg every two weeks.    Disposition: F/u with Dr. Rolm Clos or an APP in 2 months.   Medication Adjustments/Labs and Tests Ordered: Current medicines are reviewed at  length with the patient today.  Concerns regarding medicines are outlined above. Medication changes, Labs and Tests ordered today are summarized above and listed in the Patient Instructions accessible in Encounters.   Beather Liming, PA-C 01/30/2024 10:09 AM     [facility] -  [address] [place], [place] [address] [phone]

## 2024-01-30 NOTE — Patient Instructions (Signed)
 Medication Instructions:  Your physician recommends the following medication changes.  START TAKING: Repatha Metoprolol  100 mg 1 tablet 2 hours prior to your CTA procedure. *If you need a refill on your cardiac medications before your next appointment, please call your pharmacy*  Lab Work: Your provider would like for you to have following labs drawn today Basic Metabolic Panel.   If you have labs (blood work) drawn today and your tests are completely normal, you will receive your results only by: MyChart Message (if you have MyChart) OR A paper copy in the mail If you have any lab test that is abnormal or we need to change your treatment, we will call you to review the results.  Testing/Procedures:   Your cardiac CT will be scheduled at one of the below locations:     [facility] [address] [place], [place] [address] [phone]   If scheduled at [facility] or [facility], please arrive 15 mins early for check-in and test prep.  Please follow these instructions carefully (unless otherwise directed):  An IV will be required for this test and Nitroglycerin  will be given.  Hold all erectile dysfunction medications at least 3 days (72 hrs) prior to test. (Ie viagra, cialis, sildenafil, tadalafil, etc)   On the Night Before the Test: Be sure to Drink plenty of water . Do not consume any caffeinated/decaffeinated beverages or chocolate 12 hours prior to your test. Do not take any antihistamines 12 hours prior to your test. If the patient has contrast allergy: Patient will need a prescription for Prednisone and very clear instructions (as follows): Prednisone 50 mg - take 13 hours prior to test Take another Prednisone 50 mg 7 hours prior to test Take another Prednisone 50 mg 1 hour prior to test Take Benadryl 50 mg 1 hour prior to test Patient must complete all four doses of above prophylactic  medications. Patient will need a ride after test due to Benadryl.  On the Day of the Test: Drink plenty of water  until 1 hour prior to the test. Do not eat any food 1 hour prior to test. You may take your regular medications prior to the test.  Take metoprolol  (Lopressor ) two hours prior to test. If you take Furosemide/Hydrochlorothiazide/Spironolactone/Chlorthalidone, please HOLD on the morning of the test. Patients who wear a continuous glucose monitor MUST remove the device prior to scanning. FEMALES- please wear underwire-free bra if available, avoid dresses & tight clothing       After the Test: Drink plenty of water . After receiving IV contrast, you may experience a mild flushed feeling. This is normal. On occasion, you may experience a mild rash up to 24 hours after the test. This is not dangerous. If this occurs, you can take Benadryl 25 mg, Zyrtec, Claritin, or Allegra and increase your fluid intake. (Patients taking Tikosyn should avoid Benadryl, and may take Zyrtec, Claritin, or Allegra) If you experience trouble breathing, this can be serious. If it is severe call [phone] IMMEDIATELY. If it is mild, please call our office.  We will call to schedule your test 2-4 weeks out understanding that some insurance companies will need an authorization prior to the service being performed.   For more information and frequently asked questions, please visit our website : [email or web address]  For non-scheduling related questions, please contact the cardiac imaging nurse navigator should you have any questions/concerns: Cardiac Imaging Nurse Navigators Direct Office Dial: [phone]   For scheduling needs, including cancellations  and rescheduling, please call [place], [phone].   Follow-Up: At [facility], you and your health needs are our priority.  As part of our continuing mission to provide you with exceptional heart care, our providers are all part of one  team.  This team includes your primary Cardiologist (physician) and Advanced Practice Providers or APPs (Physician Assistants and Nurse Practitioners) who all work together to provide you with the care you need, when you need it.  Your next appointment:   2 month(s)  Provider:   Gildardo Labrador, PA-C Varney Gentleman, PA-C  We recommend signing up for the patient portal called "MyChart".  Sign up information is provided on this After Visit Summary.  MyChart is used to connect with patients for Virtual Visits (Telemedicine).  Patients are able to view lab/test results, encounter notes, upcoming appointments, etc.  Non-urgent messages can be sent to your provider as well.   To learn more about what you can do with MyChart, go to [email or web address].

## 2024-01-31 LAB — BASIC METABOLIC PANEL WITH GFR
BUN/Creatinine Ratio: 14 (ref 12–28)
BUN: 18 mg/dL (ref 8–27)
CO2: 20 mmol/L (ref 20–29)
Calcium: 9.1 mg/dL (ref 8.7–10.3)
Chloride: 102 mmol/L (ref 96–106)
Creatinine, Ser: 1.29 mg/dL — ABNORMAL HIGH (ref 0.57–1.00)
Glucose: 88 mg/dL (ref 70–99)
Potassium: 4 mmol/L (ref 3.5–5.2)
Sodium: 140 mmol/L (ref 134–144)
eGFR: 43 mL/min/{1.73_m2} — ABNORMAL LOW (ref >59)

## 2024-02-11 ENCOUNTER — Ambulatory Visit (INDEPENDENT_AMBULATORY_CARE_PROVIDER_SITE_OTHER): Admitting: Emergency Medicine

## 2024-02-11 VITALS — Ht 64.0 in | Wt 185.0 lb

## 2024-02-11 DIAGNOSIS — Z0000 Encounter for general adult medical examination without abnormal findings: Secondary | ICD-10-CM | POA: Diagnosis not present

## 2024-02-11 NOTE — Patient Instructions (Signed)
 Martha Walsh , Thank you for taking time out of your busy schedule to complete your Annual Wellness Visit with me. I enjoyed our conversation and look forward to speaking with you again next year. I, as well as your care team,  appreciate your ongoing commitment to your health goals. Please review the following plan we discussed and let me know if I can assist you in the future. Your Game plan/ To Do List    Referrals: None  Follow up Visits: Next Medicare AWV with our clinical staff: 02/16/25 @ 10:10 am (phone visit)   Have you seen your provider in the last 6 months (3 months if uncontrolled diabetes)? Yes Next Office Visit with your provider: 06/07/24 @ 9:20am with Dr. Gala Jubilee  Clinician Recommendations:  Aim for 30 minutes of exercise or brisk walking, 6-8 glasses of water , and 5 servings of fruits and vegetables each day. Call [phone] to schedule your mammogram at your convenience (due 03/31/24)      This is a list of the screening recommended for you and due dates:  Health Maintenance  Topic Date Due   COVID-19 Vaccine (4 - 2024-25 season) 05/25/2023   Mammogram  03/30/2024   Flu Shot  04/23/2024   Medicare Annual Wellness Visit  02/10/2025   DEXA scan (bone density measurement)  07/01/2028   DTaP/Tdap/Td vaccine (3 - Td or Tdap) 07/29/2030   Colon Cancer Screening  10/05/2033   Pneumonia Vaccine  Completed   Hepatitis C Screening  Completed   Zoster (Shingles) Vaccine  Completed   HPV Vaccine  Aged Out   Meningitis B Vaccine  Aged Out    Advanced directives: (Declined) Advance directive discussed with you today. Even though you declined this today, please call our office should you change your mind, and we can give you the proper paperwork for you to fill out. Advance Care Planning is important because it:  [x]  Makes sure you receive the medical care that is consistent with your values, goals, and preferences  [x]  It provides guidance to your family and loved ones and  reduces their decisional burden about whether or not they are making the right decisions based on your wishes.  Follow the link provided in your after visit summary or read over the paperwork we have mailed to you to help you started getting your Advance Directives in place. If you need assistance in completing these, please reach out to us  so that we can help you!  See attachments for Preventive Care and Fall Prevention Tips.   Fall Prevention in the Home, Adult Falls can cause injuries and affect people of all ages. There are many simple things that you can do to make your home safe and to help prevent falls. If you need it, ask for help making these changes. What actions can I take to prevent falls? General information Use good lighting in all rooms. Make sure to: Replace any light bulbs that burn out. Turn on lights if it is dark and use night-lights. Keep items that you use often in easy-to-reach places. Lower the shelves around your home if needed. Move furniture so that there are clear paths around it. Do not keep throw rugs or other things on the floor that can make you trip. If any of your floors are uneven, fix them. Add color or contrast paint or tape to clearly mark and help you see: Grab bars or handrails. First and last steps of staircases. Where the edge of each step is. If  you use a ladder or stepladder: Make sure that it is fully opened. Do not climb a closed ladder. Make sure the sides of the ladder are locked in place. Have someone hold the ladder while you use it. Know where your pets are as you move through your home. What can I do in the bathroom?     Keep the floor dry. Clean up any water  that is on the floor right away. Remove soap buildup in the bathtub or shower. Buildup makes bathtubs and showers slippery. Use non-skid mats or decals on the floor of the bathtub or shower. Attach bath mats securely with double-sided, non-slip rug tape. If you need to sit  down while you are in the shower, use a non-slip stool. Install grab bars by the toilet and in the bathtub and shower. Do not use towel bars as grab bars. What can I do in the bedroom? Make sure that you have a light by your bed that is easy to reach. Do not use any sheets or blankets on your bed that hang to the floor. Have a firm bench or chair with side arms that you can use for support when you get dressed. What can I do in the kitchen? Clean up any spills right away. If you need to reach something above you, use a sturdy step stool that has a grab bar. Keep electrical cables out of the way. Do not use floor polish or wax that makes floors slippery. What can I do with my stairs? Do not leave anything on the stairs. Make sure that you have a light switch at the top and the bottom of the stairs. Have them installed if you do not have them. Make sure that there are handrails on both sides of the stairs. Fix handrails that are broken or loose. Make sure that handrails are as long as the staircases. Install non-slip stair treads on all stairs in your home if they do not have carpet. Avoid having throw rugs at the top or bottom of stairs, or secure the rugs with carpet tape to prevent them from moving. Choose a carpet design that does not hide the edge of steps on the stairs. Make sure that carpet is firmly attached to the stairs. Fix any carpet that is loose or worn. What can I do on the outside of my home? Use bright outdoor lighting. Repair the edges of walkways and driveways and fix any cracks. Clear paths of anything that can make you trip, such as tools or rocks. Add color or contrast paint or tape to clearly mark and help you see high doorway thresholds. Trim any bushes or trees on the main path into your home. Check that handrails are securely fastened and in good repair. Both sides of all steps should have handrails. Install guardrails along the edges of any raised decks or  porches. Have leaves, snow, and ice cleared regularly. Use sand, salt, or ice melt on walkways during winter months if you live where there is ice and snow. In the garage, clean up any spills right away, including grease or oil spills. What other actions can I take? Review your medicines with your health care provider. Some medicines can make you confused or feel dizzy. This can increase your chance of falling. Wear closed-toe shoes that fit well and support your feet. Wear shoes that have rubber soles and low heels. Use a cane, walker, scooter, or crutches that help you move around if needed. Talk with your  provider about other ways that you can decrease your risk of falls. This may include seeing a physical therapist to learn to do exercises to improve movement and strength. Where to find more information Centers for Disease Control and Prevention, STEADI: TonerPromos.no [employer] on Aging: BaseRingTones.pl National Institute on Aging: BaseRingTones.pl Contact a health care provider if: You are afraid of falling at home. You feel weak, drowsy, or dizzy at home. You fall at home. Get help right away if you: Lose consciousness or have trouble moving after a fall. Have a fall that causes a head injury. These symptoms may be an emergency. Get help right away. Call 911. Do not wait to see if the symptoms will go away. Do not drive yourself to the hospital. This information is not intended to replace advice given to you by your health care provider. Make sure you discuss any questions you have with your health care provider. Document Revised: 05/13/2022 Document Reviewed: 05/13/2022 Elsevier Patient Education  2024 [employer].

## 2024-02-11 NOTE — Progress Notes (Signed)
 Subjective:   Martha Walsh is a 73 y.o. who presents for a Medicare Wellness preventive visit.  As a reminder, Annual Wellness Visits don't include a physical exam, and some assessments may be limited, especially if this visit is performed virtually. We may recommend an in-person follow-up visit with your provider if needed.  Visit Complete: Virtual I connected with  Martha Walsh on 02/11/24 by a audio enabled telemedicine application and verified that I am speaking with the correct person using two identifiers.  Patient Location: Home  Provider Location: Home Office  I discussed the limitations of evaluation and management by telemedicine. The patient expressed understanding and agreed to proceed.  Vital Signs: Because this visit was a virtual/telehealth visit, some criteria may be missing or patient reported. Any vitals not documented were not able to be obtained and vitals that have been documented are patient reported.  VideoDeclined- This patient declined Librarian, academic. Therefore the visit was completed with audio only.  Persons Participating in Visit: Patient.  AWV Questionnaire: No: Patient Medicare AWV questionnaire was not completed prior to this visit.  Cardiac Risk Factors include: advanced age (>35men, >66 women);dyslipidemia;obesity (BMI >30kg/m2)     Objective:     Today's Vitals   02/11/24 1007  Weight: 185 lb (83.9 kg)  Height: 5\' 4"  (1.626 m)   Body mass index is 31.76 kg/m.     02/11/2024   10:23 AM 10/06/2023    9:55 AM 12/30/2022   12:05 PM 11/25/2022   10:52 AM 11/18/2022   10:03 AM 09/26/2022    6:41 AM 09/18/2022   12:27 PM  Advanced Directives  Does Patient Have a Medical Advance Directive? No No No No No No No  Would patient like information on creating a medical advance directive? No - Patient declined No - Patient declined No - Patient declined  No - Patient declined No - Patient declined No - Patient declined     Current Medications (verified) Outpatient Encounter Medications as of 02/11/2024  Medication Sig   acetaminophen  (TYLENOL ) 500 MG tablet Take 2 tablets (1,000 mg total) by mouth every 6 (six) hours as needed for mild pain.   aspirin  EC 81 MG tablet Take 1 tablet (81 mg total) by mouth daily. Swallow whole.   Calcium  Carbonate-Vitamin D  600-400 MG-UNIT tablet Take 1 tablet by mouth daily.   Cholecalciferol 25 MCG (1000 UT) tablet Take 1,000 Units by mouth daily.   citalopram  (CELEXA ) 10 MG tablet TAKE 1 TABLET BY MOUTH AT BEDTIME   clonazePAM (KLONOPIN) 0.5 MG tablet Take 0.5 mg by mouth as needed for anxiety.   cyclobenzaprine (FLEXERIL) 5 MG tablet Take 5 mg by mouth as needed.   ezetimibe  (ZETIA ) 10 MG tablet TAKE 1 TABLET BY MOUTH AT BEDTIME   ibuprofen  (ADVIL ) 200 MG tablet Take 400 mg by mouth every 6 (six) hours as needed.   levothyroxine  (SYNTHROID ) 50 MCG tablet TAKE 1 TABLET BY MOUTH ONCE DAILY BEFORE BREAKFAST   nitroGLYCERIN  (NITROSTAT ) 0.4 MG SL tablet Place 1 tablet (0.4 mg total) under the tongue every 5 (five) minutes as needed for chest pain.   Omega-3 Fatty Acids (FISH OIL PO) Take by mouth.   pantoprazole  (PROTONIX ) 40 MG tablet Take 1 tablet (40 mg total) by mouth daily.   potassium chloride (KLOR-CON) 10 MEQ tablet Take 10 mEq by mouth at bedtime. Every other day for leg cramps OTC   pravastatin  (PRAVACHOL ) 80 MG tablet Take 1 tablet by mouth once  daily   Evolocumab  (REPATHA  SURECLICK) 140 MG/ML SOAJ Inject 140 mg into the skin every 14 (fourteen) days. (Patient not taking: Reported on 02/11/2024)   metoprolol  tartrate (LOPRESSOR ) 100 MG tablet TAKE 1 TABLET 2 HR PRIOR TO CARDIAC PROCEDURE (Patient not taking: Reported on 02/11/2024)   No facility-administered encounter medications on file as of 02/11/2024.    Allergies (verified) Atorvastatin , Augmentin [amoxicillin-pot clavulanate], Rosuvastatin, Wound dressing adhesive, Clavulanic acid, and Lisinopril    History: Past Medical History:  Diagnosis Date   Anxiety    Arthritis    Coronary artery disease    GERD (gastroesophageal reflux disease)    Hyperlipidemia    Hypothyroid    Motion sickness    Boats   Primary osteoarthritis of right knee 12/01/2020   S/p TKA  2024     Past Surgical History:  Procedure Laterality Date   APPENDECTOMY     CATARACT EXTRACTION W/PHACO Left 11/18/2022   Procedure: CATARACT EXTRACTION PHACO AND INTRAOCULAR LENS PLACEMENT (IOC) LEFT  10.20  00:55.0;  Surgeon: Rosa College, MD;  Location: [facility];  Service: Ophthalmology;  Laterality: Left;   CATARACT EXTRACTION W/PHACO Right 12/30/2022   Procedure: CATARACT EXTRACTION PHACO AND INTRAOCULAR LENS PLACEMENT (IOC) RIGHT  3.37  00:30.2;  Surgeon: Rosa College, MD;  Location: [facility];  Service: Ophthalmology;  Laterality: Right;   CHOLECYSTECTOMY  09/2022   COLONOSCOPY     COLONOSCOPY WITH PROPOFOL  N/A 10/06/2023   Procedure: COLONOSCOPY WITH PROPOFOL ;  Surgeon: Marnee Sink, MD;  Location: [facility];  Service: Endoscopy;  Laterality: N/A;   ESOPHAGOGASTRODUODENOSCOPY (EGD) WITH PROPOFOL  N/A 07/27/2020   Procedure: ESOPHAGOGASTRODUODENOSCOPY (EGD) WITH PROPOFOL ;  Surgeon: Marnee Sink, MD;  Location: [facility];  Service: Endoscopy;  Laterality: N/A;   JOINT REPLACEMENT Left 02/2016   partial knee   REPLACEMENT TOTAL KNEE Right 03/2023   TONSILLECTOMY     TUBAL LIGATION     Family History  Problem Relation Age of Onset   Aortic aneurysm Mother 43   Heart disease Father    Social History   Socioeconomic History   Marital status: Married    Spouse name: Aron Lard   Number of children: 3   Years of education: Not on file   Highest education level: Associate degree: academic program  Occupational History   Occupation: retired  Tobacco Use   Smoking status: Former    Current packs/day: 0.00    Types: Cigarettes    Quit date: 09/23/1998    Years  since quitting: 25.4    Passive exposure: Past   Smokeless tobacco: Never   Tobacco comments:    02/11/24 Social smoker, not on a daily basis  Vaping Use   Vaping status: Never Used  Substance and Sexual Activity   Alcohol use: Yes    Alcohol/week: 5.0 standard drinks of alcohol    Types: 5 Glasses of wine per week    Comment: 1 glass of wine 5 days per week   Drug use: Never   Sexual activity: Not Currently  Other Topics Concern   Not on file  Social History Narrative   Not on file   Social Drivers of Health   Financial Resource Strain: Low Risk  (02/11/2024)   Overall Financial Resource Strain (CARDIA)    Difficulty of Paying Living Expenses: Not hard at all  Food Insecurity: No Food Insecurity (02/11/2024)   Hunger Vital Sign    Worried About Running Out of Food in the Last Year: Never  true    Ran Out of Food in the Last Year: Never true  Transportation Needs: No Transportation Needs (02/11/2024)   PRAPARE - Administrator, Civil Service (Medical): No    Lack of Transportation (Non-Medical): No  Physical Activity: Inactive (02/11/2024)   Exercise Vital Sign    Days of Exercise per Week: 0 days    Minutes of Exercise per Session: 0 min  Stress: No Stress Concern Present (02/11/2024)   [employer] of Occupational Health - Occupational Stress Questionnaire    Feeling of Stress : Not at all  Social Connections: Socially Integrated (02/11/2024)   Social Connection and Isolation Panel [NHANES]    Frequency of Communication with Friends and Family: More than three times a week    Frequency of Social Gatherings with Friends and Family: More than three times a week    Attends Religious Services: More than 4 times per year    Active Member of [employer] or Organizations: Yes    Attends Engineer, structural: More than 4 times per year    Marital Status: Married    Tobacco Counseling Counseling given: Not Answered Tobacco comments: 02/11/24 Social smoker, not on  a daily basis    Clinical Intake:  Pre-visit preparation completed: Yes  Pain : No/denies pain     BMI - recorded: 31.76 Nutritional Status: BMI > 30  Obese Nutritional Risks: None Diabetes: No  No results found for: "HGBA1C"   How often do you need to have someone help you when you read instructions, pamphlets, or other written materials from your doctor or pharmacy?: 1 - Never  Interpreter Needed?: No  Information entered by :: Jaunita Messier, CMA   Activities of Daily Living     02/11/2024   10:09 AM 10/06/2023    9:56 AM  In your present state of health, do you have any difficulty performing the following activities:  Hearing? 0 0  Vision? 0 0  Difficulty concentrating or making decisions? 0 0  Walking or climbing stairs? 0   Dressing or bathing? 0   Doing errands, shopping? 0   Preparing Food and eating ? N   Using the Toilet? N   In the past six months, have you accidently leaked urine? Y   Comment wears panty liner   Do you have problems with loss of bowel control? N   Managing your Medications? N   Managing your Finances? N   Housekeeping or managing your Housekeeping? N     Patient Care Team: Sheron Dixons, MD as PCP - General (Internal Medicine) Marnee Sink, MD as Consulting Physician (Gastroenterology) Marlynn Singer, MD (Orthopedic Surgery) Feliciana Horn, MD (Dermatology) [facility], [facility]) Brodie Cannon, PA-C as Physician Assistant (Cardiology)  Indicate any recent Medical Services you may have received from other than [facility] providers in the past year (date may be approximate).     Assessment:    This is a routine wellness examination for Martha Walsh.  Hearing/Vision screen Hearing Screening - Comments:: Denies hearing loss Vision Screening - Comments:: Gets routine eye exams, [facility], [facility]   Goals Addressed             This Visit's Progress    Patient Stated       Exercise more, eat healthy and lose some  weight       Depression Screen     02/11/2024   10:20 AM 12/04/2023    9:38 AM 11/06/2023  4:08 PM 06/06/2023    9:26 AM 11/04/2022    9:26 AM 09/10/2022   10:32 AM 05/21/2022    2:11 PM  PHQ 2/9 Scores  PHQ - 2 Score 0 0 0 0 0 0 0  PHQ- 9 Score 0 2  0 0 0 0    Fall Risk     02/11/2024   10:24 AM 12/04/2023    9:37 AM 11/06/2023    4:08 PM 06/06/2023    9:27 AM 11/04/2022    9:26 AM  Fall Risk   Falls in the past year? 0 0 0 0 0  Number falls in past yr: 0 0 0 0 0  Injury with Fall? 0 0 0 0 0  Risk for fall due to : No Fall Risks No Fall Risks No Fall Risks No Fall Risks No Fall Risks  Follow up Falls evaluation completed Falls evaluation completed Falls evaluation completed Falls evaluation completed Falls evaluation completed    MEDICARE RISK AT HOME:  Medicare Risk at Home Any stairs in or around the home?: Yes If so, are there any without handrails?: No Home free of loose throw rugs in walkways, pet beds, electrical cords, etc?: Yes Adequate lighting in your home to reduce risk of falls?: Yes Life alert?: No Use of a cane, walker or w/c?: No Grab bars in the bathroom?: Yes Shower chair or bench in shower?: Yes Elevated toilet seat or a handicapped toilet?: Yes  TIMED UP AND GO:  Was the test performed?  No  Cognitive Function: 6CIT completed        02/11/2024   10:25 AM  6CIT Screen  What Year? 0 points  What month? 0 points  What time? 0 points  Count back from 20 0 points  Months in reverse 0 points  Repeat phrase 0 points  Total Score 0 points    Immunizations Immunization History  Administered Date(s) Administered   Influenza, High Dose Seasonal PF 06/03/2019   Influenza, Seasonal, Injecte, Preservative Fre 06/06/2010   Influenza,inj,Quad PF,6+ Mos 09/10/2016, 10/14/2017, 06/15/2018   Influenza-Unspecified 06/03/2019, 06/07/2020, 06/17/2022, 07/03/2023   PFIZER(Purple Top)SARS-COV-2 Vaccination 11/05/2019, 11/26/2019, 09/18/2020   Pneumococcal  Conjugate-13 11/22/2013   Pneumococcal Polysaccharide-23 01/16/2015   Tdap 06/06/2010, 07/29/2020   Zoster Recombinant(Shingrix ) 10/18/2021, 04/08/2022   Zoster, Live 02/07/2016    Screening Tests Health Maintenance  Topic Date Due   COVID-19 Vaccine (4 - 2024-25 season) 05/25/2023   MAMMOGRAM  03/30/2024   INFLUENZA VACCINE  04/23/2024   Medicare Annual Wellness (AWV)  02/10/2025   DEXA SCAN  07/01/2028   DTaP/Tdap/Td (3 - Td or Tdap) 07/29/2030   Colonoscopy  10/05/2033   Pneumonia Vaccine 58+ Years old  Completed   Hepatitis C Screening  Completed   Zoster Vaccines- Shingrix   Completed   HPV VACCINES  Aged Out   Meningococcal B Vaccine  Aged Out    Health Maintenance  Health Maintenance Due  Topic Date Due   COVID-19 Vaccine (4 - 2024-25 season) 05/25/2023   Health Maintenance Items Addressed: See Nurse Notes  Additional Screening:  Vision Screening: Recommended annual ophthalmology exams for early detection of glaucoma and other disorders of the eye.  Dental Screening: Recommended annual dental exams for proper oral hygiene  Community Resource Referral / Chronic Care Management: CRR required this visit?  No   CCM required this visit?  No   Plan:    I have personally reviewed and noted the following in the patient's chart:   Medical and  social history Use of alcohol, tobacco or illicit drugs  Current medications and supplements including opioid prescriptions. Patient is not currently taking opioid prescriptions. Functional ability and status Nutritional status Physical activity Advanced directives List of other physicians Hospitalizations, surgeries, and ER visits in previous 12 months Vitals Screenings to include cognitive, depression, and falls Referrals and appointments  In addition, I have reviewed and discussed with patient certain preventive protocols, quality metrics, and best practice recommendations. A written personalized care plan for  preventive services as well as general preventive health recommendations were provided to patient.   Jaunita Messier, CMA   02/11/2024   After Visit Summary: (MyChart) Due to this being a telephonic visit, the after visit summary with patients personalized plan was offered to patient via MyChart   Notes: Please refer to Routing Comments.

## 2024-02-17 ENCOUNTER — Telehealth: Payer: Self-pay

## 2024-02-17 ENCOUNTER — Other Ambulatory Visit (HOSPITAL_COMMUNITY): Payer: Self-pay

## 2024-02-17 ENCOUNTER — Encounter (HOSPITAL_COMMUNITY): Payer: Self-pay

## 2024-02-17 DIAGNOSIS — M9905 Segmental and somatic dysfunction of pelvic region: Secondary | ICD-10-CM | POA: Diagnosis not present

## 2024-02-17 DIAGNOSIS — M9903 Segmental and somatic dysfunction of lumbar region: Secondary | ICD-10-CM | POA: Diagnosis not present

## 2024-02-17 DIAGNOSIS — M4306 Spondylolysis, lumbar region: Secondary | ICD-10-CM | POA: Diagnosis not present

## 2024-02-17 DIAGNOSIS — M5416 Radiculopathy, lumbar region: Secondary | ICD-10-CM | POA: Diagnosis not present

## 2024-02-17 NOTE — Telephone Encounter (Signed)
 Pharmacy Patient Advocate Encounter  Received notification from [facility] that Prior Authorization for REPATHA  has been APPROVED from 02/17/24 to 08/19/24. Ran test claim, Copay is $302. This test claim was processed through [facility]- copay amounts may vary at other pharmacies due to pharmacy/plan contracts, or as the patient moves through the different stages of their insurance plan.

## 2024-02-17 NOTE — Telephone Encounter (Signed)
 Pharmacy Patient Advocate Encounter   Received notification from CoverMyMeds that prior authorization for REPATHA  is required/requested.   Insurance verification completed.   The patient is insured through [facility] .   Per test claim: PA required; PA submitted to above mentioned insurance via CoverMyMeds Key/confirmation #/EOC [facility] Status is pending

## 2024-02-19 ENCOUNTER — Ambulatory Visit: Payer: Self-pay | Admitting: Physician Assistant

## 2024-02-19 ENCOUNTER — Ambulatory Visit
Admission: RE | Admit: 2024-02-19 | Discharge: 2024-02-19 | Disposition: A | Discharge status: Home / Self Care | Source: Ambulatory Visit | Attending: Physician Assistant | Admitting: Physician Assistant

## 2024-02-19 DIAGNOSIS — R072 Precordial pain: Secondary | ICD-10-CM | POA: Insufficient documentation

## 2024-02-19 DIAGNOSIS — I25118 Atherosclerotic heart disease of native coronary artery with other forms of angina pectoris: Secondary | ICD-10-CM | POA: Insufficient documentation

## 2024-02-19 MED ORDER — IOHEXOL 350 MG/ML SOLN
80.0000 mL | Freq: Once | INTRAVENOUS | Status: AC | PRN
  Administered 2024-02-19: 80 mL via INTRAVENOUS

## 2024-02-19 MED ORDER — NITROGLYCERIN 0.4 MG SL SUBL
0.8000 mg | SUBLINGUAL_TABLET | Freq: Once | SUBLINGUAL | Status: AC
  Administered 2024-02-19: 0.8 mg via SUBLINGUAL
  Filled 2024-02-19: qty 25

## 2024-02-19 NOTE — Progress Notes (Signed)
Patient tolerated procedure well. W/C to lobby.  Ambulate w/o difficulty. Denies light headedness or being dizzy. Encouraged to drink extra water today and reasoning explained. Verbalized understanding. All questions answered. ABC intact. No further needs. Discharge from procedure area w/o issues.

## 2024-02-20 NOTE — Telephone Encounter (Signed)
 Attempted to contact patient.   Will forward to [facility], NP for further recommendations.

## 2024-02-20 NOTE — Telephone Encounter (Signed)
 Patient is following up. She still has concerns with $300 copay. She says she contacted her local [facility] and they gave her a similar price. She would like to discuss other options if possible.

## 2024-02-23 ENCOUNTER — Telehealth: Payer: Self-pay | Admitting: Pharmacy Technician

## 2024-02-23 ENCOUNTER — Other Ambulatory Visit (HOSPITAL_COMMUNITY): Payer: Self-pay

## 2024-02-23 NOTE — Telephone Encounter (Signed)
 Patient Advocate Encounter   The patient was approved for a [employer] grant that will help cover the cost of Repatha  Total amount awarded, 2500.00.  Effective: 01/24/24 - 01/22/25   [id number] FAO:[id number] [id number] KG:[id number] [employer] ID: [id number]   Pharmacy provided with approval and processing information. Patient informed via mychart

## 2024-03-12 DIAGNOSIS — H02831 Dermatochalasis of right upper eyelid: Secondary | ICD-10-CM | POA: Diagnosis not present

## 2024-03-12 DIAGNOSIS — H02834 Dermatochalasis of left upper eyelid: Secondary | ICD-10-CM | POA: Diagnosis not present

## 2024-03-16 DIAGNOSIS — M9903 Segmental and somatic dysfunction of lumbar region: Secondary | ICD-10-CM | POA: Diagnosis not present

## 2024-03-16 DIAGNOSIS — M5416 Radiculopathy, lumbar region: Secondary | ICD-10-CM | POA: Diagnosis not present

## 2024-03-16 DIAGNOSIS — M4306 Spondylolysis, lumbar region: Secondary | ICD-10-CM | POA: Diagnosis not present

## 2024-03-16 DIAGNOSIS — M9905 Segmental and somatic dysfunction of pelvic region: Secondary | ICD-10-CM | POA: Diagnosis not present

## 2024-03-16 DIAGNOSIS — H02834 Dermatochalasis of left upper eyelid: Secondary | ICD-10-CM | POA: Diagnosis not present

## 2024-03-19 NOTE — Progress Notes (Deleted)
 Cardiology Clinic Note   Date: 03/19/2024 ID: Martha Walsh, DOB 1951-09-05, MRN [id number]  Primary Cardiologist:  None  Chief Complaint   Martha Walsh is a 73 y.o. female who presents to the clinic today for ***  Patient Profile   Martha Walsh is followed by Dr. Barbaraann for the history outlined below.      Past medical history significant for: Nonobstructive CAD. Coronary CTA 02/19/2024: Coronary calcium  score 942 (93rd percentile).  Mild nonobstructive CAD mid LAD and RCA. Aortic insufficiency. Echo 12/27/2022: EF 60 to 65%.  No RWMA.  Normal diastolic parameters.  Normal RV size/function.  Trivial MR.  Mild to moderate AI.  Aortic valve sclerosis without stenosis. Hyperlipidemia. Lipid panel 06/06/2023: LDL 129, HDL 58, TG 136, total 211. GERD. Hypothyroidism. Anxiety.  In summary, patient was seen in the ER on 11/25/2022 for chest pain with unremarkable workup.  She establish care with Dr. Barbaraann following her ED visit and reported ongoing intermittent chest pain coming and going with and without exertion without an identifiable trigger.  Symptoms lasted several minutes and resolved on their own.  Patient reported associated exertional shortness of breath.  She underwent coronary CTA which showed a calcium  score of 956 with mild RCA stenosis and moderate LAD stenosis nonflow limiting by FFR.  She followed up in June 2020 for and reported no further episodes of angina.  She was continued on pravastatin  and Zetia  with recommendation for PCSK9i if LDL still above goal.  Patient with history of intolerance to Lipitor and Crestor.  Patient was last seen in the office by Lesley Maffucci, PA-C on 01/30/2024 for chest pain.  She reported intermittent left shoulder pain radiating into her chest and arm with associated increased shortness of breath occurring only at rest and resolving on its own.  She had not tried as needed SL NTG.  She was started on Repatha .  She underwent repeat coronary CTA  which showed calcium  score of 942 and mild nonobstructive CAD in mid LAD and RCA.     History of Present Illness    Today, patient ***  Nonobstructive CAD Coronary CTA May 2025 showed calcium  score 942 with mild nonobstructive CAD in mid LAD and RCA.  Patient*** - Continue aspirin , Repatha , Zetia , as needed SL NTG.  Aortic insufficiency Echo April 2024 showed normal LV/RV function, normal diastolic parameters, trivial MR, mild to moderate AI, aortic valve sclerosis without stenosis.  Patient*** - Repeat echo as clinically indicated.  Hyperlipidemia LDL 129 September 2024, not at goal.  Patient was started on Repatha  in*** - Continue Repatha , Zetia . - Repeat lipid panel and LFTs in 2 months***  ROS: All other systems reviewed and are otherwise negative except as noted in History of Present Illness.  EKGs/Labs Reviewed        06/06/2023: ALT 17; AST 18 01/30/2024: BUN 18; Creatinine, Ser 1.29; Potassium 4.0; Sodium 140   12/04/2023: Hemoglobin 12.1; WBC 4.6   06/06/2023: TSH 3.110   No results found for requested labs within last 365 days.  ***  Risk Assessment/Calculations    {Does this patient have ATRIAL FIBRILLATION?:[phone]} No BP recorded.  {Refresh Note OR Click here to enter BP  :1}***        Physical Exam    VS:  There were no vitals taken for this visit. , BMI There is no height or weight on file to calculate BMI.  GEN: Well nourished, well developed, in no acute distress. Neck: No JVD or carotid bruits.  Cardiac: *** RRR. *** No murmur. No rubs or gallops.   Respiratory:  Respirations regular and unlabored. Clear to auscultation without rales, wheezing or rhonchi. GI: Soft, nontender, nondistended. Extremities: Radials/DP/PT 2+ and equal bilaterally. No clubbing or cyanosis. No edema ***  Skin: Warm and dry, no rash. Neuro: Strength intact.  Assessment & Plan   ***  Disposition: ***     {Are you ordering a CV Procedure (e.g. stress test, cath, DCCV,  TEE, etc)?   Press F2        :[id number]}   Signed, Barnie HERO. Dewie Ahart, DNP, NP-C

## 2024-03-22 ENCOUNTER — Ambulatory Visit: Admitting: Student

## 2024-03-22 ENCOUNTER — Ambulatory Visit: Attending: Student | Admitting: Student

## 2024-03-22 ENCOUNTER — Encounter: Payer: Self-pay | Admitting: Student

## 2024-03-22 VITALS — BP 117/70 | HR 75 | Ht 64.0 in | Wt 195.6 lb

## 2024-03-22 DIAGNOSIS — I2510 Atherosclerotic heart disease of native coronary artery without angina pectoris: Secondary | ICD-10-CM

## 2024-03-22 DIAGNOSIS — I351 Nonrheumatic aortic (valve) insufficiency: Secondary | ICD-10-CM | POA: Diagnosis not present

## 2024-03-22 DIAGNOSIS — E785 Hyperlipidemia, unspecified: Secondary | ICD-10-CM | POA: Diagnosis not present

## 2024-03-22 NOTE — Patient Instructions (Signed)
 Medication Instructions:  Your physician recommends that you continue on your current medications as directed. Please refer to the Current Medication list given to you today.   *If you need a refill on your cardiac medications before your next appointment, please call your pharmacy*  Lab Work: None ordered at this time  If you have labs (blood work) drawn today and your tests are completely normal, you will receive your results only by: MyChart Message (if you have MyChart) OR A paper copy in the mail If you have any lab test that is abnormal or we need to change your treatment, we will call you to review the results.  Testing/Procedures: None ordered at this time   Follow-Up: At [facility], you and your health needs are our priority.  As part of our continuing mission to provide you with exceptional heart care, our providers are all part of one team.  This team includes your primary Cardiologist (physician) and Advanced Practice Providers or APPs (Physician Assistants and Nurse Practitioners) who all work together to provide you with the care you need, when you need it.  Your next appointment:   6 month(s)  Provider:   Redell Cave, MD or Timothy Gollan, MD    We recommend signing up for the patient portal called MyChart.  Sign up information is provided on this After Visit Summary.  MyChart is used to connect with patients for Virtual Visits (Telemedicine).  Patients are able to view lab/test results, encounter notes, upcoming appointments, etc.  Non-urgent messages can be sent to your provider as well.   To learn more about what you can do with MyChart, go to [email or web address].

## 2024-03-22 NOTE — Progress Notes (Signed)
 Cardiology Clinic Note   Date: 03/22/2024 ID: Martha, Walsh Sep 22, 1951, MRN [id number]  Primary Cardiologist:  Darryle Walsh Decent, MD  Chief Complaint   Martha Walsh is a 73 y.o. female who presents to the clinic today for follow up after testing.   Patient Profile   Martha Walsh is followed by Dr. Decent for the history outlined below.      Past medical history significant for: Nonobstructive CAD. Coronary CTA 02/19/2024: Coronary calcium  score 942 (93rd percentile).  Mild nonobstructive CAD mid LAD and RCA. Aortic insufficiency. Echo 12/27/2022: EF 60 to 65%.  No RWMA.  Normal diastolic parameters.  Normal RV size/function.  Trivial MR.  Mild to moderate AI.  Aortic valve sclerosis without stenosis. Hyperlipidemia. Lipid panel 06/06/2023: LDL 129, HDL 58, TG 136, total 211. GERD. Hypothyroidism. Anxiety.  In summary, patient was seen in the ER on 11/25/2022 for chest pain with unremarkable workup.  She establish care with Dr. Decent following her ED visit and reported ongoing intermittent chest pain coming and going with and without exertion without an identifiable trigger.  Symptoms lasted several minutes and resolved on their own.  Patient reported associated exertional shortness of breath.  She underwent coronary CTA which showed a calcium  score of 956 with mild RCA stenosis and moderate LAD stenosis nonflow limiting by FFR.  She followed up in June 2020 for and reported no further episodes of angina.  She was continued on pravastatin  and Zetia  with recommendation for PCSK9i if LDL still above goal.  Patient with history of intolerance to Lipitor and Crestor.  Patient was last seen in the office by Lesley Maffucci, PA-C on 01/30/2024 for chest pain.  She reported intermittent left shoulder pain radiating into her chest and arm with associated increased shortness of breath occurring only at rest and resolving on its own.  She had not tried as needed SL NTG.  She was started on Repatha .   She underwent repeat coronary CTA which showed calcium  score of 942 and mild nonobstructive CAD in mid LAD and RCA.     History of Present Illness    Today, patient is doing well. She continues to have occasional episodes of nonexertional pain across her back between her shoulders and dyspnea that lasts minutes and resolves on its own. She does not participate in routine exercise but is active around her home performing light to moderate household activities. She navigates her stairs a couple of times a day with no pain or dyspnea. No lower extremity edema, orthopnea or PND.     ROS: All other systems reviewed and are otherwise negative except as noted in History of Present Illness.  EKGs/Labs Reviewed       EKG is not ordered today.   06/06/2023: ALT 17; AST 18 01/30/2024: BUN 18; Creatinine, Ser 1.29; Potassium 4.0; Sodium 140   12/04/2023: Hemoglobin 12.1; WBC 4.6   06/06/2023: TSH 3.110   Physical Exam    VS:  BP 117/70   Pulse 75   Ht 5' 4 (1.626 m)   Wt 195 lb 9.6 oz (88.7 kg)   SpO2 92%   BMI 33.57 kg/m  , BMI Body mass index is 33.57 kg/m.  GEN: Well nourished, well developed, in no acute distress. Neck: No JVD or carotid bruits. Cardiac:  RRR. 1/6 systolic murmur. No rubs or gallops.   Respiratory:  Respirations regular and unlabored. Clear to auscultation without rales, wheezing or rhonchi. GI: Soft, nontender, nondistended. Extremities: Radials/DP/PT 2+ and  equal bilaterally. No clubbing or cyanosis. No edema.  Skin: Warm and dry, no rash. Neuro: Strength intact.  Assessment & Plan   Nonobstructive CAD Coronary CTA May 2025 showed calcium  score 942 with mild nonobstructive CAD in mid LAD and RCA.  Patient reports continued episodes of nonexertional pain across her back between her shoulders and dyspnea that lasts minutes and resolves on it own. This is unchanged from previous. She does not participate in formal exercise but is active in her home including  navigating stairs a couple of times a day without pain or dyspnea.  - Continue aspirin , Repatha , Zetia , pravastatin , as needed SL NTG.  Aortic insufficiency Echo April 2024 showed normal LV/RV function, normal diastolic parameters, trivial MR, mild to moderate AI, aortic valve sclerosis without stenosis.  Patient denies lower extremity edema, lightheadedness, dizziness, presyncope or syncope.  1/6 systolic murmur.  - Repeat echo as clinically indicated.  Hyperlipidemia LDL 129 September 2024, not at goal.  Patient was started on Repatha . Her first dose went well but she pulled the needle out too soon on her second dose and the medication ran down her leg.  - Continue Repatha , Zetia , pravastatin . - Repeat lipid panel and LFTs in 2 months. She has her annual visit with PCP and will ask her to draw labs and have results sent to the office.   Disposition: Return in 6 months or sooner as needed.          Signed, Barnie HERO. Michae Grimley, DNP, NP-C

## 2024-03-23 HISTORY — PX: BLEPHAROPLASTY: SUR158

## 2024-03-24 ENCOUNTER — Other Ambulatory Visit: Payer: Self-pay | Admitting: Internal Medicine

## 2024-03-24 DIAGNOSIS — F411 Generalized anxiety disorder: Secondary | ICD-10-CM

## 2024-03-24 DIAGNOSIS — E782 Mixed hyperlipidemia: Secondary | ICD-10-CM

## 2024-03-26 NOTE — Telephone Encounter (Signed)
 Requested Prescriptions  Pending Prescriptions Disp Refills   ezetimibe  (ZETIA ) 10 MG tablet [Pharmacy Med Name: Ezetimibe  10 MG Oral Tablet] 90 tablet 0    Sig: TAKE 1 TABLET BY MOUTH AT BEDTIME     Cardiovascular:  Antilipid - Sterol Transport Inhibitors Failed - 03/26/2024  5:11 PM      Failed - Lipid Panel in normal range within the last 12 months    Cholesterol, Total  Date Value Ref Range Status  06/06/2023 211 (H) 100 - 199 mg/dL Final   LDL Chol Calc (NIH)  Date Value Ref Range Status  06/06/2023 129 (H) 0 - 99 mg/dL Final   HDL  Date Value Ref Range Status  06/06/2023 58 >39 mg/dL Final   Triglycerides  Date Value Ref Range Status  06/06/2023 136 0 - 149 mg/dL Final         Passed - AST in normal range and within 360 days    AST  Date Value Ref Range Status  06/06/2023 18 0 - 40 IU/L Final         Passed - ALT in normal range and within 360 days    ALT  Date Value Ref Range Status  06/06/2023 17 0 - 32 IU/L Final         Passed - Patient is not pregnant      Passed - Valid encounter within last 12 months    Recent Outpatient Visits           3 months ago Generalized anxiety disorder   [place] [facility] at [facility], Leita DEL, MD   4 months ago Acute URI   [facility], Tor[place]io SQUIB[place], [place]       Future Appointments             In 2 months Justus, Leita DEL, MD [facility] at [facility]             citalopram  (CELEXA ) 10 MG tablet [Pharmacy Med Name: Citalopram  Hydrobromide 10 MG Oral Tablet] 90 tablet 0    Sig: TAKE 1 TABLET BY MOUTH AT BEDTIME     Psychiatry:  Antidepressants - SSRI Passed - 03/26/2024  5:11 PM      Passed - Valid encounter within last 6 months    Recent Outpatient Visits           3 months ago Generalized anxiety disorder   [place] [facility], Leita DEL, MD   4 months ago Acute URI   [facility] at [facility], [place], [place]       Future Appointments             In 2 months Justus, Leita DEL, MD [facility] at [facility]

## 2024-03-31 ENCOUNTER — Encounter: Payer: Self-pay | Admitting: Ophthalmology

## 2024-04-02 NOTE — Discharge Instructions (Signed)

## 2024-04-06 ENCOUNTER — Encounter: Payer: Self-pay | Admitting: Ophthalmology

## 2024-04-06 DIAGNOSIS — M4306 Spondylolysis, lumbar region: Secondary | ICD-10-CM | POA: Diagnosis not present

## 2024-04-06 DIAGNOSIS — M9903 Segmental and somatic dysfunction of lumbar region: Secondary | ICD-10-CM | POA: Diagnosis not present

## 2024-04-06 DIAGNOSIS — M5416 Radiculopathy, lumbar region: Secondary | ICD-10-CM | POA: Diagnosis not present

## 2024-04-06 DIAGNOSIS — M9905 Segmental and somatic dysfunction of pelvic region: Secondary | ICD-10-CM | POA: Diagnosis not present

## 2024-04-06 NOTE — Anesthesia Preprocedure Evaluation (Signed)
 Anesthesia Evaluation  Patient identified by MRN, date of birth, ID band Patient awake    Reviewed: Allergy & Precautions, H&P , NPO status , Patient's Chart, lab work & pertinent test results  Airway Mallampati: III  TM Distance: <3 FB Neck ROM: Full    Dental no notable dental hx. (+) Caps No caps in front teeth per patient:   Pulmonary neg pulmonary ROS, former smoker   Pulmonary exam normal breath sounds clear to auscultation       Cardiovascular + CAD  negative cardio ROS Normal cardiovascular exam Rhythm:Regular Rate:Normal  02-19-24 IMPRESSION:  1. Coronary calcium  score of 942. This was 93rd percentile for age  and sex matched control.   2. Normal coronary origin with right dominance.   3. Mild LAD and RCA stenosis (25-49%).   4. CAD-RADS 2. Mild non-obstructive CAD (25-49%). Consider  non-atherosclerotic causes of chest pain. Consider preventive  therapy and risk factor modification.   Electronically Signed:  By: Redell Cave M.D.  On: 02/19/2024 14:03   12-27-22 echo IMPRESSIONS     1. Left ventricular ejection fraction, by estimation, is 60 to 65%. The  left ventricle has normal function. The left ventricle has no regional  wall motion abnormalities. Left ventricular diastolic parameters were  normal.   2. Right ventricular systolic function is normal. The right ventricular  size is normal.   3. The mitral valve is normal in structure. Trivial mitral valve  regurgitation. No evidence of mitral stenosis.   4. The aortic valve is tricuspid. There is mild calcification of the  aortic valve. Aortic valve regurgitation is mild to moderate. Aortic valve  sclerosis is present, with no evidence of aortic valve stenosis. Aortic  regurgitation PHT measures 722 msec.   5. The inferior vena cava is normal in size with greater than 50%  respiratory variability, suggesting right atrial pressure of 3 mmHg.        Neuro/Psych  PSYCHIATRIC DISORDERS Anxiety     negative neurological ROS  negative psych ROS   GI/Hepatic negative GI ROS, Neg liver ROS,GERD  ,,  Endo/Other  negative endocrine ROSHypothyroidism    Renal/GU Renal diseasenegative Renal ROS  negative genitourinary   Musculoskeletal negative musculoskeletal ROS (+) Arthritis ,    Abdominal   Peds negative pediatric ROS (+)  Hematology negative hematology ROS (+) Blood dyscrasia, anemia   Anesthesia Other Findings Hyperlipidemia  GERD (gastroesophageal reflux disease) Hypothyroid  Arthritis Anxiety  Motion sickness Coronary artery disease  Primary osteoarthritis of right knee Motion sickness  Generalized anxiety disorder   Reproductive/Obstetrics negative OB ROS                              Anesthesia Physical Anesthesia Plan  ASA: 3  Anesthesia Plan: MAC   Post-op Pain Management:    Induction: Intravenous  PONV Risk Score and Plan:   Airway Management Planned: Natural Airway and Nasal Cannula  Additional Equipment:   Intra-op Plan:   Post-operative Plan:   Informed Consent: I have reviewed the patients History and Physical, chart, labs and discussed the procedure including the risks, benefits and alternatives for the proposed anesthesia with the patient or authorized representative who has indicated his/her understanding and acceptance.     Dental Advisory Given  Plan Discussed with: Anesthesiologist, CRNA and Surgeon  Anesthesia Plan Comments: (Patient consented for risks of anesthesia including but not limited to:  - adverse reactions to medications - damage  to eyes, teeth, lips or other oral mucosa - nerve damage due to positioning  - sore throat or hoarseness - Damage to heart, brain, nerves, lungs, other parts of body or loss of life  Patient voiced understanding and assent.)         Anesthesia Quick Evaluation

## 2024-04-09 ENCOUNTER — Ambulatory Visit: Admitting: Anesthesiology

## 2024-04-09 ENCOUNTER — Encounter
Admission: RE | Disposition: A | Discharge status: Home / Self Care | Payer: Self-pay | Source: Home / Self Care | Attending: Ophthalmology

## 2024-04-09 ENCOUNTER — Encounter: Payer: Self-pay | Admitting: Ophthalmology

## 2024-04-09 ENCOUNTER — Other Ambulatory Visit: Payer: Self-pay

## 2024-04-09 ENCOUNTER — Ambulatory Visit
Admission: RE | Admit: 2024-04-09 | Discharge: 2024-04-09 | Disposition: A | Discharge status: Home / Self Care | Attending: Ophthalmology | Admitting: Ophthalmology

## 2024-04-09 DIAGNOSIS — H02831 Dermatochalasis of right upper eyelid: Secondary | ICD-10-CM | POA: Diagnosis not present

## 2024-04-09 DIAGNOSIS — I2510 Atherosclerotic heart disease of native coronary artery without angina pectoris: Secondary | ICD-10-CM | POA: Insufficient documentation

## 2024-04-09 DIAGNOSIS — Z87891 Personal history of nicotine dependence: Secondary | ICD-10-CM | POA: Diagnosis not present

## 2024-04-09 DIAGNOSIS — H02834 Dermatochalasis of left upper eyelid: Secondary | ICD-10-CM | POA: Diagnosis not present

## 2024-04-09 DIAGNOSIS — M1990 Unspecified osteoarthritis, unspecified site: Secondary | ICD-10-CM | POA: Diagnosis not present

## 2024-04-09 DIAGNOSIS — K219 Gastro-esophageal reflux disease without esophagitis: Secondary | ICD-10-CM | POA: Insufficient documentation

## 2024-04-09 DIAGNOSIS — F419 Anxiety disorder, unspecified: Secondary | ICD-10-CM | POA: Insufficient documentation

## 2024-04-09 DIAGNOSIS — E039 Hypothyroidism, unspecified: Secondary | ICD-10-CM | POA: Insufficient documentation

## 2024-04-09 HISTORY — PX: BROW LIFT: SHX178

## 2024-04-09 HISTORY — DX: Generalized anxiety disorder: F41.1

## 2024-04-09 SURGERY — BLEPHAROPLASTY
Anesthesia: Monitor Anesthesia Care | Site: Eye | Laterality: Bilateral

## 2024-04-09 MED ORDER — ERYTHROMYCIN 5 MG/GM OP OINT: TOPICAL_OINTMENT | OPHTHALMIC | 2 refills | Status: DC

## 2024-04-09 MED ORDER — ERYTHROMYCIN 5 MG/GM OP OINT
TOPICAL_OINTMENT | OPHTHALMIC | Status: DC | PRN
  Administered 2024-04-09: 1 via OPHTHALMIC

## 2024-04-09 MED ORDER — LIDOCAINE-EPINEPHRINE 2 %-1:100000 IJ SOLN
INTRAMUSCULAR | Status: DC | PRN
  Administered 2024-04-09: 4 mL via OPHTHALMIC

## 2024-04-09 MED ORDER — LACTATED RINGERS IV SOLN: INTRAVENOUS | Status: DC

## 2024-04-09 MED ORDER — FENTANYL CITRATE (PF) 100 MCG/2ML IJ SOLN
INTRAMUSCULAR | Status: AC
  Filled 2024-04-09: qty 2

## 2024-04-09 MED ORDER — PROPOFOL 10 MG/ML IV BOLUS
INTRAVENOUS | Status: AC
  Filled 2024-04-09: qty 20

## 2024-04-09 MED ORDER — TETRACAINE HCL 0.5 % OP SOLN
OPHTHALMIC | Status: DC | PRN
  Administered 2024-04-09: 2 [drp] via OPHTHALMIC

## 2024-04-09 MED ORDER — BSS IO SOLN
INTRAOCULAR | Status: DC | PRN
  Administered 2024-04-09: 15 mL via INTRAOCULAR

## 2024-04-09 MED ORDER — FENTANYL CITRATE (PF) 100 MCG/2ML IJ SOLN
INTRAMUSCULAR | Status: DC | PRN
  Administered 2024-04-09 (×4): 25 ug via INTRAVENOUS

## 2024-04-09 MED ORDER — LIDOCAINE HCL (CARDIAC) PF 100 MG/5ML IV SOSY
PREFILLED_SYRINGE | INTRAVENOUS | Status: DC | PRN
  Administered 2024-04-09: 40 mg via INTRAVENOUS

## 2024-04-09 MED ORDER — PROPOFOL 500 MG/50ML IV EMUL
INTRAVENOUS | Status: DC | PRN
  Administered 2024-04-09: 50 ug/kg/min via INTRAVENOUS

## 2024-04-09 MED ORDER — MIDAZOLAM HCL 2 MG/2ML IJ SOLN
INTRAMUSCULAR | Status: DC | PRN
  Administered 2024-04-09: 2 mg via INTRAVENOUS

## 2024-04-09 MED ORDER — MIDAZOLAM HCL 2 MG/2ML IJ SOLN
INTRAMUSCULAR | Status: AC
  Filled 2024-04-09: qty 2

## 2024-04-09 MED ORDER — TRAMADOL HCL 50 MG PO TABS: ORAL_TABLET | ORAL | 0 refills | Status: DC

## 2024-04-09 MED ORDER — SODIUM CHLORIDE 0.9 % IV SOLN: INTRAVENOUS | Status: DC

## 2024-04-09 SURGICAL SUPPLY — 16 items
APPLICATOR COTTON TIP 3IN (MISCELLANEOUS) ×1 IMPLANT
BLADE SURG 15 STRL LF DISP TIS (BLADE) ×1 IMPLANT
CORD BIP STRL DISP 12FT (MISCELLANEOUS) ×1 IMPLANT
GAUZE SPONGE 2X2 STRL 8-PLY (GAUZE/BANDAGES/DRESSINGS) ×10 IMPLANT
GAUZE SPONGE 4X4 12PLY STRL (GAUZE/BANDAGES/DRESSINGS) ×1 IMPLANT
GLOVE SURG UNDER POLY LF SZ7 (GLOVE) ×2 IMPLANT
GOWN STRL REUS W/ TWL LRG LVL3 (GOWN DISPOSABLE) ×1 IMPLANT
MARKER SKIN XFINE TIP W/RULER (MISCELLANEOUS) ×1 IMPLANT
NDL HYPO 30X.5 LL (NEEDLE) ×2 IMPLANT
NEEDLE HYPO 30X.5 LL (NEEDLE) ×2 IMPLANT
PACK ENT CUSTOM (PACKS) ×1 IMPLANT
SOLUTION PREP PVP 2OZ (MISCELLANEOUS) ×1 IMPLANT
SUT GUT PLAIN 6-0 1X18 ABS (SUTURE) ×1 IMPLANT
SYR 10ML LL (SYRINGE) ×1 IMPLANT
SYR 3ML LL SCALE MARK (SYRINGE) ×1 IMPLANT
WATER STERILE IRR 250ML POUR (IV SOLUTION) ×1 IMPLANT

## 2024-04-09 NOTE — Anesthesia Postprocedure Evaluation (Signed)
 Anesthesia Post Note  Patient: Martha Walsh  Procedure(s) Performed: BLEPHAROPLASTY (Bilateral: Eye)  Patient location during evaluation: PACU Anesthesia Type: MAC Level of consciousness: awake and alert Pain management: pain level controlled Vital Signs Assessment: post-procedure vital signs reviewed and stable Respiratory status: spontaneous breathing, nonlabored ventilation, respiratory function stable and patient connected to nasal cannula oxygen Cardiovascular status: stable and blood pressure returned to baseline Postop Assessment: no apparent nausea or vomiting Anesthetic complications: no   No notable events documented.   Last Vitals:  Vitals:   04/09/24 1035 04/09/24 1045  BP: 116/65 135/71  Pulse: (!) 58 (!) 56  Resp: 12 11  Temp: 36.4 C   SpO2: 98% 95%    Last Pain:  Vitals:   04/09/24 1035  TempSrc:   PainSc: 0-No pain                 Ilo Beamon C Kamaiyah Uselton

## 2024-04-09 NOTE — Transfer of Care (Signed)
 Immediate Anesthesia Transfer of Care Note  Patient: Martha Walsh  Procedure(s) Performed: BLEPHAROPLASTY (Bilateral: Eye)  Patient Location: PACU  Anesthesia Type: MAC  Level of Consciousness: awake, alert  and patient cooperative  Airway and Oxygen Therapy: Patient Spontanous Breathing and Patient connected to supplemental oxygen  Post-op Assessment: Post-op Vital signs reviewed, Patient's Cardiovascular Status Stable, Respiratory Function Stable, Patent Airway and No signs of Nausea or vomiting  Post-op Vital Signs: Reviewed and stable  Complications: No notable events documented.

## 2024-04-09 NOTE — H&P (Signed)
 [facility]: [facility]  Primary Care Physician:  Justus Leita DEL, MD Ophthalmologist: Dr. Greig HERO. Ashley, M.D.  Pre-Procedure History & Physical: HPI:  Martha Walsh is a 73 y.o. female here for periocular surgery.   Past Medical History:  Diagnosis Date   Anxiety    Arthritis    Coronary artery disease    Generalized anxiety disorder    GERD (gastroesophageal reflux disease)    Hyperlipidemia    Hypothyroid    Motion sickness    Boats   Motion sickness    Primary osteoarthritis of right knee 12/01/2020   S/p TKA  2024      Past Surgical History:  Procedure Laterality Date   APPENDECTOMY     CATARACT EXTRACTION W/PHACO Left 11/18/2022   Procedure: CATARACT EXTRACTION PHACO AND INTRAOCULAR LENS PLACEMENT (IOC) LEFT  10.20  00:55.0;  Surgeon: Myrna Adine Anes, MD;  Location: [facility];  Service: Ophthalmology;  Laterality: Left;   CATARACT EXTRACTION W/PHACO Right 12/30/2022   Procedure: CATARACT EXTRACTION PHACO AND INTRAOCULAR LENS PLACEMENT (IOC) RIGHT  3.37  00:30.2;  Surgeon: Myrna Adine Anes, MD;  Location: [facility];  Service: Ophthalmology;  Laterality: Right;   CHOLECYSTECTOMY  09/2022   COLONOSCOPY     COLONOSCOPY WITH PROPOFOL  N/A 10/06/2023   Procedure: COLONOSCOPY WITH PROPOFOL ;  Surgeon: Jinny Carmine, MD;  Location: [facility];  Service: Endoscopy;  Laterality: N/A;   ESOPHAGOGASTRODUODENOSCOPY (EGD) WITH PROPOFOL  N/A 07/27/2020   Procedure: ESOPHAGOGASTRODUODENOSCOPY (EGD) WITH PROPOFOL ;  Surgeon: Jinny Carmine, MD;  Location: [facility];  Service: Endoscopy;  Laterality: N/A;   JOINT REPLACEMENT Left 02/2016   partial knee   REPLACEMENT TOTAL KNEE Right 03/2023   TONSILLECTOMY     TUBAL LIGATION      Prior to Admission medications   Medication Sig Start Date End Date Taking? Authorizing Provider  acetaminophen  (TYLENOL ) 500 MG tablet Take 2 tablets (1,000 mg total) by mouth every 6 (six) hours as needed  for mild pain. 09/26/22   Desiderio Schanz, MD  aspirin  EC 81 MG tablet Take 1 tablet (81 mg total) by mouth daily. Swallow whole. 01/30/24   Lorene Lesley CROME, PA-C  Calcium  Carbonate-Vitamin D  600-400 MG-UNIT tablet Take 1 tablet by mouth daily. 09/29/12   [provider]  Cholecalciferol 25 MCG (1000 UT) tablet Take 1,000 Units by mouth daily.    [provider]  citalopram  (CELEXA ) 10 MG tablet TAKE 1 TABLET BY MOUTH AT BEDTIME 03/26/24   Berglund, Laura H, MD  clonazePAM (KLONOPIN) 0.5 MG tablet Take 0.5 mg by mouth as needed for anxiety. 03/03/19   [provider]  cyclobenzaprine (FLEXERIL) 5 MG tablet Take 5 mg by mouth as needed. 11/03/20   [provider]  Evolocumab  (REPATHA  SURECLICK) 140 MG/ML SOAJ Inject 140 mg into the skin every 14 (fourteen) days. 01/30/24   Lorene Lesley CROME, PA-C  ezetimibe  (ZETIA ) 10 MG tablet TAKE 1 TABLET BY MOUTH AT BEDTIME 03/26/24   Justus Leita DEL, MD  ibuprofen  (ADVIL ) 200 MG tablet Take 400 mg by mouth every 6 (six) hours as needed.    [provider]  levothyroxine  (SYNTHROID ) 50 MCG tablet TAKE 1 TABLET BY MOUTH ONCE DAILY BEFORE BREAKFAST 12/24/23   Justus Leita DEL, MD  metoprolol  tartrate (LOPRESSOR ) 100 MG tablet TAKE 1 TABLET 2 HR PRIOR TO CARDIAC PROCEDURE Patient not taking: Reported on 02/11/2024 01/30/24   Lorene Lesley CROME, PA-C  nitroGLYCERIN  (NITROSTAT ) 0.4 MG SL tablet Place  1 tablet (0.4 mg total) under the tongue every 5 (five) minutes as needed for chest pain. 11/28/22   Justus Leita DEL, MD  Omega-3 Fatty Acids (FISH OIL PO) Take by mouth.    [provider]  pantoprazole  (PROTONIX ) 40 MG tablet Take 1 tablet (40 mg total) by mouth daily. 08/26/23   Jinny Carmine, MD  potassium chloride (KLOR-CON) 10 MEQ tablet Take 10 mEq by mouth at bedtime. Every other day for leg cramps OTC    [provider]  pravastatin  (PRAVACHOL ) 80 MG tablet Take 1 tablet by mouth once daily 07/08/23   Justus Leita DEL, MD     Allergies as of 03/22/2024 - Review Complete 03/22/2024  Allergen Reaction Noted   Atorvastatin  Other (See Comments) 06/06/2023   Augmentin [amoxicillin-pot clavulanate] Nausea Only 11/22/2013   Rosuvastatin Other (See Comments) 12/09/2022   Wound dressing adhesive Itching and Rash 09/18/2022   Clavulanic acid Other (See Comments) 01/14/2022   Lisinopril Cough 09/10/2016    Family History  Problem Relation Age of Onset   Aortic aneurysm Mother 78   Heart disease Father     Social History   Socioeconomic History   Marital status: Married    Spouse name: Prentice   Number of children: 3   Years of education: Not on file   Highest education level: Associate degree: academic program  Occupational History   Occupation: retired  Tobacco Use   Smoking status: Former    Current packs/day: 0.00    Types: Cigarettes    Quit date: 09/23/1998    Years since quitting: 25.5    Passive exposure: Past   Smokeless tobacco: Never   Tobacco comments:    02/11/24 Social smoker, not on a daily basis  Vaping Use   Vaping status: Never Used  Substance and Sexual Activity   Alcohol use: Yes    Alcohol/week: 5.0 standard drinks of alcohol    Types: 5 Glasses of wine per week    Comment: 1 glass of wine 5 days per week   Drug use: Never   Sexual activity: Not Currently  Other Topics Concern   Not on file  Social History Narrative   Not on file   Social Drivers of Health   Financial Resource Strain: Low Risk  (02/11/2024)   Overall Financial Resource Strain (CARDIA)    Difficulty of Paying Living Expenses: Not hard at all  Food Insecurity: No Food Insecurity (02/11/2024)   Hunger Vital Sign    Worried About Running Out of Food in the Last Year: Never true    Ran Out of Food in the Last Year: Never true  Transportation Needs: No Transportation Needs (02/11/2024)   PRAPARE - Administrator, Civil Service (Medical): No    Lack of Transportation (Non-Medical): No  Physical  Activity: Inactive (02/11/2024)   Exercise Vital Sign    Days of Exercise per Week: 0 days    Minutes of Exercise per Session: 0 min  Stress: No Stress Concern Present (02/11/2024)   [employer] of Occupational Health - Occupational Stress Questionnaire    Feeling of Stress : Not at all  Social Connections: Socially Integrated (02/11/2024)   Social Connection and Isolation Panel    Frequency of Communication with Friends and Family: More than three times a week    Frequency of Social Gatherings with Friends and Family: More than three times a week    Attends Religious Services: More than 4 times per year  Active Member of Clubs or Organizations: Yes    Attends Banker Meetings: More than 4 times per year    Marital Status: Married  Catering manager Violence: Not At Risk (02/11/2024)   Humiliation, Afraid, Rape, and Kick questionnaire    Fear of Current or Ex-Partner: No    Emotionally Abused: No    Physically Abused: No    Sexually Abused: No    Review of Systems: See HPI, otherwise negative ROS  Physical Exam: Ht 5' 4 (1.626 m)   Wt 86.2 kg   BMI 32.61 kg/m  General:   Alert and cooperative in NAD Head:  Normocephalic and atraumatic. Respiratory:  Normal work of breathing.  Impression/Plan: Joyel S Walsh is here for periocular surgery.  Risks, benefits, limitations, and alternatives regarding surgery have been reviewed with the patient.  Questions have been answered.  All parties agreeable.   Ashley Greig HERO, MD  04/09/2024, 9:10 AM

## 2024-04-09 NOTE — Interval H&P Note (Signed)
 History and Physical Interval Note:  04/09/2024 9:10 AM  Martha Walsh  has presented today for surgery, with the diagnosis of Bilateral Dermatochalasis.  The various methods of treatment have been discussed with the patient and family. After consideration of risks, benefits and other options for treatment, the patient has consented to  Procedure(s): BLEPHAROPLASTY (Bilateral) as a surgical intervention.  The patient's history has been reviewed, patient examined, no change in status, stable for surgery.  I have reviewed the patient's chart and labs.  Questions were answered to the patient's satisfaction.     Ashley, Rael Yo M

## 2024-04-09 NOTE — Op Note (Signed)
 Preoperative Diagnosis:  Visually significant dermatochalasis both  Upper Eyelid(s)  Postoperative Diagnosis:  Same.  Procedure(s) Performed:   Upper eyelid blepharoplasty with excess skin excision  bilateral  Upper Eyelid(s)  Surgeon: Greig HERO. Ashley, M.D.  Assistants: none  Anesthesia: MAC  Specimens: None.  Estimated Blood Loss: Minimal.  Complications: None.  Operative Findings: None Dictated  Procedure:   Allergies were reviewed and the patient is allergic to Atorvastatin , Augmentin [amoxicillin-pot clavulanate], Rosuvastatin, Wound dressing adhesive, Clavulanic acid, and Lisinopril.   After the risks, benefits, complications and alternatives were discussed with the patient, appropriate informed consent was obtained and the patient was brought to the operating suite. The patient was reclined supine and a timeout was conducted.  The patient was then sedated.  Local anesthetic consisting of a 50-50 mixture of 2% lidocaine  with epinephrine  and 0.75% bupivacaine  with added Hylenex was injected subcutaneously to both  upper eyelid(s). After adequate local was instilled, the patient was prepped and draped in the usual sterile fashion for eyelid surgery.   Attention was turned to the upper eyelids. A 9mm upper eyelid crease incision line was marked with calipers on both  upper eyelid(s).  A pinch test was used to estimate the amount of excess skin to remove and this was marked in standard blepharoplasty style fashion. Attention was turned to the  right  upper eyelid. A #15 blade was used to open the premarked incision line. A Skin and muscle flap was excised and hemostasis was obtained with bipolar cautery. A buttonhole was created medially in orbicularis and orbital septum to reveal the medial fat pocket. This was dissected free from fascial attachments, cauterized towards the pedicle base and excised to produce a nice flattening of the medial corner of the upper eyelid. A strip of ROOF  was excised to help debulk the lateral upper eyelid.  Attention was then turned to the opposite eyelid where the same procedure was performed in the same manner. Hemostasis was obtained with bipolar cautery throughout. All incisions were then closed with a combination of running and interrupted 6-0 fast absorbing plain suture. The patient tolerated the procedure well.  Erythromycin ophthalmic ointment was applied to her incision sites, followed by ice packs. She was taken to the recovery area where she recovered without difficulty.  Post-Op Plan/Instructions:  The patient was instructed to use ice packs frequently for the next 48 hours. She was instructed to use Erythromycin ophthalmic ointment on her incisions 4 times a day for the next 12 to 14 days. She was given a prescription for tramadol (or similar) for pain control should Tylenol  not be effective. She was asked to to follow up in 2-3 weeks' time at the [facility] in [place], [place] or sooner as needed for problems.  Randi Poullard M. Ashley, M.D. Ophthalmology

## 2024-04-19 DIAGNOSIS — L821 Other seborrheic keratosis: Secondary | ICD-10-CM | POA: Diagnosis not present

## 2024-04-19 DIAGNOSIS — Z85828 Personal history of other malignant neoplasm of skin: Secondary | ICD-10-CM | POA: Diagnosis not present

## 2024-04-19 DIAGNOSIS — D229 Melanocytic nevi, unspecified: Secondary | ICD-10-CM | POA: Diagnosis not present

## 2024-04-19 DIAGNOSIS — L814 Other melanin hyperpigmentation: Secondary | ICD-10-CM | POA: Diagnosis not present

## 2024-04-26 ENCOUNTER — Ambulatory Visit
Admission: RE | Admit: 2024-04-26 | Discharge: 2024-04-26 | Disposition: A | Discharge status: Home / Self Care | Source: Ambulatory Visit | Attending: Internal Medicine | Admitting: Internal Medicine

## 2024-04-26 DIAGNOSIS — Z1231 Encounter for screening mammogram for malignant neoplasm of breast: Secondary | ICD-10-CM | POA: Diagnosis not present

## 2024-05-04 DIAGNOSIS — M9905 Segmental and somatic dysfunction of pelvic region: Secondary | ICD-10-CM | POA: Diagnosis not present

## 2024-05-04 DIAGNOSIS — M5416 Radiculopathy, lumbar region: Secondary | ICD-10-CM | POA: Diagnosis not present

## 2024-05-04 DIAGNOSIS — M9903 Segmental and somatic dysfunction of lumbar region: Secondary | ICD-10-CM | POA: Diagnosis not present

## 2024-05-04 DIAGNOSIS — M4306 Spondylolysis, lumbar region: Secondary | ICD-10-CM | POA: Diagnosis not present

## 2024-05-10 ENCOUNTER — Other Ambulatory Visit: Payer: Self-pay | Admitting: Internal Medicine

## 2024-05-12 NOTE — Telephone Encounter (Signed)
 Requested Prescriptions  Pending Prescriptions Disp Refills   levothyroxine  (SYNTHROID ) 50 MCG tablet [Pharmacy Med Name: Levothyroxine  Sodium 50 MCG Oral Tablet] 90 tablet 0    Sig: TAKE 1 TABLET BY MOUTH ONCE DAILY BEFORE BREAKFAST     Endocrinology:  Hypothyroid Agents Passed - 05/12/2024  8:36 AM      Passed - TSH in normal range and within 360 days    TSH  Date Value Ref Range Status  06/06/2023 3.110 0.450 - 4.500 uIU/mL Final         Passed - Valid encounter within last 12 months    Recent Outpatient Visits           5 months ago Generalized anxiety disorder   [place] [facility] at [facility], Leita DEL, MD   6 months ago Acute URI   [facility] at [facility], Toribio SQUIBB, [place]       Future Appointments             In 3 weeks Justus, Leita DEL, MD [facility] at [facility], [facility]

## 2024-06-07 ENCOUNTER — Ambulatory Visit (INDEPENDENT_AMBULATORY_CARE_PROVIDER_SITE_OTHER): Admitting: Internal Medicine

## 2024-06-07 ENCOUNTER — Encounter: Payer: Self-pay | Admitting: Internal Medicine

## 2024-06-07 VITALS — BP 110/60 | HR 87 | Ht 64.0 in | Wt 193.0 lb

## 2024-06-07 DIAGNOSIS — D631 Anemia in chronic kidney disease: Secondary | ICD-10-CM | POA: Diagnosis not present

## 2024-06-07 DIAGNOSIS — E782 Mixed hyperlipidemia: Secondary | ICD-10-CM | POA: Diagnosis not present

## 2024-06-07 DIAGNOSIS — K219 Gastro-esophageal reflux disease without esophagitis: Secondary | ICD-10-CM

## 2024-06-07 DIAGNOSIS — N1831 Chronic kidney disease, stage 3a: Secondary | ICD-10-CM | POA: Diagnosis not present

## 2024-06-07 DIAGNOSIS — E039 Hypothyroidism, unspecified: Secondary | ICD-10-CM

## 2024-06-07 DIAGNOSIS — Z0000 Encounter for general adult medical examination without abnormal findings: Secondary | ICD-10-CM

## 2024-06-07 DIAGNOSIS — I351 Nonrheumatic aortic (valve) insufficiency: Secondary | ICD-10-CM | POA: Insufficient documentation

## 2024-06-07 DIAGNOSIS — Z23 Encounter for immunization: Secondary | ICD-10-CM

## 2024-06-07 DIAGNOSIS — I2510 Atherosclerotic heart disease of native coronary artery without angina pectoris: Secondary | ICD-10-CM | POA: Insufficient documentation

## 2024-06-07 DIAGNOSIS — F411 Generalized anxiety disorder: Secondary | ICD-10-CM

## 2024-06-07 MED ORDER — CITALOPRAM HYDROBROMIDE 10 MG PO TABS: 10.0000 mg | ORAL_TABLET | Freq: Every day | ORAL | 1 refills | Status: AC

## 2024-06-07 MED ORDER — LEVOTHYROXINE SODIUM 50 MCG PO TABS: 50.0000 ug | ORAL_TABLET | Freq: Every day | ORAL | 1 refills | Status: AC

## 2024-06-07 MED ORDER — PRAVASTATIN SODIUM 80 MG PO TABS: 80.0000 mg | ORAL_TABLET | Freq: Every day | ORAL | 1 refills | Status: AC

## 2024-06-07 MED ORDER — EZETIMIBE 10 MG PO TABS: 10.0000 mg | ORAL_TABLET | Freq: Every day | ORAL | 1 refills | Status: AC

## 2024-06-07 NOTE — Assessment & Plan Note (Signed)
 Reflux symptoms are controlled on pantoprazole. Patient denies red flag symptoms - no melena, weight loss, dysphagia.

## 2024-06-07 NOTE — Assessment & Plan Note (Signed)
 Being followed by Cardiology. Recent ECHO done -

## 2024-06-07 NOTE — Assessment & Plan Note (Signed)
 Supplemented. Lab Results  Component Value Date   TSH 3.110 06/06/2023

## 2024-06-07 NOTE — Progress Notes (Signed)
 Date:  06/07/2024   Name:  Martha Walsh   DOB:  01/21/1951   MRN:  [id number]   Chief Complaint: Annual Exam Martha Walsh is a 73 y.o. female who presents today for her Complete Annual Exam. She feels well. She reports exercising none. She reports she is sleeping well. Breast complaints none.  Health Maintenance  Topic Date Due   COVID-19 Vaccine (4 - 2025-26 season) 06/23/2024*   Medicare Annual Wellness Visit  02/10/2025   Breast Cancer Screening  04/26/2025   DEXA scan (bone density measurement)  07/01/2028   DTaP/Tdap/Td vaccine (3 - Td or Tdap) 07/29/2030   Colon Cancer Screening  10/05/2033   Pneumococcal Vaccine for age over 16  Completed   Flu Shot  Completed   Hepatitis C Screening  Completed   Zoster (Shingles) Vaccine  Completed   HPV Vaccine  Aged Out   Meningitis B Vaccine  Aged Out  *Topic was postponed. The date shown is not the original due date.   Heart Problem This is a chronic problem. The problem has been unchanged. Pertinent negatives include no abdominal pain, arthralgias, chest pain, coughing, fatigue, headaches, myalgias or weakness.  Hyperlipidemia This is a chronic problem. Pertinent negatives include no chest pain, myalgias or shortness of breath. Current antihyperlipidemic treatment includes statins and ezetimibe  (and Repatha ).  Depression        This is a chronic problem.The problem is unchanged.  Associated symptoms include no fatigue, no myalgias and no headaches.  Past treatments include SSRIs - Selective serotonin reuptake inhibitors.  Past medical history includes thyroid  problem.   Thyroid  Problem Presents for follow-up visit. Patient reports no constipation, diarrhea, fatigue or palpitations. The symptoms have been stable. Her past medical history is significant for hyperlipidemia.  Gastroesophageal Reflux She complains of heartburn. She reports no abdominal pain, no chest pain, no coughing or no wheezing. This is a recurrent problem. The  problem occurs rarely. Pertinent negatives include no fatigue. She has tried a PPI for the symptoms.    Review of Systems  Constitutional:  Negative for fatigue and unexpected weight change.  HENT:  Negative for trouble swallowing.   Eyes:  Negative for visual disturbance.  Respiratory:  Negative for cough, chest tightness, shortness of breath and wheezing.   Cardiovascular:  Negative for chest pain, palpitations and leg swelling.  Gastrointestinal:  Positive for heartburn. Negative for abdominal pain, constipation and diarrhea.  Musculoskeletal:  Negative for arthralgias and myalgias.  Neurological:  Negative for dizziness, weakness, light-headedness and headaches.  Psychiatric/Behavioral:  Positive for depression.      Lab Results  Component Value Date   NA 140 01/30/2024   K 4.0 01/30/2024   CO2 20 01/30/2024   GLUCOSE 88 01/30/2024   BUN 18 01/30/2024   CREATININE 1.29 (H) 01/30/2024   CALCIUM  9.1 01/30/2024   EGFR 43 (L) 01/30/2024   GFRNONAA >60 11/25/2022   Lab Results  Component Value Date   CHOL 211 (H) 06/06/2023   HDL 58 06/06/2023   LDLCALC 129 (H) 06/06/2023   TRIG 136 06/06/2023   CHOLHDL 3.6 06/06/2023   Lab Results  Component Value Date   TSH 3.110 06/06/2023   No results found for: HGBA1C Lab Results  Component Value Date   WBC 4.6 12/04/2023   HGB 12.1 12/04/2023   HCT 37.1 12/04/2023   MCV 85 12/04/2023   PLT 212 12/04/2023   Lab Results  Component Value Date   ALT 17 06/06/2023  AST 18 06/06/2023   ALKPHOS 80 06/06/2023   BILITOT <0.2 06/06/2023   Lab Results  Component Value Date   VD25OH 47.5 06/06/2023     Patient Active Problem List   Diagnosis Date Noted   Nonobstructive atherosclerosis of coronary artery 06/07/2024   Nonrheumatic aortic (valve) insufficiency 06/07/2024   Change in bowel habits 10/06/2023   Osteopenia determined by x-ray 06/06/2023   Anemia due to stage 3a chronic kidney disease (HCC) 11/04/2022   Low back  pain 10/05/2021   History of nonmelanoma skin cancer 12/04/2020   Generalized anxiety disorder 06/04/2019   Status post left partial knee replacement 03/12/2016   Acquired hypothyroidism 02/07/2016   Atypical mole 11/22/2015   Gastro-esophageal reflux disease without esophagitis 08/04/2015   Nocturnal leg cramps 11/22/2013   Obesity (BMI 30-39.9) 11/22/2013   Mixed hyperlipidemia 09/28/2013    Allergies  Allergen Reactions   Atorvastatin  Other (See Comments)   Augmentin [Amoxicillin-Pot Clavulanate] Nausea Only    Abdominal Pain    Rosuvastatin Other (See Comments)    Severe Neuropathy   Wound Dressing Adhesive Itching and Rash    Beet red at dressing site after knee replacement   Clavulanic Acid Other (See Comments)    Abdominal pain   Lisinopril Cough         Past Surgical History:  Procedure Laterality Date   APPENDECTOMY     BLEPHAROPLASTY Bilateral 03/2024   BROW LIFT Bilateral 04/09/2024   Procedure: BLEPHAROPLASTY;  Surgeon: Ashley Greig HERO, MD;  Location[facility];  Service: Ophthalmology;  Laterality: Bilateral;   CATARACT EXTRACTION W/PHACO Left 11/18/2022   Procedure: CATARACT EXTRACTION PHACO AND INTRAOCULAR LENS PLACEMENT (IOC) LEFT  10.20  00:55.0;  Surgeon: Myrna Adine Anes, MD;  Location: [facility];  Service: Ophthalmology;  Laterality: Left;   CATARACT EXTRACTION W/PHACO Right 12/30/2022   Procedure: CATARACT EXTRACTION PHACO AND INTRAOCULAR LENS PLACEMENT (IOC) RIGHT  3.37  00:30.2;  Surgeon: Myrna Adine Anes, MD;  Location: [facility];  Service: Ophthalmology;  Laterality: Right;   CHOLECYSTECTOMY  09/2022   COLONOSCOPY     COLONOSCOPY WITH PROPOFOL  N/A 10/06/2023   Procedure: COLONOSCOPY WITH PROPOFOL ;  Surgeon: Jinny Carmine, MD;  Location: [facility];  Service: Endoscopy;  Laterality: N/A;   ESOPHAGOGASTRODUODENOSCOPY (EGD) WITH PROPOFOL  N/A 07/27/2020   Procedure: ESOPHAGOGASTRODUODENOSCOPY (EGD) WITH  PROPOFOL ;  Surgeon: Jinny Carmine, MD;  Location: [facility];  Service: Endoscopy;  Laterality: N/A;   JOINT REPLACEMENT Left 02/2016   partial knee   REPLACEMENT TOTAL KNEE Right 03/2023   TONSILLECTOMY     TUBAL LIGATION      Social History   Tobacco Use   Smoking status: Former    Current packs/day: 0.00    Types: Cigarettes    Quit date: 09/23/1998    Years since quitting: 25.7    Passive exposure: Past   Smokeless tobacco: Never   Tobacco comments:    02/11/24 Social smoker, not on a daily basis  Vaping Use   Vaping status: Never Used  Substance Use Topics   Alcohol use: Yes    Alcohol/week: 5.0 standard drinks of alcohol    Types: 5 Glasses of wine per week    Comment: 1 glass of wine 5 days per week   Drug use: Never     Medication list has been reviewed and updated.  Current Meds  Medication Sig   acetaminophen  (TYLENOL ) 500 MG tablet Take 2 tablets (1,000 mg total) by mouth every 6 (  six) hours as needed for mild pain.   aspirin  EC 81 MG tablet Take 1 tablet (81 mg total) by mouth daily. Swallow whole.   Calcium  Carbonate-Vitamin D  600-400 MG-UNIT tablet Take 1 tablet by mouth daily.   Cholecalciferol 25 MCG (1000 UT) tablet Take 1,000 Units by mouth daily.   Evolocumab  (REPATHA  SURECLICK) 140 MG/ML SOAJ Inject 140 mg into the skin every 14 (fourteen) days.   ibuprofen  (ADVIL ) 200 MG tablet Take 400 mg by mouth every 6 (six) hours as needed.   nitroGLYCERIN  (NITROSTAT ) 0.4 MG SL tablet Place 1 tablet (0.4 mg total) under the tongue every 5 (five) minutes as needed for chest pain.   Omega-3 Fatty Acids (FISH OIL PO) Take by mouth.   pantoprazole  (PROTONIX ) 40 MG tablet Take 1 tablet (40 mg total) by mouth daily.   potassium chloride (KLOR-CON) 10 MEQ tablet Take 10 mEq by mouth at bedtime. Every other day for leg cramps OTC   [DISCONTINUED] citalopram  (CELEXA ) 10 MG tablet TAKE 1 TABLET BY MOUTH AT BEDTIME   [DISCONTINUED] clonazePAM (KLONOPIN) 0.5 MG tablet  Take 0.5 mg by mouth as needed for anxiety.   [DISCONTINUED] cyclobenzaprine (FLEXERIL) 5 MG tablet Take 5 mg by mouth as needed.   [DISCONTINUED] ezetimibe  (ZETIA ) 10 MG tablet TAKE 1 TABLET BY MOUTH AT BEDTIME   [DISCONTINUED] levothyroxine  (SYNTHROID ) 50 MCG tablet TAKE 1 TABLET BY MOUTH ONCE DAILY BEFORE BREAKFAST   [DISCONTINUED] pravastatin  (PRAVACHOL ) 80 MG tablet Take 1 tablet by mouth once daily       06/07/2024    9:29 AM 12/04/2023    9:38 AM 11/06/2023    4:08 PM 06/06/2023    9:27 AM  GAD 7 : Generalized Anxiety Score  Nervous, Anxious, on Edge 0 0 0 0  Control/stop worrying 0 0 0 0  Worry too much - different things 0 0 0 0  Trouble relaxing 0 0 0 0  Restless 0 0 0 0  Easily annoyed or irritable 0 0 0 0  Afraid - awful might happen 0 0 0 0  Total GAD 7 Score 0 0 0 0  Anxiety Difficulty Not difficult at all  Not difficult at all Not difficult at all       06/07/2024    9:29 AM 02/11/2024   10:20 AM 12/04/2023    9:38 AM  Depression screen PHQ 2/9  Decreased Interest 0 0 0  Down, Depressed, Hopeless 0 0 0  PHQ - 2 Score 0 0 0  Altered sleeping 0 0 0  Tired, decreased energy 0 0 1  Change in appetite 0 0 1  Feeling bad or failure about yourself  0 0 0  Trouble concentrating 0 0 0  Moving slowly or fidgety/restless 0 0 0  Suicidal thoughts 0 0 0  PHQ-9 Score 0 0 2  Difficult doing work/chores  Not difficult at all     BP Readings from Last 3 Encounters:  06/07/24 110/60  04/09/24 128/83  03/22/24 117/70    Physical Exam Vitals and nursing note reviewed.  Constitutional:      General: She is not in acute distress.    Appearance: She is well-developed.  HENT:     Head: Normocephalic and atraumatic.     Right Ear: Tympanic membrane and ear canal normal.     Left Ear: Tympanic membrane and ear canal normal.     Nose:     Right Sinus: No maxillary sinus tenderness.     Left Sinus:  No maxillary sinus tenderness.  Eyes:     General: No scleral icterus.        Right eye: No discharge.        Left eye: No discharge.     Conjunctiva/sclera: Conjunctivae normal.  Neck:     Thyroid : No thyromegaly.     Vascular: No carotid bruit.  Cardiovascular:     Rate and Rhythm: Normal rate and regular rhythm.     Pulses: Normal pulses.     Heart sounds: No murmur heard. Pulmonary:     Effort: Pulmonary effort is normal. No respiratory distress.     Breath sounds: No wheezing.  Abdominal:     General: Bowel sounds are normal.     Palpations: Abdomen is soft.     Tenderness: There is no abdominal tenderness.  Musculoskeletal:     Cervical back: Normal range of motion. No erythema.     Right lower leg: No edema.     Left lower leg: No edema.  Lymphadenopathy:     Cervical: No cervical adenopathy.  Skin:    General: Skin is warm and dry.     Findings: No rash.  Neurological:     Mental Status: She is alert and oriented to person, place, and time.     Cranial Nerves: No cranial nerve deficit.     Sensory: No sensory deficit.     Deep Tendon Reflexes: Reflexes are normal and symmetric.  Psychiatric:        Attention and Perception: Attention normal.        Mood and Affect: Mood normal.     Wt Readings from Last 3 Encounters:  06/07/24 193 lb (87.5 kg)  04/09/24 194 lb 6.4 oz (88.2 kg)  03/22/24 195 lb 9.6 oz (88.7 kg)    BP 110/60   Pulse 87   Ht 5' 4 (1.626 m)   Wt 193 lb (87.5 kg)   SpO2 97%   BMI 33.13 kg/m   Assessment and Plan:  Problem List Items Addressed This Visit       Unprioritized   Acquired hypothyroidism (Chronic)   Supplemented. Lab Results  Component Value Date   TSH 3.110 06/06/2023         Relevant Medications   levothyroxine  (SYNTHROID ) 50 MCG tablet   Other Relevant Orders   TSH + free T4   Generalized anxiety disorder (Chronic)   Clinically stable on Celexa .   No SI or HI on evaluation. Plan to continue same medications for now.       Relevant Medications   citalopram  (CELEXA ) 10 MG tablet    Gastro-esophageal reflux disease without esophagitis (Chronic)   Reflux symptoms are controlled on pantoprazole . Patient denies red flag symptoms - no melena, weight loss, dysphagia.       Relevant Orders   CBC with Differential/Platelet   Mixed hyperlipidemia (Chronic)   Now on three medications. Lab Results  Component Value Date   LDLCALC 129 (H) 06/06/2023         Relevant Medications   ezetimibe  (ZETIA ) 10 MG tablet   pravastatin  (PRAVACHOL ) 80 MG tablet   Other Relevant Orders   Lipid panel   Anemia due to stage 3a chronic kidney disease (HCC)   Continuing to monitor regularly. Avoiding nephrotoxic agents      Relevant Orders   CBC with Differential/Platelet   Comprehensive metabolic panel with GFR   Urinalysis, Routine w reflex microscopic   Nonobstructive atherosclerosis of coronary artery   Being followed  by Cardiology. Now on Repatha , statin and Zetia       Relevant Medications   ezetimibe  (ZETIA ) 10 MG tablet   pravastatin  (PRAVACHOL ) 80 MG tablet   Nonrheumatic aortic (valve) insufficiency   Being followed by Cardiology. Recent ECHO done -       Relevant Medications   ezetimibe  (ZETIA ) 10 MG tablet   pravastatin  (PRAVACHOL ) 80 MG tablet   Other Visit Diagnoses       Annual physical exam    -  Primary   up to date on screening and immunizations     Encounter for immunization       Relevant Orders   Flu vaccine HIGH DOSE PF(Fluzone Trivalent) (Completed)       Return in about 6 months (around 12/05/2024) for TOC Thyroid , anxiety  Dr. Lemon.    Martha HILARIO Adie, MD [facility] [place]

## 2024-06-07 NOTE — Assessment & Plan Note (Signed)
 Continuing to monitor regularly. Avoiding nephrotoxic agents

## 2024-06-07 NOTE — Assessment & Plan Note (Signed)
 Being followed by Cardiology. Now on Repatha , statin and Zetia 

## 2024-06-07 NOTE — Assessment & Plan Note (Signed)
 Now on three medications. Lab Results  Component Value Date   LDLCALC 129 (H) 06/06/2023

## 2024-06-07 NOTE — Assessment & Plan Note (Signed)
 Clinically stable on Celexa.   No SI or HI on evaluation. Plan to continue same medications for now.

## 2024-06-08 ENCOUNTER — Ambulatory Visit: Payer: Self-pay | Admitting: Internal Medicine

## 2024-06-08 DIAGNOSIS — M9903 Segmental and somatic dysfunction of lumbar region: Secondary | ICD-10-CM | POA: Diagnosis not present

## 2024-06-08 DIAGNOSIS — M4306 Spondylolysis, lumbar region: Secondary | ICD-10-CM | POA: Diagnosis not present

## 2024-06-08 DIAGNOSIS — M9905 Segmental and somatic dysfunction of pelvic region: Secondary | ICD-10-CM | POA: Diagnosis not present

## 2024-06-08 DIAGNOSIS — M5416 Radiculopathy, lumbar region: Secondary | ICD-10-CM | POA: Diagnosis not present

## 2024-06-08 LAB — CBC WITH DIFFERENTIAL/PLATELET
Basophils Absolute: 0 x10E3/uL (ref 0.0–0.2)
Basos: 1 %
EOS (ABSOLUTE): 0.2 x10E3/uL (ref 0.0–0.4)
Eos: 3 %
Hematocrit: 38.4 % (ref 34.0–46.6)
Hemoglobin: 12.5 g/dL (ref 11.1–15.9)
Immature Grans (Abs): 0 x10E3/uL (ref 0.0–0.1)
Immature Granulocytes: 0 %
Lymphocytes Absolute: 1.4 x10E3/uL (ref 0.7–3.1)
Lymphs: 30 %
MCH: 28.3 pg (ref 26.6–33.0)
MCHC: 32.6 g/dL (ref 31.5–35.7)
MCV: 87 fL (ref 79–97)
Monocytes Absolute: 0.4 x10E3/uL (ref 0.1–0.9)
Monocytes: 9 %
Neutrophils Absolute: 2.6 x10E3/uL (ref 1.4–7.0)
Neutrophils: 57 %
Platelets: 207 x10E3/uL (ref 150–450)
RBC: 4.41 x10E6/uL (ref 3.77–5.28)
RDW: 13.4 % (ref 11.7–15.4)
WBC: 4.6 x10E3/uL (ref 3.4–10.8)

## 2024-06-08 LAB — COMPREHENSIVE METABOLIC PANEL WITH GFR
ALT: 17 IU/L (ref 0–32)
AST: 19 IU/L (ref 0–40)
Albumin: 4.3 g/dL (ref 3.8–4.8)
Alkaline Phosphatase: 80 IU/L (ref 51–125)
BUN/Creatinine Ratio: 18 (ref 12–28)
BUN: 21 mg/dL (ref 8–27)
Bilirubin Total: 0.6 mg/dL (ref 0.0–1.2)
CO2: 22 mmol/L (ref 20–29)
Calcium: 9.5 mg/dL (ref 8.7–10.3)
Chloride: 101 mmol/L (ref 96–106)
Creatinine, Ser: 1.17 mg/dL — ABNORMAL HIGH (ref 0.57–1.00)
Globulin, Total: 2.1 g/dL (ref 1.5–4.5)
Glucose: 94 mg/dL (ref 70–99)
Potassium: 4.3 mmol/L (ref 3.5–5.2)
Sodium: 139 mmol/L (ref 134–144)
Total Protein: 6.4 g/dL (ref 6.0–8.5)
eGFR: 49 mL/min/1.73 — ABNORMAL LOW (ref >59)

## 2024-06-08 LAB — URINALYSIS, ROUTINE W REFLEX MICROSCOPIC
Bilirubin, UA: NEGATIVE
Glucose, UA: NEGATIVE
Ketones, UA: NEGATIVE
Nitrite, UA: NEGATIVE
Protein,UA: NEGATIVE
Specific Gravity, UA: 1.025 (ref 1.005–1.030)
Urobilinogen, Ur: 0.2 mg/dL (ref 0.2–1.0)
pH, UA: 5.5 (ref 5.0–7.5)

## 2024-06-08 LAB — LIPID PANEL
Chol/HDL Ratio: 1.9 ratio (ref 0.0–4.4)
Cholesterol, Total: 116 mg/dL (ref 100–199)
HDL: 62 mg/dL (ref >39)
LDL Chol Calc (NIH): 33 mg/dL (ref 0–99)
Triglycerides: 120 mg/dL (ref 0–149)
VLDL Cholesterol Cal: 21 mg/dL (ref 5–40)

## 2024-06-08 LAB — TSH+FREE T4
Free T4: 1.39 ng/dL (ref 0.82–1.77)
TSH: 3.27 u[IU]/mL (ref 0.450–4.500)

## 2024-06-08 LAB — MICROSCOPIC EXAMINATION
Casts: NONE SEEN /LPF
Epithelial Cells (non renal): >10 /HPF — AB (ref 0–10)

## 2024-06-08 NOTE — Telephone Encounter (Signed)
 Please review.  KP

## 2024-06-09 DIAGNOSIS — H524 Presbyopia: Secondary | ICD-10-CM | POA: Diagnosis not present

## 2024-10-02 ENCOUNTER — Other Ambulatory Visit: Payer: Self-pay | Admitting: Gastroenterology

## 2024-10-08 NOTE — Progress Notes (Unsigned)
 Cardiology Office Note  Date:  10/11/2024   ID:  Martha Walsh, DOB 1951-05-07, MRN [id number]  PCP:  Justus Leita DEL, MD   Chief Complaint  Patient presents with   6 month follow up     Patient denies any cardiac concerns or issues. Patient is going to [place]  and needs to cancel the trip, the insurance company is causing a problem getting her money back and is asking for a doctors note due to the anxiety this is causing her.     HPI:  Martha Walsh is a 74 y.o. female with past medical history of: Remote smoker Nonobstructive CAD.  By cardiac CTA in 2024 in 2025 --Coronary CTA 02/19/2024: Coronary calcium  score 942 (93rd percentile).  Mild nonobstructive CAD mid LAD and RCA. Aortic insufficiency. --Echo 12/27/2022: EF 60 to 65%.  No RWMA.  Normal diastolic parameters.  Normal RV size/function.  Trivial MR.  Mild to moderate AI.  Aortic valve sclerosis without stenosis. Hyperlipidemia. GERD. Hypothyroidism. Anxiety. Who presents for follow-up in the [facility] office for coronary artery disease, chest pain  Previously followed in [facility]  ER on 11/25/2022 for chest pain with unremarkable workup.   ongoing intermittent chest pain coming and going with and without exertion without an identifiable trigger.  Symptoms lasted several minutes and resolved on their own.   coronary CTA 3/24 which showed a calcium  score of 956 with mild RCA stenosis and moderate LAD stenosis nonflow limiting by FFR.    On pravastatin  and Zetia    intolerance to Lipitor and Crestor.   seen in the office 01/30/2024 for chest pain.   started on Repatha .    repeat coronary CTA 5/25  which showed calcium  score of 942 and mild nonobstructive CAD in mid LAD and RCA.   Has NTG, does not use it  Pains settled down Maybe anxiety  Total chol 116, LDL 33 On repatha   Family hx: mother with AAA  EKG personally reviewed by myself on todays visit EKG Interpretation Date/Time:  Monday October 11 2024  11:16:01 EST Ventricular Rate:  65 PR Interval:  186 QRS Duration:  78 QT Interval:  402 QTC Calculation: 418 R Axis:   0  Text Interpretation: Normal sinus rhythm Normal ECG When compared with ECG of 20-Mar-2023 14:19, No significant change was found Confirmed by Perla Lye [phone]) on 10/11/2024 11:39:08 AM    PMH:   has a past medical history of Anxiety, Arthritis, Coronary artery disease, Generalized anxiety disorder, GERD (gastroesophageal reflux disease), Hyperlipidemia, Hypothyroid, Motion sickness, Motion sickness, and Primary osteoarthritis of right knee (12/01/2020).   PSH:    Past Surgical History:  Procedure Laterality Date   APPENDECTOMY     BLEPHAROPLASTY Bilateral 03/2024   BROW LIFT Bilateral 04/09/2024   Procedure: BLEPHAROPLASTY;  Surgeon: Ashley Greig HERO, MD;  Location: [facility];  Service: Ophthalmology;  Laterality: Bilateral;   CATARACT EXTRACTION W/PHACO Left 11/18/2022   Procedure: CATARACT EXTRACTION PHACO AND INTRAOCULAR LENS PLACEMENT (IOC) LEFT  10.20  00:55.0;  Surgeon: Myrna Adine Anes, MD;  Location: [facility];  Service: Ophthalmology;  Laterality: Left;   CATARACT EXTRACTION W/PHACO Right 12/30/2022   Procedure: CATARACT EXTRACTION PHACO AND INTRAOCULAR LENS PLACEMENT (IOC) RIGHT  3.37  00:30.2;  Surgeon: Myrna Adine Anes, MD;  [facility];  Service: Ophthalmology;  Laterality: Right;   CHOLECYSTECTOMY  09/2022   COLONOSCOPY     COLONOSCOPY WITH PROPOFOL  N/A 10/06/2023   Procedure: COLONOSCOPY WITH PROPOFOL ;  Surgeon: Jinny Carmine, MD;  Location: [facility];  Service: Endoscopy;  Laterality: N/A;   ESOPHAGOGASTRODUODENOSCOPY (EGD) WITH PROPOFOL  N/A 07/27/2020   Procedure: ESOPHAGOGASTRODUODENOSCOPY (EGD) WITH PROPOFOL ;  Surgeon: Jinny Carmine, MD;  Location: [facility];  Service: Endoscopy;  Laterality: N/A;   JOINT REPLACEMENT Left 02/2016   partial knee   REPLACEMENT TOTAL KNEE Right 03/2023    TONSILLECTOMY     TUBAL LIGATION     Current Outpatient Medications  Medication Sig Dispense Refill   acetaminophen  (TYLENOL ) 500 MG tablet Take 2 tablets (1,000 mg total) by mouth every 6 (six) hours as needed for mild pain.     aspirin  EC 81 MG tablet Take 1 tablet (81 mg total) by mouth daily. Swallow whole.     Calcium  Carbonate-Vitamin D  600-400 MG-UNIT tablet Take 1 tablet by mouth daily.     Cholecalciferol 25 MCG (1000 UT) tablet Take 1,000 Units by mouth daily.     citalopram  (CELEXA ) 10 MG tablet Take 1 tablet (10 mg total) by mouth at bedtime. 90 tablet 1   Evolocumab  (REPATHA  SURECLICK) 140 MG/ML SOAJ Inject 140 mg into the skin every 14 (fourteen) days. 6 mL 3   ezetimibe  (ZETIA ) 10 MG tablet Take 1 tablet (10 mg total) by mouth at bedtime. 90 tablet 1   ibuprofen  (ADVIL ) 200 MG tablet Take 400 mg by mouth every 6 (six) hours as needed.     levothyroxine  (SYNTHROID ) 50 MCG tablet Take 1 tablet (50 mcg total) by mouth daily before breakfast. 90 tablet 1   nitroGLYCERIN  (NITROSTAT ) 0.4 MG SL tablet Place 1 tablet (0.4 mg total) under the tongue every 5 (five) minutes as needed for chest pain. 30 tablet 1   Omega-3 Fatty Acids (FISH OIL PO) Take by mouth.     pantoprazole  (PROTONIX ) 40 MG tablet Take 1 tablet (40 mg total) by mouth daily. LAST REFILL SCHEDULE OFFICE VISIT 90 tablet 0   potassium chloride (KLOR-CON) 10 MEQ tablet Take 10 mEq by mouth at bedtime. Every other day for leg cramps OTC     pravastatin  (PRAVACHOL ) 80 MG tablet Take 1 tablet (80 mg total) by mouth daily. 90 tablet 1   No current facility-administered medications for this visit.    Allergies:   Atorvastatin , Augmentin [amoxicillin-pot clavulanate], Rosuvastatin, Wound dressing adhesive, Clavulanic acid, and Lisinopril   Social History:  The patient  reports that she quit smoking about 26 years ago. Her smoking use included cigarettes. She has been exposed to tobacco smoke. She has never used smokeless  tobacco. She reports current alcohol use of about 5.0 standard drinks of alcohol per week. She reports that she does not use drugs.   Family History:   family history includes Aortic aneurysm (age of onset: 49) in her mother; Heart disease in her father.    Review of Systems: Review of Systems  Constitutional: Negative.   HENT: Negative.    Respiratory: Negative.    Cardiovascular: Negative.   Gastrointestinal: Negative.   Musculoskeletal: Negative.   Neurological: Negative.   Psychiatric/Behavioral: Negative.    All other systems reviewed and are negative.   PHYSICAL EXAM: VS:  BP (!) 140/70 (BP Location: Left Arm, Patient Position: Sitting, Cuff Size: Normal)   Pulse 65   Ht 5' 4 (1.626 m)   Wt 196 lb 6 oz (89.1 kg)   SpO2 92%   BMI 33.71 kg/m  , BMI Body mass index is 33.71 kg/m. GEN: Well nourished, well developed, in no acute distress HEENT: normal Neck: no JVD,  carotid bruits, or masses Cardiac: RRR; no murmurs, rubs, or gallops,no edema  Respiratory:  clear to auscultation bilaterally, normal work of breathing GI: soft, nontender, nondistended, + BS MS: no deformity or atrophy Skin: warm and dry, no rash Neuro:  Strength and sensation are intact Psych: euthymic mood, full affect    Recent Labs: 06/07/2024: ALT 17; BUN 21; Creatinine, Ser 1.17; Hemoglobin 12.5; Platelets 207; Potassium 4.3; Sodium 139; TSH 3.270    Lipid Panel Lab Results  Component Value Date   CHOL 116 06/07/2024   HDL 62 06/07/2024   LDLCALC 33 06/07/2024   TRIG 120 06/07/2024      Wt Readings from Last 3 Encounters:  10/11/24 196 lb 6 oz (89.1 kg)  06/07/24 193 lb (87.5 kg)  04/09/24 194 lb 6.4 oz (88.2 kg)       ASSESSMENT AND PLAN:  Problem List Items Addressed This Visit       Cardiology Problems   Mixed hyperlipidemia (Chronic)   CAD (coronary artery disease) - Primary   Relevant Orders   EKG 12-Lead (Completed)   Nonrheumatic aortic (valve) insufficiency    Relevant Orders   EKG 12-Lead (Completed)     Other   Anemia due to stage 3a chronic kidney disease (HCC)    Chest pain Long history of chest pain with prior workup including several cardiac CTA's -Nonobstructive disease noted, medical management previously recommended - Reports symptoms seem to present on an annual basis around the spring time, unclear association with stress/anxiety - Recommend she take nitro sublingual for recurrent chest pain symptoms  Hyperlipidemia Cholesterol is at goal on the current lipid regimen. No changes to the medications were made. Tolerating pravastatin  Repatha  Zetia   Essential hypertension Blood pressure is well controlled on today's visit. No changes made to the medications.  Aortic valve disorder No significant murmur on exam Likely of little clinical significance at this time  Anxiety Reports when she has stress that she we will take her mind off it by playing computer games  Signed, Velinda Lunger, M.D., Ph.D. [facility] [place], [place] [phone]

## 2024-10-11 ENCOUNTER — Ambulatory Visit: Admitting: Cardiovascular Disease

## 2024-10-11 ENCOUNTER — Encounter: Payer: Self-pay | Admitting: Cardiovascular Disease

## 2024-10-11 VITALS — BP 140/70 | HR 65 | Ht 64.0 in | Wt 196.4 lb

## 2024-10-11 DIAGNOSIS — E782 Mixed hyperlipidemia: Secondary | ICD-10-CM

## 2024-10-11 DIAGNOSIS — D631 Anemia in chronic kidney disease: Secondary | ICD-10-CM

## 2024-10-11 DIAGNOSIS — N1831 Chronic kidney disease, stage 3a: Secondary | ICD-10-CM | POA: Diagnosis not present

## 2024-10-11 DIAGNOSIS — I25118 Atherosclerotic heart disease of native coronary artery with other forms of angina pectoris: Secondary | ICD-10-CM | POA: Diagnosis not present

## 2024-10-11 DIAGNOSIS — I351 Nonrheumatic aortic (valve) insufficiency: Secondary | ICD-10-CM | POA: Diagnosis not present

## 2024-10-11 MED ORDER — NITROGLYCERIN 0.4 MG SL SUBL: 0.4000 mg | SUBLINGUAL_TABLET | SUBLINGUAL | 3 refills | Status: AC | PRN

## 2024-10-11 NOTE — Patient Instructions (Signed)
 Cancel [employer] hotel   Medication Instructions:  No changes  If you need a refill on your cardiac medications before your next appointment, please call your pharmacy.   Lab work: No new labs needed  Testing/Procedures: No new testing needed  Follow-Up: At [facility], you and your health needs are our priority.  As part of our continuing mission to provide you with exceptional heart care, we have created designated Provider Care Teams.  These Care Teams include your primary Cardiologist (physician) and Advanced Practice Providers (APPs -  Physician Assistants and Nurse Practitioners) who all work together to provide you with the care you need, when you need it.  You will need a follow up appointment in 12 months  Providers on your designated Care Team:   Lonni Meager, NP Bernardino Bring, PA-C [place], [place]  COVID-19 Vaccine Information can be found at: [email or web address] For questions related to vaccine distribution or appointments, please email [email or web address] or call [phone].

## 2024-10-28 ENCOUNTER — Telehealth: Payer: Self-pay | Admitting: Cardiovascular Disease

## 2024-10-28 NOTE — Telephone Encounter (Signed)
 Pt c/o medication issue:  1. Name of Medication:   Evolocumab  (REPATHA  SURECLICK) 140 MG/ML SOAJ   2. How are you currently taking this medication (dosage and times per day)?   As prescribed  3. Are you having a reaction (difficulty breathing--STAT)?   4. What is your medication issue?   Patient stated she has been getting a grant for this medication and wants to re-apply for the grant to get this medication at a reduced cost.

## 2024-10-29 ENCOUNTER — Telehealth: Payer: Self-pay | Admitting: Pharmacy Technician

## 2024-10-29 NOTE — Telephone Encounter (Signed)
 Grant valid until   Effective: 01/24/24 - 01/22/25   Can reapply in April.. notified patient

## 2024-12-08 ENCOUNTER — Encounter: Admitting: Student

## 2024-12-09 ENCOUNTER — Ambulatory Visit: Admitting: Gastroenterology

## 2025-02-16 ENCOUNTER — Ambulatory Visit
